# Patient Record
Sex: Male | Born: 1955 | Race: Black or African American | Hispanic: No | Smoking: Former smoker
Health system: Southern US, Community
[De-identification: ages and names within clinical notes are randomized; demographics above are authoritative.]

## PROBLEM LIST (undated history)

## (undated) DIAGNOSIS — M545 Low back pain: Secondary | ICD-10-CM

## (undated) DIAGNOSIS — D126 Benign neoplasm of colon, unspecified: Secondary | ICD-10-CM

## (undated) DIAGNOSIS — E785 Hyperlipidemia, unspecified: Secondary | ICD-10-CM

## (undated) DIAGNOSIS — E119 Type 2 diabetes mellitus without complications: Secondary | ICD-10-CM

## (undated) DIAGNOSIS — I10 Essential (primary) hypertension: Secondary | ICD-10-CM

## (undated) DIAGNOSIS — H269 Unspecified cataract: Secondary | ICD-10-CM

## (undated) DIAGNOSIS — Z5189 Encounter for other specified aftercare: Secondary | ICD-10-CM

## (undated) DIAGNOSIS — M1990 Unspecified osteoarthritis, unspecified site: Secondary | ICD-10-CM

## (undated) DIAGNOSIS — E039 Hypothyroidism, unspecified: Secondary | ICD-10-CM

## (undated) DIAGNOSIS — D649 Anemia, unspecified: Secondary | ICD-10-CM

## (undated) DIAGNOSIS — K219 Gastro-esophageal reflux disease without esophagitis: Secondary | ICD-10-CM

## (undated) DIAGNOSIS — M109 Gout, unspecified: Secondary | ICD-10-CM

## (undated) HISTORY — DX: Hypothyroidism, unspecified: E03.9

## (undated) HISTORY — PX: ANKLE FRACTURE SURGERY: SHX122

## (undated) HISTORY — DX: Gastro-esophageal reflux disease without esophagitis: K21.9

## (undated) HISTORY — PX: COLONOSCOPY: SHX174

## (undated) HISTORY — PX: CYST EXCISION: SHX5701

## (undated) HISTORY — DX: Type 2 diabetes mellitus without complications: E11.9

## (undated) HISTORY — PX: ANKLE SURGERY: SHX546

## (undated) HISTORY — PX: FRACTURE SURGERY: SHX138

## (undated) HISTORY — DX: Unspecified cataract: H26.9

## (undated) HISTORY — DX: Encounter for other specified aftercare: Z51.89

## (undated) HISTORY — DX: Anemia, unspecified: D64.9

## (undated) HISTORY — DX: Benign neoplasm of colon, unspecified: D12.6

## (undated) HISTORY — DX: Low back pain: M54.5

## (undated) HISTORY — DX: Essential (primary) hypertension: I10

## (undated) HISTORY — DX: Hyperlipidemia, unspecified: E78.5

---

## 2010-12-02 ENCOUNTER — Inpatient Hospital Stay (INDEPENDENT_AMBULATORY_CARE_PROVIDER_SITE_OTHER)
Admission: RE | Admit: 2010-12-02 | Discharge: 2010-12-02 | Disposition: A | Discharge status: Home / Self Care | Payer: BC Managed Care – PPO | Source: Ambulatory Visit | Attending: Emergency Medicine | Admitting: Emergency Medicine

## 2010-12-02 DIAGNOSIS — R209 Unspecified disturbances of skin sensation: Secondary | ICD-10-CM

## 2010-12-02 DIAGNOSIS — I10 Essential (primary) hypertension: Secondary | ICD-10-CM

## 2010-12-06 ENCOUNTER — Emergency Department (HOSPITAL_COMMUNITY): Payer: BC Managed Care – PPO

## 2010-12-06 ENCOUNTER — Emergency Department (HOSPITAL_COMMUNITY)
Admission: EM | Admit: 2010-12-06 | Discharge: 2010-12-06 | Disposition: A | Discharge status: Home / Self Care | Payer: BC Managed Care – PPO | Attending: Emergency Medicine | Admitting: Emergency Medicine

## 2010-12-06 DIAGNOSIS — K921 Melena: Secondary | ICD-10-CM | POA: Insufficient documentation

## 2010-12-06 DIAGNOSIS — M545 Low back pain, unspecified: Secondary | ICD-10-CM | POA: Insufficient documentation

## 2010-12-06 DIAGNOSIS — Z8639 Personal history of other endocrine, nutritional and metabolic disease: Secondary | ICD-10-CM | POA: Insufficient documentation

## 2010-12-06 DIAGNOSIS — I10 Essential (primary) hypertension: Secondary | ICD-10-CM | POA: Insufficient documentation

## 2010-12-06 DIAGNOSIS — R5381 Other malaise: Secondary | ICD-10-CM | POA: Insufficient documentation

## 2010-12-06 DIAGNOSIS — K5900 Constipation, unspecified: Secondary | ICD-10-CM | POA: Insufficient documentation

## 2010-12-06 DIAGNOSIS — E785 Hyperlipidemia, unspecified: Secondary | ICD-10-CM | POA: Insufficient documentation

## 2010-12-06 DIAGNOSIS — Z79899 Other long term (current) drug therapy: Secondary | ICD-10-CM | POA: Insufficient documentation

## 2010-12-06 DIAGNOSIS — Z862 Personal history of diseases of the blood and blood-forming organs and certain disorders involving the immune mechanism: Secondary | ICD-10-CM | POA: Insufficient documentation

## 2010-12-06 DIAGNOSIS — K648 Other hemorrhoids: Secondary | ICD-10-CM | POA: Insufficient documentation

## 2010-12-06 DIAGNOSIS — R51 Headache: Secondary | ICD-10-CM | POA: Insufficient documentation

## 2010-12-06 DIAGNOSIS — R209 Unspecified disturbances of skin sensation: Secondary | ICD-10-CM | POA: Insufficient documentation

## 2010-12-06 LAB — DIFFERENTIAL
Eosinophils Absolute: 0.4 10*3/uL (ref 0.0–0.7)
Eosinophils Relative: 3 % (ref 0–5)
Lymphs Abs: 3.9 10*3/uL (ref 0.7–4.0)
Monocytes Relative: 4 % (ref 3–12)

## 2010-12-06 LAB — CBC
MCH: 29.1 pg (ref 26.0–34.0)
MCV: 85.8 fL (ref 78.0–100.0)
Platelets: 265 10*3/uL (ref 150–400)
RDW: 15 % (ref 11.5–15.5)

## 2010-12-06 LAB — BASIC METABOLIC PANEL
Calcium: 9.5 mg/dL (ref 8.4–10.5)
Creatinine, Ser: 1.15 mg/dL (ref 0.50–1.35)
GFR calc Af Amer: 82 mL/min — ABNORMAL LOW (ref >90)

## 2010-12-06 MED ORDER — GADOBENATE DIMEGLUMINE 529 MG/ML IV SOLN
20.0000 mL | Freq: Once | INTRAVENOUS | Status: AC
  Administered 2010-12-06: 20 mL via INTRAVENOUS

## 2011-09-07 ENCOUNTER — Other Ambulatory Visit: Payer: Self-pay | Admitting: Physician Assistant

## 2011-09-07 ENCOUNTER — Ambulatory Visit
Admission: RE | Admit: 2011-09-07 | Discharge: 2011-09-07 | Disposition: A | Discharge status: Home / Self Care | Payer: BC Managed Care – PPO | Source: Ambulatory Visit | Attending: Physician Assistant | Admitting: Physician Assistant

## 2011-09-07 DIAGNOSIS — R52 Pain, unspecified: Secondary | ICD-10-CM

## 2013-05-13 ENCOUNTER — Encounter (HOSPITAL_COMMUNITY): Payer: Self-pay | Admitting: Emergency Medicine

## 2013-05-13 ENCOUNTER — Emergency Department (HOSPITAL_COMMUNITY): Payer: BC Managed Care – PPO

## 2013-05-13 ENCOUNTER — Emergency Department (HOSPITAL_COMMUNITY)
Admission: EM | Admit: 2013-05-13 | Discharge: 2013-05-13 | Disposition: A | Discharge status: Home / Self Care | Payer: Self-pay | Attending: Emergency Medicine | Admitting: Emergency Medicine

## 2013-05-13 DIAGNOSIS — I10 Essential (primary) hypertension: Secondary | ICD-10-CM | POA: Insufficient documentation

## 2013-05-13 DIAGNOSIS — N508 Other specified disorders of male genital organs: Secondary | ICD-10-CM | POA: Insufficient documentation

## 2013-05-13 DIAGNOSIS — N503 Cyst of epididymis: Secondary | ICD-10-CM

## 2013-05-13 DIAGNOSIS — Z87891 Personal history of nicotine dependence: Secondary | ICD-10-CM | POA: Insufficient documentation

## 2013-05-13 DIAGNOSIS — G8929 Other chronic pain: Secondary | ICD-10-CM | POA: Insufficient documentation

## 2013-05-13 DIAGNOSIS — M549 Dorsalgia, unspecified: Secondary | ICD-10-CM | POA: Insufficient documentation

## 2013-05-13 DIAGNOSIS — M109 Gout, unspecified: Secondary | ICD-10-CM | POA: Insufficient documentation

## 2013-05-13 DIAGNOSIS — Z79899 Other long term (current) drug therapy: Secondary | ICD-10-CM | POA: Insufficient documentation

## 2013-05-13 HISTORY — DX: Essential (primary) hypertension: I10

## 2013-05-13 HISTORY — DX: Unspecified osteoarthritis, unspecified site: M19.90

## 2013-05-13 HISTORY — DX: Gout, unspecified: M10.9

## 2013-05-13 LAB — URINE MICROSCOPIC-ADD ON

## 2013-05-13 LAB — URINALYSIS, ROUTINE W REFLEX MICROSCOPIC
BILIRUBIN URINE: NEGATIVE
Glucose, UA: NEGATIVE mg/dL
HGB URINE DIPSTICK: NEGATIVE
Ketones, ur: NEGATIVE mg/dL
Nitrite: NEGATIVE
PH: 5.5 (ref 5.0–8.0)
Protein, ur: NEGATIVE mg/dL
SPECIFIC GRAVITY, URINE: 1.027 (ref 1.005–1.030)
UROBILINOGEN UA: 1 mg/dL (ref 0.0–1.0)

## 2013-05-13 MED ORDER — HYDROCODONE-ACETAMINOPHEN 5-325 MG PO TABS
1.0000 | ORAL_TABLET | Freq: Once | ORAL | Status: AC
  Administered 2013-05-13: 1 via ORAL
  Filled 2013-05-13: qty 1

## 2013-05-13 MED ORDER — HYDROCODONE-ACETAMINOPHEN 5-325 MG PO TABS: 1.0000 | ORAL_TABLET | Freq: Four times a day (QID) | ORAL | Status: DC | PRN

## 2013-05-13 NOTE — ED Provider Notes (Signed)
CSN: [id number]     Arrival date & time 05/13/13  1022 History   First MD Initiated Contact with Patient 05/13/13 1316     Chief Complaint  Patient presents with  . Groin Swelling  . Back Pain     (Consider location/radiation/quality/duration/timing/severity/associated sxs/prior Treatment) Patient is a 58 y.o. male presenting with back pain.  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   He has also had 3 months of moderate aching pain to L testicle, tender to palpation and feels swollen. He had a 'cyst' removed from R testicle in the past. Denies trauma, dysuria, hematuria, penile discharge or sores.   Past Medical History  Diagnosis Date  . Hypertension   . Gout   . Arthritis    Past Surgical History  Procedure Laterality Date  . Cyst excision    . Fracture surgery     No family history on file. History  Substance Use Topics  . Smoking status: Former Smoker    Quit date: 05/14/1983  . Smokeless tobacco: Not on file  . Alcohol Use: Yes    Review of Systems  Musculoskeletal: Positive for back pain.   All other systems reviewed and are negative except as noted in HPI.     Allergies  Review of patient's allergies indicates no known allergies.  Home Medications   Current Outpatient Rx  Name  Route  Sig  Dispense  Refill  . allopurinol (ZYLOPRIM) 300 MG tablet   Oral   Take 300 mg by mouth daily.         Marland Kitchen amLODipine (NORVASC) 5 MG tablet   Oral   Take 5 mg by mouth daily.         . Aspirin-Salicylamide-Caffeine (BC HEADACHE POWDER PO)   Oral   Take 1-2 packets by mouth 3 (three) times daily as needed (for pain).         Marland Kitchen levothyroxine (SYNTHROID, LEVOTHROID) 150 MCG tablet   Oral   Take 150 mcg by mouth daily before breakfast.         .  lisinopril (PRINIVIL,ZESTRIL) 20 MG tablet   Oral   Take 20 mg by mouth daily.         . meloxicam (MOBIC) 15 MG tablet   Oral   Take 15 mg by mouth daily.         . metFORMIN (GLUCOPHAGE) 500 MG tablet   Oral   Take 500 mg by mouth 2 (two) times daily with a meal.         . vitamin B-12 (CYANOCOBALAMIN) 1000 MCG tablet   Oral   Take 1,000 mcg by mouth daily.          BP 157/102  Pulse 75  Temp(Src) 98.2 F (36.8 C) (Oral)  Resp 20  SpO2 98% Physical Exam  Nursing note and vitals reviewed. Constitutional: He is oriented to person, place, and time. He appears well-developed and well-nourished.  HENT:  Head: Normocephalic and atraumatic.  Eyes: EOM are normal. Pupils are equal, round, and reactive to light.  Neck: Normal range of motion. Neck supple.  Cardiovascular: Normal rate, normal heart sounds and intact distal pulses.   Pulmonary/Chest: Effort normal and breath sounds normal.  Abdominal: Bowel sounds are normal. He exhibits no distension. There is no tenderness.  Genitourinary:  No lymphadenopathy, no penile discharge or sores, he has mild swelling and tenderness just superior to L testicle, ?varicocele vs epididymitis.   Musculoskeletal: Normal range of motion. He exhibits no edema and no tenderness.  Neurological: He is alert and oriented to person, place, and time. He has normal strength. No cranial nerve deficit or sensory deficit.  Skin: Skin is warm and dry. No rash noted.  Psychiatric: He has a normal mood and affect.    ED Course  Procedures (including critical care time) Labs Review Labs Reviewed  URINALYSIS, ROUTINE W REFLEX MICROSCOPIC - Abnormal; Notable for the following:    Leukocytes, UA TRACE (*)    All other components within normal limits  URINE MICROSCOPIC-ADD ON   Imaging Review US Scrotum  05/13/2013   CLINICAL DATA:  testicular pain, L>R  EXAM: SCROTAL ULTRASOUND  DOPPLER ULTRASOUND OF THE TESTICLES  TECHNIQUE: Complete ultrasound  examination of the testicles, epididymis, and other scrotal structures was performed. Color and spectral Doppler ultrasound were also utilized to evaluate blood flow to the testicles.  COMPARISON:  None.  FINDINGS: Right testicle  Measurements: 4.9 x 2 x 3.6 cm. No mass or microlithiasis visualized.  Left testicle  Measurements: 4.3 x 1.9 x 3.3 cm. No mass or microlithiasis visualized.  Right epididymis:  Normal in size and appearance.  Left epididymis: A 0.5 x 0.3 x 0.5 cm small cystic focus is identified within the left epididymis. Internal echoes with a layering appearance are appreciated, there is no appreciable vascularity associated with this finding. Differential considerations are is small complex cyst versus a small spermatocele. The left epididymis is otherwise unremarkable.  Hydrocele: Bilateral hydroceles are identified small to moderate on the right, small on the left.  Varicocele:  A varicocele is elicited on the right.  Pulsed Doppler interrogation of both testes demonstrates low resistance arterial and venous waveforms bilaterally.  IMPRESSION: 1. Small cystic appearing focus within the left epididymis differential considerations are a small partially complex cyst versus a small spermatocele. 2. Bilateral hydroceles right greater than left 3. Varicocele appreciated on the right. 4. No further sonographic abnormalities.   Electronically Signed   By: Margaree Mackintosh M.D.   On: 05/13/2013 14:58   [place] Art/ven Flow Abd Pelv Doppler  05/13/2013   CLINICAL DATA:  testicular pain, L>R  EXAM: SCROTAL ULTRASOUND  DOPPLER ULTRASOUND OF THE TESTICLES  TECHNIQUE: Complete ultrasound examination of the testicles, epididymis, and other scrotal structures was performed. Color and spectral Doppler ultrasound were also utilized to evaluate blood flow to the testicles.  COMPARISON:  None.  FINDINGS: Right testicle  Measurements: 4.9 x 2 x 3.6 cm. No mass or microlithiasis visualized.  Left testicle  Measurements: 4.3  x 1.9 x 3.3 cm. No mass or microlithiasis visualized.  Right epididymis:  Normal in size and appearance.  Left epididymis: A 0.5 x 0.3 x 0.5 cm small cystic focus is identified within the left epididymis. Internal echoes with a layering appearance are appreciated, there is no appreciable vascularity associated with this finding. Differential considerations are is small complex cyst versus a small spermatocele. The left epididymis is otherwise unremarkable.  Hydrocele: Bilateral hydroceles are identified small to moderate on the right, small on the left.  Varicocele:  A varicocele is elicited on the right.  Pulsed Doppler interrogation of both testes demonstrates low resistance arterial and venous waveforms bilaterally.  IMPRESSION: 1. Small cystic appearing focus within the left epididymis differential considerations are a small partially complex cyst versus a  small spermatocele. 2. Bilateral hydroceles right greater than left 3. Varicocele appreciated on the right. 4. No further sonographic abnormalities.   Electronically Signed   By: Margaree Mackintosh M.D.   On: 05/13/2013 14:58     EKG Interpretation None      MDM   Final diagnoses:  Chronic back pain  Epididymal cyst   Labs and imaging reviewed with patient. Advised Urology followup for cyst and varicocele. PCP followup for back pain.   Ruhani Umland B. Karle Starch, MD 05/13/13 [phone]

## 2013-05-13 NOTE — Discharge Instructions (Signed)
Back Pain, Adult Low back pain is very common. About 1 in 5 people have back pain.The cause of low back pain is rarely dangerous. The pain often gets better over time.About half of people with a sudden onset of back pain feel better in just 2 weeks. About 8 in 10 people feel better by 6 weeks.  CAUSES Some common causes of back pain include:  Strain of the muscles or ligaments supporting the spine.  Wear and tear (degeneration) of the spinal discs.  Arthritis.  Direct injury to the back. DIAGNOSIS Most of the time, the direct cause of low back pain is not known.However, back pain can be treated effectively even when the exact cause of the pain is unknown.Answering your caregiver's questions about your overall health and symptoms is one of the most accurate ways to make sure the cause of your pain is not dangerous. If your caregiver needs more information, he or she may order lab work or imaging tests (X-rays or MRIs).However, even if imaging tests show changes in your back, this usually does not require surgery. HOME CARE INSTRUCTIONS For many people, back pain returns.Since low back pain is rarely dangerous, it is often a condition that people can learn to [facility] their own.   Remain active. It is stressful on the back to sit or stand in one place. Do not sit, drive, or stand in one place for more than 30 minutes at a time. Take short walks on level surfaces as soon as pain allows.Try to increase the length of time you walk each day.  Do not stay in bed.Resting more than 1 or 2 days can delay your recovery.  Do not avoid exercise or work.Your body is made to move.It is not dangerous to be active, even though your back may hurt.Your back will likely heal faster if you return to being active before your pain is gone.  Pay attention to your body when you bend and lift. Many people have less discomfortwhen lifting if they bend their knees, keep the load close to their bodies,and  avoid twisting. Often, the most comfortable positions are those that put less stress on your recovering back.  Find a comfortable position to sleep. Use a firm mattress and lie on your side with your knees slightly bent. If you lie on your back, put a pillow under your knees.  Only take over-the-counter or prescription medicines as directed by your caregiver. Over-the-counter medicines to reduce pain and inflammation are often the most helpful.Your caregiver may prescribe muscle relaxant drugs.These medicines help dull your pain so you can more quickly return to your normal activities and healthy exercise.  Put ice on the injured area.  Put ice in a plastic bag.  Place a towel between your skin and the bag.  Leave the ice on for 15-20 minutes, 03-04 times a day for the first 2 to 3 days. After that, ice and heat may be alternated to reduce pain and spasms.  Ask your caregiver about trying back exercises and gentle massage. This may be of some benefit.  Avoid feeling anxious or stressed.Stress increases muscle tension and can worsen back pain.It is important to recognize when you are anxious or stressed and learn ways to manage it.Exercise is a great option. SEEK MEDICAL CARE IF:  You have pain that is not relieved with rest or medicine.  You have pain that does not improve in 1 week.  You have new symptoms.  You are generally not feeling well. SEEK  IMMEDIATE MEDICAL CARE IF:   You have pain that radiates from your back into your legs.  You develop new bowel or bladder control problems.  You have unusual weakness or numbness in your arms or legs.  You develop nausea or vomiting.  You develop abdominal pain.  You feel faint. Document Released: 02/12/2005 Document Revised: 08/14/2011 Document Reviewed: 07/03/2010 [facility] Patient Information 2014 [place], [place].  Scrotal Masses Scrotal swelling is common in men of all ages. Common types of testicular masses include:    Hydrocele. The most common benign testicular mass in an adult. Hydroceles are generally soft and painless collections of fluid in the scrotal sac. These can rapidly change size as the fluid enters or leaves. Hydroceles can be associated with an underlying cancer of the testicle.  Spermatoceles. Generally soft and painless cyst-like masses in the scrotum that contain fluid, usually above the testicle. They can rapidly change size as the fluid enters or leaves. They are more prominent while standing or exercising. Sometimes, spermatoceles may cause a sensation of heaviness or a dull ache.  Orchitis. Inflammation of the testicle. It is painful and may be associated with a fever or symptoms of a urinary tract infection, including frequent and painful urination. It is common in males who have the mumps.  Varicocele. An enlargement of the veins that drain the testicles. Varicoceles usually occur on the left side of the scrotum. This condition can increase the risk of infertility. Varicocele is sometimes more prominent while standing or exercising. Sometimes, varicoceles may cause a sensation of heaviness or a dull ache.  Inguinal hernia. A bulge caused by a portion of intestine protruding into the scrotum through a weak area in the abdominal muscles. Hernias may or may not be painful. They are soft and usually enlarge with coughing or straining.  Torsion of the testis. This can cause a testicular mass that develops quickly and is associated with tenderness or fever, or both. It is caused by a twisting of the testicle within the sac. It also reduces the blood supply and can destroy the testis if not treated quickly with surgery.  Epididymitis. Inflammation of the epididymis (a structure attached above and behind the testicle), usually caused by a urinary tract infection or a sexually transmitted infection. This generally shows up as testicular discomfort and swelling and may include pain during urination.  It is frequently associated with a testicle infection.  Testicular appendages. Remnants of tissue on the testis present since birth. A testicular appendage can twist on its blood supply and cause pain. In most cases, this is seen as a blue dot on the scrotum.  Hematocele. A collection of blood between the layers of the sac inside the scrotum. It usually is caused by trauma to the scrotum.  Sebaceous cysts. These can be a swelling in the skin of the scrotum and are usually painless.  Cancer (carcinoma) of the skin of the scrotum. It can cause scrotal swelling, but this is rare. Document Released: 08/19/2002 Document Revised: 10/15/2012 Document Reviewed: 08/04/2012 [facility] Patient Information 2014 [place].

## 2013-05-13 NOTE — ED Notes (Addendum)
Pt states he's had lower back pain and testicle swelling "for a while".  Pt doesn't give a time frame, but states it's been going on a while.  Pt sees a PCP for these same symptoms.  Pt given Rx for Meloxicam, but no relief of back pain.  Pt states PCP knows about his testicle swelling, but has not addressed it.  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

## 2013-05-13 NOTE — ED Notes (Signed)
Pt states he is allergic to a pain pill, but does not remember what it is.

## 2013-05-24 ENCOUNTER — Emergency Department (HOSPITAL_COMMUNITY)
Admission: EM | Admit: 2013-05-24 | Discharge: 2013-05-24 | Disposition: A | Discharge status: Home / Self Care | Payer: No Typology Code available for payment source | Source: Home / Self Care | Attending: Family Medicine | Admitting: Family Medicine

## 2013-05-24 ENCOUNTER — Emergency Department (INDEPENDENT_AMBULATORY_CARE_PROVIDER_SITE_OTHER): Payer: No Typology Code available for payment source

## 2013-05-24 ENCOUNTER — Other Ambulatory Visit (HOSPITAL_COMMUNITY)
Admission: RE | Admit: 2013-05-24 | Discharge: 2013-05-24 | Disposition: A | Discharge status: Home / Self Care | Payer: No Typology Code available for payment source | Source: Ambulatory Visit | Attending: Family Medicine | Admitting: Family Medicine

## 2013-05-24 DIAGNOSIS — M79606 Pain in leg, unspecified: Secondary | ICD-10-CM

## 2013-05-24 DIAGNOSIS — Z113 Encounter for screening for infections with a predominantly sexual mode of transmission: Secondary | ICD-10-CM | POA: Insufficient documentation

## 2013-05-24 DIAGNOSIS — N509 Disorder of male genital organs, unspecified: Secondary | ICD-10-CM

## 2013-05-24 DIAGNOSIS — M79609 Pain in unspecified limb: Secondary | ICD-10-CM

## 2013-05-24 DIAGNOSIS — M549 Dorsalgia, unspecified: Secondary | ICD-10-CM

## 2013-05-24 DIAGNOSIS — N50819 Testicular pain, unspecified: Secondary | ICD-10-CM

## 2013-05-24 MED ORDER — CEFTRIAXONE SODIUM 250 MG IJ SOLR
250.0000 mg | Freq: Once | INTRAMUSCULAR | Status: AC
  Administered 2013-05-24: 250 mg via INTRAMUSCULAR

## 2013-05-24 MED ORDER — LIDOCAINE HCL (PF) 1 % IJ SOLN
INTRAMUSCULAR | Status: AC
  Filled 2013-05-24: qty 5

## 2013-05-24 MED ORDER — AZITHROMYCIN 250 MG PO TABS
ORAL_TABLET | ORAL | Status: AC
  Filled 2013-05-24: qty 4

## 2013-05-24 MED ORDER — AZITHROMYCIN 250 MG PO TABS
1000.0000 mg | ORAL_TABLET | Freq: Once | ORAL | Status: AC
  Administered 2013-05-24: 1000 mg via ORAL

## 2013-05-24 MED ORDER — CEFTRIAXONE SODIUM 250 MG IJ SOLR
INTRAMUSCULAR | Status: AC
  Filled 2013-05-24: qty 250

## 2013-05-24 MED ORDER — HYDROCODONE-ACETAMINOPHEN 5-325 MG PO TABS: 1.0000 | ORAL_TABLET | Freq: Four times a day (QID) | ORAL | Status: DC | PRN

## 2013-05-24 NOTE — ED Provider Notes (Signed)
Ray Garner is a 58 y.o. male who presents to Urgent Care today for back pain, leg pain, and scrotum pain. Patient has multiple issues. 1) back pain ongoing for months. Pain is present in the low back radiating to the Botox bilaterally. No weakness or numbness. He has tried multiple over-the-counter medications which have not helped. He has an appointment with a new provider care Dr. in May.  No fevers or chills nausea vomiting or diarrhea. No significant trouble walking or bowel bladder dysfunction. 2) testicle pain: Patient is left-sided testicle pain. This is been ongoing for several weeks. He was seen in the emergency room and had an ultrasound which showed a cystic structure. He continues to have severe discomfort. He denies any penile discharge. He was asked to followup with a urologist however he does not have health insurance and has been unable to do so. 3) leg pain: Patient has bilateral calf pain that is worse with exertion and better with rest. He can walk approximately 100 feet before he has to rest for a few minutes. He denies any significant rest pain.   Past Medical History  Diagnosis Date  . Hypertension   . Gout   . Arthritis    History  Substance Use Topics  . Smoking status: Former Smoker    Quit date: 05/14/1983  . Smokeless tobacco: Not on file  . Alcohol Use: Yes   ROS as above Medications: No current facility-administered medications for this encounter.   Current Outpatient Prescriptions  Medication Sig Dispense Refill  . allopurinol (ZYLOPRIM) 300 MG tablet Take 300 mg by mouth daily.      Marland Kitchen amLODipine (NORVASC) 5 MG tablet Take 5 mg by mouth daily.      . Aspirin-Salicylamide-Caffeine (BC HEADACHE POWDER PO) Take 1-2 packets by mouth 3 (three) times daily as needed (for pain).      Marland Kitchen HYDROcodone-acetaminophen (NORCO/VICODIN) 5-325 MG per tablet Take 1-2 tablets by mouth every 6 (six) hours as needed for moderate pain.  15 tablet  0  . levothyroxine (SYNTHROID,  LEVOTHROID) 150 MCG tablet Take 150 mcg by mouth daily before breakfast.      . lisinopril (PRINIVIL,ZESTRIL) 20 MG tablet Take 20 mg by mouth daily.      . meloxicam (MOBIC) 15 MG tablet Take 15 mg by mouth daily.      . metFORMIN (GLUCOPHAGE) 500 MG tablet Take 500 mg by mouth 2 (two) times daily with a meal.      . vitamin B-12 (CYANOCOBALAMIN) 1000 MCG tablet Take 1,000 mcg by mouth daily.        Exam:  BP 146/98  Pulse 73  Temp(Src) 98.3 F (36.8 C) (Oral)  Resp 16  SpO2 100% Gen: Well NAD HEENT: EOMI,  MMM Lungs: Normal work of breathing. CTABL Heart: RRR no MRG Abd: NABS, Soft. NT, ND Exts: Brisk capillary refill, warm and well perfused.  Back: Nontender to spinal midline. Normal back range of motion. Negative straight leg raise test Corky Sox test and Fadir test bilaterally. Lower extremity strength is intact throughout. Capillary refill pulses and reflexes are normal. Normal gait Posterior tibialis and dorsal pedis pulses are intact bilaterally. Genitals: Testicles descended bilaterally. Left testicle is diffusely very mildly tender with no masses palpated. No nodules noted. No penile discharge.  No results found for this or any previous visit (from the past 24 hour(s)). Dg Lumbar Spine Complete  05/24/2013   CLINICAL DATA:  Low back pain.  EXAM: LUMBAR SPINE - COMPLETE 4+ VIEW  COMPARISON:  09/07/2011  FINDINGS: There is an old compression fracture involving the superior endplate of Z61. Alignment of the lumbar spine is normal. There is mild disc space narrowing at L5-S1. No evidence for a pars defect. Oval-shaped density along the right side of the L3 vertebral body on the AP view is probably within bowel.  IMPRESSION: No acute bone abnormality.  Old T12 compression fracture.  Mild disc space narrowing at L5-S1.   Electronically Signed   By: Markus Daft M.D.   On: 05/24/2013 14:38    Assessment and Plan: 58 y.o. male with  1) back pain: Likely DDD related. Patient may have a  radicular component as well. He's had steroid treatments in the past which helped. Ultimately he'll need to lose weight and work on back exercises. Followup with primary care provider. Short prescription of Norco. Patient was informed that we'll not continue provide pain medications at this location.  2) leg pain: The patient's history is concerning for claudication. He does have good pulses bilaterally. I placed an order for a lower extremity vascular ultrasound. 3) testicle pain: Ultrasound showed a cyst recently. Urine cytology for gonorrhea Chlamydia Trichomonas is pending.  Empiric Treatment with ceftriaxone and azithromycin.  Discussed warning signs or symptoms. Please see discharge instructions. Patient expresses understanding.    Gregor Hams, MD 05/24/13 [phone]

## 2013-05-24 NOTE — Discharge Instructions (Signed)
Thank you for coming in today. You should be called tomorrow about scheduling an ultrasound for her lower legs. If you do not hear from them please call hospital [phone]) and ask to speak to vascular ultrasound scheduling.  Please call or see Ms Cheryle Horsfall for assistance with your bill.  You may qualify for reduced or free services.  Her phone number is [phone]. Her email is [email or web address]  We can try to get you referred to [facility].  Come back or go to the emergency room if you notice new weakness new numbness problems walking or bowel or bladder problems.   Intermittent Claudication Blockage of leg arteries results from poor circulation of blood in the leg arteries. This produces an aching, tired, and sometimes burning pain in the legs that is brought on by exercise and made better by rest. Claudication refers to the limping that happens from leg cramps. It is also referred to as Vaso-occlusive disease of the legs, arterial insufficiency of the legs, recurrent leg pain, recurrent leg cramping and calf pain with exercise.  CAUSES  This condition is due to narrowing or blockage of the arteries (muscular vessels which carry blood away from the heart and around the body). Blockage of arteries can occur anywhere in the body. If they occur in the heart, a person may experience angina (chest pain) or even a heart attack. If they occur in the neck or the brain, a person may have a stroke. Intermittent claudication is when the blockage occurs in the legs, most commonly in the calf or the foot.  Atherosclerosis, or blockage of arteries, can occur for many reasons. Some of these are smoking, diabetes, and high cholesterol. SYMPTOMS  Intermittent claudication may occur in both legs, and it often continues to get worse over time. However, some people complain only of weakness in the legs when walking, or a feeling of "tiredness" in the buttocks. Impotence (not able  to have an erection) is an occasional complaint in men. Pain while resting is uncommon.  WHAT TO EXPECT AT [facility] PROVIDER'S OFFICE: Your medical history will be asked for and a physical examination will be performed. Medical history questions documenting claudication in detail may include:   Time pattern  Do you have leg cramps at night (nocturnal cramps)?  How often does leg pain with cramping occur?  Is it getting worse?  What is the quality of the pain?  Is the pain sharp?  Is there an aching pain with the cramps?  Aggravating factors  Is it worse after you exercise?  Is it worse after you are standing for a while?  Do you smoke? How much?  Do you drink alcohol? How much?  Are you diabetic? How well is your blood sugar controlled?  Other  What other symptoms are also present?  Has there been impotence (men)?  Is there pain in the back?  Is there a darkening of the skin of the legs, feet or toes?  Is there weakness or paralysis of the legs? The physical examination may include evaluation of the femoral pulse (in the groin) and the other areas where the pulse can be felt in the legs. DIAGNOSIS  Diagnostic tests that may be performed include:  Blood pressure measured in arms and legs for comparison.  Doppler ultrasonography on the legs and the heart.  Duplex Doppler/ultrasound exam of extremity to visualize arterial blood flow.  ECG- to evaluate the activity of your heart.  Aortography- to visualize blockages in  your arteries. TREATMENT Surgical treatment may be suggested if claudication interferes with the patient's activities or work, and if the diseased arteries do not seem to be improving after treatment. Be aware that this condition can worsen over time and you should carefully monitor your condition. HOME CARE INSTRUCTIONS  Talk to your caregiver about the cause of your leg cramping and about what to do at home to relieve it.  A healthy  diet is important to lessen the likeliness of atherosclerosis.  A program of daily walking for short periods, and stopping for pain or cramping, may help improve function.  It is important to stop smoking.  Avoid putting hot or cold items on legs.  Avoid tight shoes. SEEK MEDICAL CARE IF: There are many other causes of leg pain such as arthritis or low blood potassium. However, some causes of leg pain may be life threatening such as a blood clot in the legs. Seek medical attention if you have:  Leg pain that does not go away.  Legs that may be red, hot or swollen.  Ulcers or sores appear on your ankle or foot.  Any chest pain or shortness of breath accompanying leg pain.  Diabetes.  You are pregnant. SEEK IMMEDIATE MEDICAL CARE IF:   Your leg pain becomes severe or will not go away.  Your foot turns blue or a dark color.  Your leg becomes red, hot or swollen or you develop a fever over 102F.  Any chest pain or shortness of breath accompanying leg pain. MAKE SURE YOU:   Understand these instructions.  Will watch your condition.  Will get help right away if you are not doing well or get worse. Document Released: 12/16/2003 Document Revised: 05/07/2011 Document Reviewed: 10/03/2007 [facility] Patient Information 2014 [place].

## 2013-05-24 NOTE — ED Notes (Signed)
Patient here for back pain and hip pain Has a cyst on his scrotum was seen recently in the ED for it States that gave him pain medications but stopped taking it because  It make him break out in hives

## 2013-05-25 LAB — URINE CYTOLOGY ANCILLARY ONLY
CHLAMYDIA, DNA PROBE: NEGATIVE
Neisseria Gonorrhea: NEGATIVE
Trichomonas: POSITIVE — AB

## 2013-05-26 ENCOUNTER — Telehealth (HOSPITAL_COMMUNITY): Payer: Self-pay | Admitting: *Deleted

## 2013-05-27 MED ORDER — METRONIDAZOLE 500 MG PO TABS: 500.0000 mg | ORAL_TABLET | Freq: Two times a day (BID) | ORAL | Status: DC

## 2013-05-27 NOTE — Telephone Encounter (Signed)
Message copied by Gregor Hams on Wed May 27, 2013  8:15 AM ------      Message from: Roselyn Meier      Created: Mon May 25, 2013 10:42 PM      Regarding: labs       Trich pos. Rest of labs neg.  Needs treatment.      Roselyn Meier      05/25/2013       ------

## 2013-05-27 NOTE — Telephone Encounter (Signed)
Message copied by Gregor Hams on Wed May 27, 2013  8:16 AM ------      Message from: Roselyn Meier      Created: Mon May 25, 2013 10:42 PM      Regarding: labs       Trich pos. Rest of labs neg.  Needs treatment.      Roselyn Meier      05/25/2013       ------

## 2013-05-27 NOTE — ED Notes (Signed)
Pt. Called back.  Pt. verified x 2 and given results.  Pt. Told he needs Flagyl for Trich and where to pick up his Rx.  Pt. instructed to no alcohol while taking this medication.  Pt. instructed  to notify his partner to be treated with Flagyl, no sex until he have finished his medication and his partner has been treated and to practice safe sex. Pt. told that he can get HIV testing at the [facility] clinic, by appointment. Pt.'s fiance called back and said he could not explain what he had.  She put him on the phone and he gave me permission to tell her.  I told her that he had Trich.  She said she know what that is and did not want any more information. Roselyn Meier 05/27/2013

## 2013-05-27 NOTE — ED Notes (Signed)
PT test pos for trich.  Flagyl Called in. RN to contact the pt.   Gregor Hams, MD 05/27/13 [phone]

## 2013-06-24 ENCOUNTER — Telehealth: Payer: Self-pay | Admitting: Internal Medicine

## 2013-06-24 NOTE — Telephone Encounter (Signed)
Pt called in today requesting an earlier appointment due to his feet being swollen and changing colors; Pt stated that he is diabeticand pt was transferred to nurse and was advised to come in for a visit however pt stated that they had to go to work; pt was then put in for a scheduled appointment 4/30 @ 2:00

## 2013-06-24 NOTE — Telephone Encounter (Signed)
Patient's wife called stating that husband has an appointment for [id number] to establish care. Patient's wife states her husband needs an earlier appointment because he is having swelling in his feet and his skin is turning reddish/black. Informed the patient's wife he needs to come in today to see provider. Patient's wife states husband needs to go to work at 4 PM and will not be able to come in today. Informed patient's wife if he gets worse at work then he will need to go to the ER. Scheduled patient an appointment for [id number] at 2:00 PM. Patient's wife verbalizes understanding. Alverda Skeans, RN

## 2013-06-25 ENCOUNTER — Ambulatory Visit: Payer: No Typology Code available for payment source | Admitting: Family Medicine

## 2013-06-25 ENCOUNTER — Ambulatory Visit: Payer: No Typology Code available for payment source | Admitting: Internal Medicine

## 2013-06-30 ENCOUNTER — Ambulatory Visit: Payer: No Typology Code available for payment source | Attending: Internal Medicine | Admitting: Internal Medicine

## 2013-06-30 ENCOUNTER — Encounter: Payer: Self-pay | Admitting: Internal Medicine

## 2013-06-30 VITALS — BP 151/92 | HR 85 | Temp 99.0°F | Resp 16 | Ht 72.0 in | Wt 256.0 lb

## 2013-06-30 DIAGNOSIS — M545 Low back pain, unspecified: Secondary | ICD-10-CM

## 2013-06-30 DIAGNOSIS — E039 Hypothyroidism, unspecified: Secondary | ICD-10-CM | POA: Insufficient documentation

## 2013-06-30 DIAGNOSIS — M109 Gout, unspecified: Secondary | ICD-10-CM

## 2013-06-30 DIAGNOSIS — N442 Benign cyst of testis: Secondary | ICD-10-CM

## 2013-06-30 DIAGNOSIS — E119 Type 2 diabetes mellitus without complications: Secondary | ICD-10-CM

## 2013-06-30 DIAGNOSIS — I10 Essential (primary) hypertension: Secondary | ICD-10-CM

## 2013-06-30 HISTORY — DX: Hypothyroidism, unspecified: E03.9

## 2013-06-30 HISTORY — DX: Type 2 diabetes mellitus without complications: E11.9

## 2013-06-30 HISTORY — DX: Essential (primary) hypertension: I10

## 2013-06-30 HISTORY — DX: Low back pain, unspecified: M54.50

## 2013-06-30 LAB — LIPID PANEL
CHOL/HDL RATIO: 4.1 ratio
Cholesterol: 146 mg/dL (ref 0–200)
HDL: 36 mg/dL — AB (ref >39)
LDL CALC: 65 mg/dL (ref 0–99)
TRIGLYCERIDES: 225 mg/dL — AB (ref <150)
VLDL: 45 mg/dL — ABNORMAL HIGH (ref 0–40)

## 2013-06-30 LAB — CBC
HCT: 40.6 % (ref 39.0–52.0)
Hemoglobin: 13.8 g/dL (ref 13.0–17.0)
MCH: 29.4 pg (ref 26.0–34.0)
MCHC: 34 g/dL (ref 30.0–36.0)
MCV: 86.4 fL (ref 78.0–100.0)
Platelets: 254 10*3/uL (ref 150–400)
RBC: 4.7 MIL/uL (ref 4.22–5.81)
RDW: 15 % (ref 11.5–15.5)
WBC: 7.2 10*3/uL (ref 4.0–10.5)

## 2013-06-30 LAB — COMPLETE METABOLIC PANEL WITH GFR
ALK PHOS: 66 U/L (ref 39–117)
ALT: 20 U/L (ref 0–53)
AST: 25 U/L (ref 0–37)
Albumin: 4.3 g/dL (ref 3.5–5.2)
BILIRUBIN TOTAL: 0.5 mg/dL (ref 0.2–1.2)
BUN: 10 mg/dL (ref 6–23)
CO2: 26 meq/L (ref 19–32)
CREATININE: 0.95 mg/dL (ref 0.50–1.35)
Calcium: 9.5 mg/dL (ref 8.4–10.5)
Chloride: 105 mEq/L (ref 96–112)
GFR, EST NON AFRICAN AMERICAN: 89 mL/min
GLUCOSE: 134 mg/dL — AB (ref 70–99)
Potassium: 4.1 mEq/L (ref 3.5–5.3)
Sodium: 141 mEq/L (ref 135–145)
Total Protein: 7.3 g/dL (ref 6.0–8.3)

## 2013-06-30 LAB — GLUCOSE, POCT (MANUAL RESULT ENTRY): POC GLUCOSE: 126 mg/dL — AB (ref 70–99)

## 2013-06-30 LAB — TSH: TSH: 2.635 u[IU]/mL (ref 0.350–4.500)

## 2013-06-30 LAB — POCT GLYCOSYLATED HEMOGLOBIN (HGB A1C): Hemoglobin A1C: 6.3

## 2013-06-30 MED ORDER — FAMOTIDINE 20 MG PO TABS: 20.0000 mg | ORAL_TABLET | Freq: Two times a day (BID) | ORAL | Status: DC

## 2013-06-30 MED ORDER — NAPROXEN 500 MG PO TABS: 500.0000 mg | ORAL_TABLET | Freq: Every day | ORAL | Status: DC

## 2013-06-30 MED ORDER — AMLODIPINE BESYLATE 5 MG PO TABS: 5.0000 mg | ORAL_TABLET | Freq: Every day | ORAL | Status: DC

## 2013-06-30 MED ORDER — ALLOPURINOL 300 MG PO TABS: 300.0000 mg | ORAL_TABLET | Freq: Every day | ORAL | Status: DC

## 2013-06-30 MED ORDER — OMEPRAZOLE 40 MG PO CPDR: 40.0000 mg | DELAYED_RELEASE_CAPSULE | Freq: Every day | ORAL | Status: DC

## 2013-06-30 MED ORDER — DICLOFENAC SODIUM 1 % TD GEL: 4.0000 g | Freq: Four times a day (QID) | TRANSDERMAL | Status: DC

## 2013-06-30 MED ORDER — LISINOPRIL 20 MG PO TABS: 20.0000 mg | ORAL_TABLET | Freq: Every day | ORAL | Status: DC

## 2013-06-30 MED ORDER — METFORMIN HCL 500 MG PO TABS: 500.0000 mg | ORAL_TABLET | Freq: Two times a day (BID) | ORAL | Status: DC

## 2013-06-30 NOTE — Progress Notes (Unsigned)
Patient ID: Ray Garner, male   DOB: 10-01-1955, 58 y.o.   MRN: [id number]   HPI: Ray Garner is a 58 y.o. male presenting on 06/30/2013 with PMH as below who is presenting to [place] because he feels that his PCP was not addressing his back pain. He has had it for few years. He has had xrays done and was told his discs were crushed. His pain is in his lower lumbosacral area (he is pointing to it) and states his hips hurt. He was taking Meloxicam and it was not controlling his pain. He started a new job which involves walking and has been taking BC powders and Ibuprofen to "make it trough the night".     Past Medical History  Diagnosis Date  . Hypertension   . Gout   . Arthritis   . Back pain, lumbosacral 06/30/2013  . DM type 2 (diabetes mellitus, type 2) 06/30/2013  . HTN (hypertension) 06/30/2013  . Hypothyroid 06/30/2013    Past Surgical History  Procedure Laterality Date  . Cyst excision    . Fracture surgery      Current Outpatient Prescriptions  Medication Sig Dispense Refill  . allopurinol (ZYLOPRIM) 300 MG tablet Take 300 mg by mouth daily.      Marland Kitchen amLODipine (NORVASC) 5 MG tablet Take 5 mg by mouth daily.      Marland Kitchen levothyroxine (SYNTHROID, LEVOTHROID) 150 MCG tablet Take 150 mcg by mouth daily before breakfast.      . lisinopril (PRINIVIL,ZESTRIL) 20 MG tablet Take 20 mg by mouth daily.      . meloxicam (MOBIC) 15 MG tablet Take 15 mg by mouth daily.      . metFORMIN (GLUCOPHAGE) 500 MG tablet Take 500 mg by mouth 2 (two) times daily with a meal.      . Aspirin-Salicylamide-Caffeine (BC HEADACHE POWDER PO) Take 1-2 packets by mouth 3 (three) times daily as needed (for pain).      Marland Kitchen diclofenac sodium (VOLTAREN) 1 % GEL Apply 4 g topically 4 (four) times daily.  100 g  3  . famotidine (PEPCID) 20 MG tablet Take 1 tablet (20 mg total) by mouth 2 (two) times daily.  60 tablet  3  . HYDROcodone-acetaminophen (NORCO/VICODIN) 5-325 MG per tablet Take 1-2 tablets by mouth every 6 (six) hours as  needed for moderate pain.  15 tablet  0  . metroNIDAZOLE (FLAGYL) 500 MG tablet Take 1 tablet (500 mg total) by mouth 2 (two) times daily.  14 tablet  0  . naproxen (NAPROSYN) 500 MG tablet Take 1 tablet (500 mg total) by mouth daily.  30 tablet  0  . omeprazole (PRILOSEC) 40 MG capsule Take 1 capsule (40 mg total) by mouth daily.  30 capsule  3  . vitamin B-12 (CYANOCOBALAMIN) 1000 MCG tablet Take 1,000 mcg by mouth daily.       No current facility-administered medications for this visit.    Allergies  Allergen Reactions  . Vicodin [Hydrocodone-Acetaminophen]     History reviewed. No pertinent family history.  History   Social History  . Marital Status: Divorced    Spouse Name: N/A    Number of Children: N/A  . Years of Education: N/A   Occupational History  . Not on file.   Social History Main Topics  . Smoking status: Former Smoker    Quit date: 05/14/1983  . Smokeless tobacco: Not on file  . Alcohol Use: Yes  . Drug Use: No  . Sexual Activity: Not  on file   Other Topics Concern  . Not on file   Social History Narrative  . No narrative on file    Review of Systems  Review of Systems  Constitutional: Negative for fever, chills, diaphoresis, activity change, appetite change and fatigue.  HENT: Negative for ear pain, nosebleeds, congestion, facial swelling, rhinorrhea, neck pain, neck stiffness and ear discharge.  Eyes: Negative for pain, discharge, redness, itching and visual disturbance.  Respiratory: Negative for cough, choking, chest tightness, shortness of breath, wheezing and stridor.  Cardiovascular: Negative for chest pain, palpitations and leg swelling.  Gastrointestinal: Negative for abdominal distention, vomiting, diarrhea or consitpation Genitourinary: Negative for dysuria, urgency, frequency, hematuria, flank pain, decreased urine volume, difficulty urinating and dyspareunia.  Musculoskeletal: Negative for back pain, joint swelling, arthralgias or gait  problem.  Neurological: Negative for dizziness, tremors, seizures, syncope, facial asymmetry, speech difficulty, weakness, light-headedness, numbness and headaches.  Hematological: Negative for adenopathy. Does not bruise/bleed easily.  Psychiatric/Behavioral: Negative for hallucinations, behavioral problems, confusion, dysphoric mood   Objective:  BP 151/92  Pulse 85  Temp(Src) 99 F (37.2 C) (Oral)  Resp 16  Ht 6' (1.829 m)  Wt 256 lb (116.121 kg)  BMI 34.71 kg/m2  SpO2 98% Filed Weights   06/30/13 1145  Weight: 256 lb (116.121 kg)     Physical Exam  Constitutional: Appears well-developed and well-nourished. No distress. HENT: Normocephalic. External right and left ear normal. Oropharynx is clear and moist.  Eyes: Conjunctivae and EOM are normal. PERRLA, no scleral icterus.  Neck: Normal ROM. Neck supple. No JVD. No tracheal deviation. No thyromegaly.  CVS: RRR, S1/S2 +, no murmurs, no gallops, no carotid bruit.  Pulmonary: Effort and breath sounds normal, no stridor, rhonchi, wheezes, rales.  Abdominal: Soft. BS +,  no distension, tenderness, rebound or guarding.  Musculoskeletal: Normal range of motion. No edema and no tenderness.  Neuro: Alert. Normal reflexes, muscle tone coordination. No cranial nerve deficit. Skin: Skin is warm and dry. No rash noted. Not diaphoretic. No erythema. No pallor.  Psychiatric: Normal mood and affect. Behavior, judgment, thought content normal.   Lab Results  Component Value Date   WBC 10.8* 12/06/2010   HGB 15.2 12/06/2010   HCT 44.8 12/06/2010   MCV 85.8 12/06/2010   PLT 265 12/06/2010   Lab Results  Component Value Date   CREATININE 1.15 12/06/2010   BUN 11 12/06/2010   NA 139 12/06/2010   K 4.0 12/06/2010   CL 102 12/06/2010   CO2 26 12/06/2010    Lab Results  Component Value Date   HGBA1C 6.3 06/30/2013   Lipid Panel  No results found for this basename: chol,  trig,  hdl,  cholhdl,  vldl,  ldlcalc        Patient  Active Problem List   Diagnosis Date Noted  . DM type 2 (diabetes mellitus, type 2) 06/30/2013  . HTN (hypertension) 06/30/2013  . Gout 06/30/2013  . Back pain, lumbosacral 06/30/2013  . Testicular cyst s/p surgery 06/30/2013  . Hypothyroid 06/30/2013     Preventative Medicine:  There are no preventive care reminders to display for this patient.  There are no preventive care reminders to display for this patient.  Colonoscopy : ordered   LAB WORK: ordered Metabolic panel: CBC:  Vitamin D : Lipid Panel: TSH: PSA:    Assessment and plan: DM type 2  - cont current medications  HTN (hypertension)  - cont meds  Gout  - cont allopurinol  Back pain, lumbosacral  -  add Naprosyn to Meloxicam for the arthritis component (hips and lumbar spine) - Add Voltaren gel to lumbar muscular component - obtain MRI for c/o pain radiating down back of b/l legs - ortho referral  Hypothyroid - cont home meds  Return in about 1 month (around 07/31/2013).   The patient was given clear instructions to go to ER or return to medical center if symptoms don't improve, worsen or new problems develop. The patient verbalized understanding. The patient was told to call to get lab results if they haven't heard anything in the next week.     Debbe Odea, MD

## 2013-06-30 NOTE — Progress Notes (Unsigned)
Pt comes in to establish care for multiple complaints C/o right great toe burning sensation with discoloration at nail bed for months C/o left scrotal cyst  Blurry vision noted. Last year eye exam

## 2013-07-01 ENCOUNTER — Telehealth: Payer: Self-pay | Admitting: *Deleted

## 2013-07-01 DIAGNOSIS — E559 Vitamin D deficiency, unspecified: Secondary | ICD-10-CM

## 2013-07-01 LAB — VITAMIN D 25 HYDROXY (VIT D DEFICIENCY, FRACTURES): VIT D 25 HYDROXY: 16 ng/mL — AB (ref 30–89)

## 2013-07-01 MED ORDER — VITAMIN D (ERGOCALCIFEROL) 1.25 MG (50000 UNIT) PO CAPS: 50000.0000 [IU] | ORAL_CAPSULE | ORAL | Status: DC

## 2013-07-01 NOTE — Telephone Encounter (Signed)
Spoke to pt son and pt will return my phone call.

## 2013-07-01 NOTE — Telephone Encounter (Signed)
Message copied by Joan Mayans on Wed Jul 01, 2013 11:51 AM ------      Message from: Debbe Odea      Created: Wed Jul 01, 2013  7:30 AM       Please let patient know his cholesterol is elevated and he needs to start taking a low cholesterol diet. Please have him see our nutritionist for education.      His Vit D is also low. Please start Vit D 50,000 U weeks - 4 tabs, 6 refills. Thank you. ------

## 2013-07-01 NOTE — Telephone Encounter (Signed)
Pt is aware of his lab results. 

## 2013-07-01 NOTE — Telephone Encounter (Deleted)
Message copied by Joan Mayans on Wed Jul 01, 2013 11:54 AM ------      Message from: Debbe Odea      Created: Wed Jul 01, 2013  7:30 AM       Please let patient know his cholesterol is elevated and he needs to start taking a low cholesterol diet. Please have him see our nutritionist for education.      His Vit D is also low. Please start Vit D 50,000 U weeks - 4 tabs, 6 refills. Thank you. ------

## 2013-07-09 ENCOUNTER — Ambulatory Visit: Payer: BC Managed Care – PPO | Admitting: Internal Medicine

## 2013-07-16 ENCOUNTER — Ambulatory Visit (HOSPITAL_COMMUNITY): Admission: RE | Admit: 2013-07-16 | Payer: No Typology Code available for payment source | Source: Ambulatory Visit

## 2013-07-31 ENCOUNTER — Encounter: Payer: Self-pay | Admitting: Internal Medicine

## 2013-07-31 ENCOUNTER — Ambulatory Visit: Payer: No Typology Code available for payment source | Attending: Internal Medicine | Admitting: Internal Medicine

## 2013-07-31 VITALS — BP 161/97 | HR 69 | Temp 98.1°F | Resp 16 | Ht 72.0 in | Wt 249.0 lb

## 2013-07-31 DIAGNOSIS — E039 Hypothyroidism, unspecified: Secondary | ICD-10-CM | POA: Insufficient documentation

## 2013-07-31 DIAGNOSIS — Z87891 Personal history of nicotine dependence: Secondary | ICD-10-CM | POA: Insufficient documentation

## 2013-07-31 DIAGNOSIS — Z791 Long term (current) use of non-steroidal anti-inflammatories (NSAID): Secondary | ICD-10-CM | POA: Insufficient documentation

## 2013-07-31 DIAGNOSIS — E119 Type 2 diabetes mellitus without complications: Secondary | ICD-10-CM

## 2013-07-31 DIAGNOSIS — M109 Gout, unspecified: Secondary | ICD-10-CM

## 2013-07-31 DIAGNOSIS — I10 Essential (primary) hypertension: Secondary | ICD-10-CM

## 2013-07-31 DIAGNOSIS — M25559 Pain in unspecified hip: Secondary | ICD-10-CM

## 2013-07-31 DIAGNOSIS — Z79899 Other long term (current) drug therapy: Secondary | ICD-10-CM | POA: Insufficient documentation

## 2013-07-31 LAB — POCT GLYCOSYLATED HEMOGLOBIN (HGB A1C): Hemoglobin A1C: 6.2

## 2013-07-31 LAB — GLUCOSE, POCT (MANUAL RESULT ENTRY): POC Glucose: 108 mg/dL — AB (ref 70–99)

## 2013-07-31 MED ORDER — METFORMIN HCL 500 MG PO TABS: 500.0000 mg | ORAL_TABLET | Freq: Two times a day (BID) | ORAL | Status: DC

## 2013-07-31 MED ORDER — CAPSAICIN 0.025 % EX CREA: TOPICAL_CREAM | Freq: Two times a day (BID) | CUTANEOUS | Status: DC

## 2013-07-31 MED ORDER — LISINOPRIL 20 MG PO TABS: 20.0000 mg | ORAL_TABLET | Freq: Every day | ORAL | Status: DC

## 2013-07-31 MED ORDER — ALLOPURINOL 300 MG PO TABS: 300.0000 mg | ORAL_TABLET | Freq: Every day | ORAL | Status: DC

## 2013-07-31 MED ORDER — TRAMADOL HCL 50 MG PO TABS: 50.0000 mg | ORAL_TABLET | Freq: Every evening | ORAL | Status: DC | PRN

## 2013-07-31 MED ORDER — AMLODIPINE BESYLATE 10 MG PO TABS: 10.0000 mg | ORAL_TABLET | Freq: Every day | ORAL | Status: DC

## 2013-07-31 NOTE — Patient Instructions (Signed)
DASH Diet  The DASH diet stands for "Dietary Approaches to Stop Hypertension." It is a healthy eating plan that has been shown to reduce high blood pressure (hypertension) in as little as 14 days, while also possibly providing other significant health benefits. These other health benefits include reducing the risk of breast cancer after menopause and reducing the risk of type 2 diabetes, heart disease, colon cancer, and stroke. Health benefits also include weight loss and slowing kidney failure in patients with chronic kidney disease.   DIET GUIDELINES  · Limit salt (sodium). Your diet should contain less than 1500 mg of sodium daily.  · Limit refined or processed carbohydrates. Your diet should include mostly whole grains. Desserts and added sugars should be used sparingly.  · Include small amounts of heart-healthy fats. These types of fats include nuts, oils, and tub margarine. Limit saturated and trans fats. These fats have been shown to be harmful in the body.  CHOOSING FOODS   The following food groups are based on a 2000 calorie diet. See your Registered Dietitian for individual calorie needs.  Grains and Grain Products (6 to 8 servings daily)  · Eat More Often: Whole-wheat bread, brown rice, whole-grain or wheat pasta, quinoa, popcorn without added fat or salt (air popped).  · Eat Less Often: White bread, white pasta, white rice, cornbread.  Vegetables (4 to 5 servings daily)  · Eat More Often: Fresh, frozen, and canned vegetables. Vegetables may be raw, steamed, roasted, or grilled with a minimal amount of fat.  · Eat Less Often/Avoid: Creamed or fried vegetables. Vegetables in a cheese sauce.  Fruit (4 to 5 servings daily)  · Eat More Often: All fresh, canned (in natural juice), or frozen fruits. Dried fruits without added sugar. One hundred percent fruit juice (½ cup [237 mL] daily).  · Eat Less Often: Dried fruits with added sugar. Canned fruit in light or heavy syrup.  Lean Meats, Fish, and Poultry (2  servings or less daily. One serving is 3 to 4 oz [85-114 g]).  · Eat More Often: Ninety percent or leaner ground beef, tenderloin, sirloin. Round cuts of beef, chicken breast, turkey breast. All fish. Grill, bake, or broil your meat. Nothing should be fried.  · Eat Less Often/Avoid: Fatty cuts of meat, turkey, or chicken leg, thigh, or wing. Fried cuts of meat or fish.  Dairy (2 to 3 servings)  · Eat More Often: Low-fat or fat-free milk, low-fat plain or light yogurt, reduced-fat or part-skim cheese.  · Eat Less Often/Avoid: Milk (whole, 2%). Whole milk yogurt. Full-fat cheeses.  Nuts, Seeds, and Legumes (4 to 5 servings per week)  · Eat More Often: All without added salt.  · Eat Less Often/Avoid: Salted nuts and seeds, canned beans with added salt.  Fats and Sweets (limited)  · Eat More Often: Vegetable oils, tub margarines without trans fats, sugar-free gelatin. Mayonnaise and salad dressings.  · Eat Less Often/Avoid: Coconut oils, palm oils, butter, stick margarine, cream, half and half, cookies, candy, pie.  FOR MORE INFORMATION  The Dash Diet Eating Plan: www.dashdiet.org  Document Released: 02/01/2011 Document Revised: 05/07/2011 Document Reviewed: 02/01/2011  [employer]® Patient Information ©2014 [employer].

## 2013-07-31 NOTE — Progress Notes (Signed)
Patient here to follow up on HTN, DM, and pain in hip, lower back, and testicle from a cyst.

## 2013-07-31 NOTE — Progress Notes (Signed)
Patient ID: Ray Garner, male   DOB: 1955/12/22, 58 y.o.   MRN: [id number]  CC: f/u  HPI:  Patient presents today for a follow up of DM.  Patient reports that he has lost 27 pounds in 6 months. He states that he exercises daily and walks all night.  He states that he eats only one big meal per day with multiple small meals.  Does not check BS or BP at home.  Patient reports medication compliance. Patient has history of arthritis with pain in lower back and bilateral hips  Allergies  Allergen Reactions  . Vicodin [Hydrocodone-Acetaminophen]    Past Medical History  Diagnosis Date  . Hypertension   . Gout   . Arthritis   . Back pain, lumbosacral 06/30/2013  . DM type 2 (diabetes mellitus, type 2) 06/30/2013  . HTN (hypertension) 06/30/2013  . Hypothyroid 06/30/2013   Current Outpatient Prescriptions on File Prior to Visit  Medication Sig Dispense Refill  . allopurinol (ZYLOPRIM) 300 MG tablet Take 1 tablet (300 mg total) by mouth daily.  30 tablet  0  . amLODipine (NORVASC) 5 MG tablet Take 1 tablet (5 mg total) by mouth daily.  30 tablet  0  . Aspirin-Salicylamide-Caffeine (BC HEADACHE POWDER PO) Take 1-2 packets by mouth 3 (three) times daily as needed (for pain).      . famotidine (PEPCID) 20 MG tablet Take 1 tablet (20 mg total) by mouth 2 (two) times daily.  60 tablet  3  . levothyroxine (SYNTHROID, LEVOTHROID) 150 MCG tablet Take 150 mcg by mouth daily before breakfast.      . lisinopril (PRINIVIL,ZESTRIL) 20 MG tablet Take 1 tablet (20 mg total) by mouth daily.  30 tablet  0  . meloxicam (MOBIC) 15 MG tablet Take 15 mg by mouth daily.      . metFORMIN (GLUCOPHAGE) 500 MG tablet Take 1 tablet (500 mg total) by mouth 2 (two) times daily with a meal.  60 tablet  0  . naproxen (NAPROSYN) 500 MG tablet Take 1 tablet (500 mg total) by mouth daily.  30 tablet  0  . omeprazole (PRILOSEC) 40 MG capsule Take 1 capsule (40 mg total) by mouth daily.  30 capsule  3  . vitamin B-12 (CYANOCOBALAMIN) 1000  MCG tablet Take 1,000 mcg by mouth daily.      . diclofenac sodium (VOLTAREN) 1 % GEL Apply 4 g topically 4 (four) times daily.  100 g  3  . HYDROcodone-acetaminophen (NORCO/VICODIN) 5-325 MG per tablet Take 1-2 tablets by mouth every 6 (six) hours as needed for moderate pain.  15 tablet  0  . metroNIDAZOLE (FLAGYL) 500 MG tablet Take 1 tablet (500 mg total) by mouth 2 (two) times daily.  14 tablet  0  . Vitamin D, Ergocalciferol, (DRISDOL) 50000 UNITS CAPS capsule Take 1 capsule (50,000 Units total) by mouth every 7 (seven) days.  4 capsule  6   No current facility-administered medications on file prior to visit.   No family history on file. History   Social History  . Marital Status: Divorced    Spouse Name: N/A    Number of Children: N/A  . Years of Education: N/A   Occupational History  . Not on file.   Social History Main Topics  . Smoking status: Former Smoker    Quit date: 05/14/1983  . Smokeless tobacco: Not on file  . Alcohol Use: Yes  . Drug Use: No  . Sexual Activity: Not on file  Other Topics Concern  . Not on file   Social History Narrative  . No narrative on file    Review of Systems: See HPI   Objective:   Filed Vitals:   07/31/13 1139  BP: 161/97  Pulse: 69  Temp: 98.1 F (36.7 C)  Resp: 16    Physical Exam  Constitutional: He is oriented to person, place, and time.  Neck: Normal range of motion.  Cardiovascular: Normal rate, regular rhythm, normal heart sounds and intact distal pulses.   Pulmonary/Chest: Effort normal and breath sounds normal.  Abdominal: Soft. Bowel sounds are normal. He exhibits distension. There is no tenderness.  Musculoskeletal: Normal range of motion.  Neurological: He is alert and oriented to person, place, and time. He has normal reflexes.  Skin: Skin is warm and dry.  Psychiatric: He has a normal mood and affect.     Lab Results  Component Value Date   WBC 7.2 06/30/2013   HGB 13.8 06/30/2013   HCT 40.6  06/30/2013   MCV 86.4 06/30/2013   PLT 254 06/30/2013   Lab Results  Component Value Date   CREATININE 0.95 06/30/2013   BUN 10 06/30/2013   NA 141 06/30/2013   K 4.1 06/30/2013   CL 105 06/30/2013   CO2 26 06/30/2013    Lab Results  Component Value Date   HGBA1C 6.2 07/31/2013   Lipid Panel     Component Value Date/Time   CHOL 146 06/30/2013 1202   TRIG 225* 06/30/2013 1202   HDL 36* 06/30/2013 1202   CHOLHDL 4.1 06/30/2013 1202   VLDL 45* 06/30/2013 1202   LDLCALC 65 06/30/2013 1202       Assessment and plan:   Ray Garner was seen today for follow-up.  Diagnoses and associated orders for this visit:  DM type 2 (diabetes mellitus, type 2) - Glucose (CBG) - POCT A1C - metFORMIN (GLUCOPHAGE) 500 MG tablet; Take 1 tablet (500 mg total) by mouth 2 (two) times daily with a meal.  HTN (hypertension) - lisinopril (PRINIVIL,ZESTRIL) 20 MG tablet; Take 1 tablet (20 mg total) by mouth daily. - amLODipine (NORVASC) 10 MG tablet; Take 1 tablet (10 mg total) by mouth daily.  Gout - allopurinol (ZYLOPRIM) 300 MG tablet; Take 1 tablet (300 mg total) by mouth daily.  Hip pain - capsaicin (ZOSTRIX) 0.025 % cream; Apply topically 2 (two) times daily. - traMADol (ULTRAM) 50 MG tablet; Take 1 tablet (50 mg total) by mouth at bedtime as needed.   Return in about 3 months (around 10/31/2013) for DM/HTN.    Ray Garner, [facility] [phone] 08/02/2013, 12:21 PM

## 2013-09-08 ENCOUNTER — Telehealth: Payer: Self-pay | Admitting: Internal Medicine

## 2013-09-08 ENCOUNTER — Telehealth: Payer: Self-pay | Admitting: Emergency Medicine

## 2013-09-08 NOTE — Telephone Encounter (Signed)
Pt called req med refill for traMADol (ULTRAM) 50 MG tablet. Pls give Pt. callback

## 2013-09-08 NOTE — Telephone Encounter (Signed)
Pt states he never called in for medication refill Tramadol

## 2013-09-10 ENCOUNTER — Telehealth: Payer: Self-pay | Admitting: Emergency Medicine

## 2013-09-10 NOTE — Telephone Encounter (Signed)
Pt would like to speak to clinician, still having severe pain in hip, leg, and back. Please f/u with pt.

## 2013-10-13 ENCOUNTER — Emergency Department (HOSPITAL_COMMUNITY)
Admission: EM | Admit: 2013-10-13 | Discharge: 2013-10-13 | Disposition: A | Discharge status: Home / Self Care | Payer: BC Managed Care – PPO | Attending: Emergency Medicine | Admitting: Emergency Medicine

## 2013-10-13 ENCOUNTER — Encounter (HOSPITAL_COMMUNITY): Payer: Self-pay | Admitting: Emergency Medicine

## 2013-10-13 DIAGNOSIS — Y92410 Unspecified street and highway as the place of occurrence of the external cause: Secondary | ICD-10-CM | POA: Diagnosis not present

## 2013-10-13 DIAGNOSIS — Z791 Long term (current) use of non-steroidal anti-inflammatories (NSAID): Secondary | ICD-10-CM | POA: Diagnosis not present

## 2013-10-13 DIAGNOSIS — S99929A Unspecified injury of unspecified foot, initial encounter: Secondary | ICD-10-CM

## 2013-10-13 DIAGNOSIS — M545 Low back pain: Secondary | ICD-10-CM

## 2013-10-13 DIAGNOSIS — E119 Type 2 diabetes mellitus without complications: Secondary | ICD-10-CM | POA: Diagnosis not present

## 2013-10-13 DIAGNOSIS — S161XXA Strain of muscle, fascia and tendon at neck level, initial encounter: Secondary | ICD-10-CM

## 2013-10-13 DIAGNOSIS — M129 Arthropathy, unspecified: Secondary | ICD-10-CM | POA: Insufficient documentation

## 2013-10-13 DIAGNOSIS — S8990XA Unspecified injury of unspecified lower leg, initial encounter: Secondary | ICD-10-CM | POA: Diagnosis not present

## 2013-10-13 DIAGNOSIS — I10 Essential (primary) hypertension: Secondary | ICD-10-CM | POA: Diagnosis not present

## 2013-10-13 DIAGNOSIS — E039 Hypothyroidism, unspecified: Secondary | ICD-10-CM | POA: Insufficient documentation

## 2013-10-13 DIAGNOSIS — IMO0002 Reserved for concepts with insufficient information to code with codable children: Secondary | ICD-10-CM | POA: Diagnosis not present

## 2013-10-13 DIAGNOSIS — S199XXA Unspecified injury of neck, initial encounter: Secondary | ICD-10-CM

## 2013-10-13 DIAGNOSIS — S139XXA Sprain of joints and ligaments of unspecified parts of neck, initial encounter: Secondary | ICD-10-CM | POA: Diagnosis not present

## 2013-10-13 DIAGNOSIS — M79605 Pain in left leg: Secondary | ICD-10-CM

## 2013-10-13 DIAGNOSIS — M109 Gout, unspecified: Secondary | ICD-10-CM | POA: Diagnosis not present

## 2013-10-13 DIAGNOSIS — S99919A Unspecified injury of unspecified ankle, initial encounter: Secondary | ICD-10-CM

## 2013-10-13 DIAGNOSIS — Z760 Encounter for issue of repeat prescription: Secondary | ICD-10-CM | POA: Insufficient documentation

## 2013-10-13 DIAGNOSIS — S0993XA Unspecified injury of face, initial encounter: Secondary | ICD-10-CM | POA: Insufficient documentation

## 2013-10-13 DIAGNOSIS — Z79899 Other long term (current) drug therapy: Secondary | ICD-10-CM | POA: Diagnosis not present

## 2013-10-13 DIAGNOSIS — Y9389 Activity, other specified: Secondary | ICD-10-CM | POA: Insufficient documentation

## 2013-10-13 MED ORDER — CYCLOBENZAPRINE HCL 10 MG PO TABS
10.0000 mg | ORAL_TABLET | Freq: Every day | ORAL | Status: DC
Start: 2013-10-13 — End: 2014-05-19

## 2013-10-13 MED ORDER — OMEPRAZOLE 20 MG PO CPDR: 40.0000 mg | DELAYED_RELEASE_CAPSULE | Freq: Every day | ORAL | Status: DC

## 2013-10-13 MED ORDER — TRAMADOL HCL 50 MG PO TABS
50.0000 mg | ORAL_TABLET | Freq: Four times a day (QID) | ORAL | Status: DC | PRN
Start: 2013-10-13 — End: 2013-10-13

## 2013-10-13 MED ORDER — IBUPROFEN 800 MG PO TABS: 800.0000 mg | ORAL_TABLET | Freq: Three times a day (TID) | ORAL | Status: DC

## 2013-10-13 MED ORDER — LEVOTHYROXINE SODIUM 100 MCG PO TABS: 100.0000 ug | ORAL_TABLET | Freq: Every day | ORAL | Status: DC

## 2013-10-13 MED ORDER — TRAMADOL HCL 50 MG PO TABS: 50.0000 mg | ORAL_TABLET | Freq: Four times a day (QID) | ORAL | Status: DC | PRN

## 2013-10-13 MED ORDER — NAPROXEN 500 MG PO TABS
500.0000 mg | ORAL_TABLET | Freq: Two times a day (BID) | ORAL | Status: DC
Start: 2013-10-13 — End: 2013-12-29

## 2013-10-13 NOTE — Discharge Instructions (Signed)
Call for a follow up appointment with a Family or Primary Care Provider for a medication refill, and further evaluation of your pain as needed. Return if Symptoms worsen.   Take medication as prescribed.  Do not operate heavy machinery while taking Ultram/ tramadol.

## 2013-10-13 NOTE — Discharge Planning (Signed)
[place] to patient regarding his orange card. Patient state he recently obtained insurance through his employer and is content with staying at the [facility] for primary care. Patient was informed his orange card would be cancelled due to having insurance. My contact information provided for any future questions or concerns. No other Community Liaison needs identified at this time.
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
## 2013-10-24 NOTE — ED Provider Notes (Signed)
Medical screening examination/treatment/procedure(s) were performed by non-physician practitioner and as supervising physician I was immediately available for consultation/collaboration.   EKG Interpretation None        Tanna Furry, MD 10/24/13 [phone]

## 2013-10-26 ENCOUNTER — Ambulatory Visit (HOSPITAL_COMMUNITY)
Admission: RE | Admit: 2013-10-26 | Discharge: 2013-10-26 | Disposition: A | Discharge status: Home / Self Care | Payer: No Typology Code available for payment source | Source: Ambulatory Visit | Attending: Chiropractic Medicine | Admitting: Chiropractic Medicine

## 2013-10-26 ENCOUNTER — Ambulatory Visit: Payer: No Typology Code available for payment source | Admitting: Internal Medicine

## 2013-10-26 ENCOUNTER — Other Ambulatory Visit (HOSPITAL_COMMUNITY): Payer: Self-pay | Admitting: Chiropractic Medicine

## 2013-10-26 DIAGNOSIS — M545 Low back pain, unspecified: Secondary | ICD-10-CM | POA: Insufficient documentation

## 2013-12-29 ENCOUNTER — Encounter: Payer: Self-pay | Admitting: Internal Medicine

## 2013-12-29 ENCOUNTER — Ambulatory Visit: Payer: BC Managed Care – PPO | Attending: Internal Medicine | Admitting: Internal Medicine

## 2013-12-29 VITALS — BP 150/93 | HR 91 | Temp 98.5°F | Resp 16 | Ht 72.0 in | Wt 242.0 lb

## 2013-12-29 DIAGNOSIS — I10 Essential (primary) hypertension: Secondary | ICD-10-CM | POA: Diagnosis not present

## 2013-12-29 DIAGNOSIS — E039 Hypothyroidism, unspecified: Secondary | ICD-10-CM | POA: Diagnosis not present

## 2013-12-29 DIAGNOSIS — Z2821 Immunization not carried out because of patient refusal: Secondary | ICD-10-CM

## 2013-12-29 DIAGNOSIS — E119 Type 2 diabetes mellitus without complications: Secondary | ICD-10-CM

## 2013-12-29 DIAGNOSIS — L729 Follicular cyst of the skin and subcutaneous tissue, unspecified: Secondary | ICD-10-CM | POA: Insufficient documentation

## 2013-12-29 DIAGNOSIS — Z202 Contact with and (suspected) exposure to infections with a predominantly sexual mode of transmission: Secondary | ICD-10-CM

## 2013-12-29 DIAGNOSIS — J029 Acute pharyngitis, unspecified: Secondary | ICD-10-CM

## 2013-12-29 DIAGNOSIS — Z87891 Personal history of nicotine dependence: Secondary | ICD-10-CM | POA: Diagnosis not present

## 2013-12-29 DIAGNOSIS — M545 Low back pain: Secondary | ICD-10-CM | POA: Diagnosis not present

## 2013-12-29 DIAGNOSIS — Z79899 Other long term (current) drug therapy: Secondary | ICD-10-CM | POA: Diagnosis not present

## 2013-12-29 DIAGNOSIS — M109 Gout, unspecified: Secondary | ICD-10-CM

## 2013-12-29 DIAGNOSIS — Z791 Long term (current) use of non-steroidal anti-inflammatories (NSAID): Secondary | ICD-10-CM | POA: Diagnosis not present

## 2013-12-29 LAB — GLUCOSE, POCT (MANUAL RESULT ENTRY): POC Glucose: 174 mg/dl — AB (ref 70–99)

## 2013-12-29 LAB — POCT URINALYSIS DIPSTICK
Bilirubin, UA: NEGATIVE
Blood, UA: NEGATIVE
Glucose, UA: NEGATIVE
Ketones, UA: NEGATIVE
Nitrite, UA: NEGATIVE
PROTEIN UA: NEGATIVE
SPEC GRAV UA: 1.02
Urobilinogen, UA: 0.2
pH, UA: 5.5

## 2013-12-29 LAB — POCT GLYCOSYLATED HEMOGLOBIN (HGB A1C): Hemoglobin A1C: 6.7

## 2013-12-29 MED ORDER — DOXYCYCLINE HYCLATE 100 MG PO TABS: 100.0000 mg | ORAL_TABLET | Freq: Two times a day (BID) | ORAL | Status: DC

## 2013-12-29 MED ORDER — METRONIDAZOLE 500 MG PO TABS: 2000.0000 mg | ORAL_TABLET | Freq: Once | ORAL | Status: DC

## 2013-12-29 MED ORDER — ALLOPURINOL 300 MG PO TABS: 300.0000 mg | ORAL_TABLET | Freq: Every day | ORAL | Status: DC

## 2013-12-29 MED ORDER — LISINOPRIL 20 MG PO TABS: 20.0000 mg | ORAL_TABLET | Freq: Every day | ORAL | Status: DC

## 2013-12-29 MED ORDER — METFORMIN HCL 500 MG PO TABS: 500.0000 mg | ORAL_TABLET | Freq: Two times a day (BID) | ORAL | Status: DC

## 2013-12-29 MED ORDER — AMLODIPINE BESYLATE 10 MG PO TABS: 10.0000 mg | ORAL_TABLET | Freq: Every day | ORAL | Status: DC

## 2013-12-29 NOTE — Progress Notes (Signed)
Pt is her following up on his HTN and diabetes. Pt states that he has a cyst under his scrotum. Pt is c/o of a sore throat. Pt suspects that he may have a STD b/c he has had some shooting pain in his penis and it was painful to touch.
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
## 2013-12-29 NOTE — Patient Instructions (Signed)

## 2013-12-30 LAB — RPR

## 2013-12-30 LAB — HIV ANTIBODY (ROUTINE TESTING W REFLEX): HIV 1&2 Ab, 4th Generation: NONREACTIVE

## 2013-12-30 LAB — MICROALBUMIN, URINE: Microalb, Ur: 1.9 mg/dL (ref <2.0)

## 2013-12-30 LAB — CULTURE, GROUP A STREP

## 2014-01-04 ENCOUNTER — Other Ambulatory Visit: Payer: Self-pay | Admitting: *Deleted

## 2014-01-04 ENCOUNTER — Telehealth: Payer: Self-pay | Admitting: Internal Medicine

## 2014-01-04 ENCOUNTER — Telehealth: Payer: Self-pay

## 2014-01-04 DIAGNOSIS — Z202 Contact with and (suspected) exposure to infections with a predominantly sexual mode of transmission: Secondary | ICD-10-CM

## 2014-01-04 MED ORDER — METRONIDAZOLE 500 MG PO TABS: 2000.0000 mg | ORAL_TABLET | Freq: Once | ORAL | Status: DC

## 2014-01-04 MED ORDER — DOXYCYCLINE HYCLATE 100 MG PO TABS: 100.0000 mg | ORAL_TABLET | Freq: Two times a day (BID) | ORAL | Status: DC

## 2014-01-04 NOTE — Telephone Encounter (Signed)
Patient called stating he lost his prescription Requesting refills on his medication- ABT Prescription sent to [facility] on file

## 2014-01-04 NOTE — Telephone Encounter (Signed)
Pt. Called stating that he lost his antibiotic medications, pt. States that he has not started taking them and would like another prescription. Please f/u with pt.

## 2014-01-08 ENCOUNTER — Other Ambulatory Visit: Payer: Self-pay | Admitting: *Deleted

## 2014-01-08 ENCOUNTER — Telehealth: Payer: Self-pay

## 2014-01-08 ENCOUNTER — Other Ambulatory Visit: Payer: Self-pay | Admitting: Internal Medicine

## 2014-01-08 DIAGNOSIS — Z113 Encounter for screening for infections with a predominantly sexual mode of transmission: Secondary | ICD-10-CM

## 2014-01-08 DIAGNOSIS — Z202 Contact with and (suspected) exposure to infections with a predominantly sexual mode of transmission: Secondary | ICD-10-CM

## 2014-01-08 MED ORDER — METRONIDAZOLE 500 MG PO TABS: 2000.0000 mg | ORAL_TABLET | Freq: Once | ORAL | Status: DC

## 2014-01-09 LAB — GC/CHLAMYDIA PROBE AMP
CT Probe RNA: NEGATIVE
GC PROBE AMP APTIMA: NEGATIVE

## 2014-01-11 ENCOUNTER — Telehealth: Payer: Self-pay | Admitting: Emergency Medicine

## 2014-01-11 NOTE — Telephone Encounter (Signed)
Attempted to reach pt with results and medication instructions. Family member states pt is on honeymoon and will not be back until next Monday. Left message for pt to call when message received.

## 2014-01-11 NOTE — Telephone Encounter (Signed)
-----   Message from Lance Bosch, NP sent at 01/11/2014  9:56 AM EST ----- Geronimo Boot and chlamydia negative, Trichomonas was not entered on his urine, but probably would have been inconclusive anyway.  Let him finish the flagyl for assurance, may stop Doxycycline. Find out if his throat is improving with the antibiotics, if not he was found to have strep on his throat culture and it may need to be treated with different antibiotic than the ones he were on for possible STD treatment. If it is not improving please send him azithromycin 500 mg BID for 5 days. If he feels better he will not need the additional antibiotic therapy.

## 2014-01-12 ENCOUNTER — Encounter: Payer: Self-pay | Admitting: *Deleted

## 2014-01-12 NOTE — Progress Notes (Signed)
Pt is aware of his lab results. Pt stated that he was feeling much better. I told him to call me back when he completed his antibiotics to see if we may need some more medications.

## 2014-01-19 ENCOUNTER — Encounter (HOSPITAL_COMMUNITY): Payer: Self-pay | Admitting: Emergency Medicine

## 2014-01-19 ENCOUNTER — Emergency Department (HOSPITAL_COMMUNITY): Payer: BC Managed Care – PPO

## 2014-01-19 ENCOUNTER — Emergency Department (HOSPITAL_COMMUNITY)
Admission: EM | Admit: 2014-01-19 | Discharge: 2014-01-20 | Disposition: A | Discharge status: Home / Self Care | Payer: BC Managed Care – PPO | Attending: Emergency Medicine | Admitting: Emergency Medicine

## 2014-01-19 DIAGNOSIS — N451 Epididymitis: Secondary | ICD-10-CM | POA: Diagnosis not present

## 2014-01-19 DIAGNOSIS — Z79899 Other long term (current) drug therapy: Secondary | ICD-10-CM | POA: Insufficient documentation

## 2014-01-19 DIAGNOSIS — R040 Epistaxis: Secondary | ICD-10-CM | POA: Diagnosis not present

## 2014-01-19 DIAGNOSIS — Z792 Long term (current) use of antibiotics: Secondary | ICD-10-CM | POA: Insufficient documentation

## 2014-01-19 DIAGNOSIS — N508 Other specified disorders of male genital organs: Secondary | ICD-10-CM | POA: Diagnosis present

## 2014-01-19 DIAGNOSIS — N5082 Scrotal pain: Secondary | ICD-10-CM

## 2014-01-19 DIAGNOSIS — E039 Hypothyroidism, unspecified: Secondary | ICD-10-CM | POA: Diagnosis not present

## 2014-01-19 DIAGNOSIS — Z87891 Personal history of nicotine dependence: Secondary | ICD-10-CM | POA: Insufficient documentation

## 2014-01-19 DIAGNOSIS — E119 Type 2 diabetes mellitus without complications: Secondary | ICD-10-CM | POA: Diagnosis not present

## 2014-01-19 DIAGNOSIS — I10 Essential (primary) hypertension: Secondary | ICD-10-CM | POA: Insufficient documentation

## 2014-01-19 DIAGNOSIS — M109 Gout, unspecified: Secondary | ICD-10-CM | POA: Diagnosis not present

## 2014-01-19 NOTE — ED Notes (Signed)
Pt. reports left testicular pain with swelling onset this evening , denies injury , no dysuria / no fever or chills, also reported epistaxis at right nare this afternoon - no bleeding at triage .

## 2014-01-19 NOTE — ED Provider Notes (Signed)
CSN: [id number]     Arrival date & time 01/19/14  2020 History   First MD Initiated Contact with Patient 01/19/14 2233     Chief Complaint  Patient presents with  . Testicle Pain  . Epistaxis     (Consider location/radiation/quality/duration/timing/severity/associated sxs/prior Treatment) HPI Patient is a 58 year old male who presents complaining of left testicle pain. He says that he has had several episodes of similar pain in the past, for which she was worked up here back in March. Findings included hydrocele, spermatocele, and varicocele. He says that for the past week he has been having a gradually increasing pain in his left testicle associated with some mild swelling at the Cipro superior pole of the testicle. He says the pain seems to radiate into his groin, and his left thigh. Denies any trauma to his testicles. Head and the recent trauma to his testicles. He currently says the pain is 5 out of 10, and has been worsened today by walking.   Past Medical History  Diagnosis Date  . Hypertension   . Gout   . Arthritis   . Back pain, lumbosacral 06/30/2013  . DM type 2 (diabetes mellitus, type 2) 06/30/2013  . HTN (hypertension) 06/30/2013  . Hypothyroid 06/30/2013   Past Surgical History  Procedure Laterality Date  . Cyst excision    . Fracture surgery     No family history on file. History  Substance Use Topics  . Smoking status: Former Smoker    Quit date: 05/14/1983  . Smokeless tobacco: Not on file  . Alcohol Use: Yes    Review of Systems  Genitourinary: Positive for scrotal swelling and testicular pain. Negative for dysuria and difficulty urinating.  All other systems reviewed and are negative.     Allergies  Vicodin  Home Medications   Prior to Admission medications   Medication Sig Start Date End Date Taking? Authorizing Provider  allopurinol (ZYLOPRIM) 300 MG tablet Take 1 tablet (300 mg total) by mouth daily. 12/29/13  Yes Lance Bosch, NP  amLODipine  (NORVASC) 10 MG tablet Take 1 tablet (10 mg total) by mouth daily. 12/29/13  Yes Lance Bosch, NP  doxycycline (VIBRA-TABS) 100 MG tablet Take 1 tablet (100 mg total) by mouth 2 (two) times daily. 01/04/14  Yes Lorayne Marek, MD  levothyroxine (SYNTHROID, LEVOTHROID) 150 MCG tablet Take 150 mcg by mouth daily before breakfast.   Yes Historical Provider, MD  lisinopril (PRINIVIL,ZESTRIL) 20 MG tablet Take 1 tablet (20 mg total) by mouth daily. 12/29/13  Yes Lance Bosch, NP  metFORMIN (GLUCOPHAGE) 500 MG tablet Take 1 tablet (500 mg total) by mouth 2 (two) times daily with a meal. 12/29/13  Yes Lance Bosch, NP  omeprazole (PRILOSEC) 20 MG capsule Take 2 capsules (40 mg total) by mouth daily. 10/13/13  Yes Harvie Heck, PA-C  vitamin B-12 (CYANOCOBALAMIN) 1000 MCG tablet Take 1,000 mcg by mouth daily.   Yes Historical Provider, MD  cyclobenzaprine (FLEXERIL) 10 MG tablet Take 1 tablet (10 mg total) by mouth at bedtime. Patient not taking: Reported on 01/19/2014 10/13/13   Harvie Heck, PA-C  levofloxacin (LEVAQUIN) 750 MG tablet Take 1 tablet (750 mg total) by mouth daily. 01/20/14   Leata Mouse, MD  metroNIDAZOLE (FLAGYL) 500 MG tablet Take 4 tablets (2,000 mg total) by mouth once. Patient not taking: Reported on 01/19/2014 01/08/14   Lance Bosch, NP  oxyCODONE-acetaminophen (PERCOCET/ROXICET) 5-325 MG per tablet Take 2 tablets by mouth every 4 (four) hours  as needed for moderate pain or severe pain. 01/20/14   Leata Mouse, MD  Vitamin D, Ergocalciferol, (DRISDOL) 50000 UNITS CAPS capsule Take 1 capsule (50,000 Units total) by mouth every 7 (seven) days. Patient not taking: Reported on 01/19/2014 07/01/13   Debbe Odea, MD   BP 131/89 mmHg  Pulse 70  Temp(Src) 97.3 F (36.3 C)  Resp 15  SpO2 96% Physical Exam  Constitutional: He is oriented to person, place, and time. He appears well-developed and well-nourished.  HENT:  Head: Normocephalic and atraumatic.  Eyes: Pupils are equal,  round, and reactive to light.  Neck: Normal range of motion. Neck supple.  Cardiovascular: Normal rate and regular rhythm.   No murmur heard. Pulmonary/Chest: Effort normal and breath sounds normal. No respiratory distress.  Abdominal: Soft. Bowel sounds are normal. He exhibits no distension. Hernia confirmed negative in the right inguinal area and confirmed negative in the left inguinal area.  Genitourinary: Penis normal. Right testis shows no mass. Cremasteric reflex is not absent on the right side. Left testis shows swelling (Superior to the left  superior pole of the testicle) and tenderness. Left testis shows no mass. Cremasteric reflex is not absent on the left side. Circumcised.  Lymphadenopathy:       Right: No inguinal adenopathy present.       Left: No inguinal adenopathy present.  Neurological: He is alert and oriented to person, place, and time.  Skin: Skin is warm and dry.  Nursing note and vitals reviewed.   ED Course  Procedures (including critical care time) Labs Review Labs Reviewed  URINALYSIS, ROUTINE W REFLEX MICROSCOPIC    Imaging Review No results found.   EKG Interpretation None      MDM   Final diagnoses:  Scrotum pain  Epididymitis   58 year old male presenting with left testicle pain, likely epididymitis. Pain had a gradual onset, was not sudden, and is similar to previous episodes which have been imaged with ultrasound. No clinical evidence of testicular torsion on exam, cremasteric reflex present bilaterally. UA negative for any urinary tract infection. No hernias found on exam.   Will treat empirically for epididymitis, and have the patient follow up with urology as an outpatient after the holidays.  No emergent medical condition found on history or physical exam, patient is stable for discharge at this point with any without any further imaging or workup.  Leata Mouse, MD 01/20/14 [place], MD 01/25/14 [id number]

## 2014-01-20 ENCOUNTER — Telehealth: Payer: Self-pay | Admitting: Surgery

## 2014-01-20 LAB — URINALYSIS, ROUTINE W REFLEX MICROSCOPIC
BILIRUBIN URINE: NEGATIVE
GLUCOSE, UA: NEGATIVE mg/dL
Hgb urine dipstick: NEGATIVE
KETONES UR: NEGATIVE mg/dL
Leukocytes, UA: NEGATIVE
Nitrite: NEGATIVE
PH: 6 (ref 5.0–8.0)
PROTEIN: NEGATIVE mg/dL
Specific Gravity, Urine: 1.023 (ref 1.005–1.030)
Urobilinogen, UA: 0.2 mg/dL (ref 0.0–1.0)

## 2014-01-20 MED ORDER — OXYCODONE-ACETAMINOPHEN 5-325 MG PO TABS: 2.0000 | ORAL_TABLET | ORAL | Status: DC | PRN

## 2014-01-20 MED ORDER — LEVOFLOXACIN 750 MG PO TABS: 750.0000 mg | ORAL_TABLET | Freq: Every day | ORAL | Status: DC

## 2014-01-20 NOTE — Telephone Encounter (Signed)
ED CM received call from [place] concerning prescription for Percocet patient receieved in the ED today, unable to identify [facility] number. Will send patient back to ED to replace prescription. New prescription written by Dr. Dayna Barker, Prescription ready for pick up.  Patient notified.

## 2014-01-20 NOTE — Discharge Instructions (Signed)
Epididymitis Epididymitis is a swelling (inflammation) of the epididymis. The epididymis is a cord-like structure along the back part of the testicle. Epididymitis is usually, but not always, caused by infection. This is usually a sudden problem beginning with chills, fever and pain behind the scrotum and in the testicle. There may be swelling and redness of the testicle. DIAGNOSIS  Physical examination will reveal a tender, swollen epididymis. Sometimes, cultures are obtained from the urine or from prostate secretions to help find out if there is an infection or if the cause is a different problem. Sometimes, blood work is performed to see if your white blood cell count is elevated and if a germ (bacterial) or viral infection is present. Using this knowledge, an appropriate medicine which kills germs (antibiotic) can be chosen by your caregiver. A viral infection causing epididymitis will most often go away (resolve) without treatment. HOME CARE INSTRUCTIONS   Hot sitz baths for 20 minutes, 4 times per day, may help relieve pain.  Only take over-the-counter or prescription medicines for pain, discomfort or fever as directed by your caregiver.  Take all medicines, including antibiotics, as directed. Take the antibiotics for the full prescribed length of time even if you are feeling better.  It is very important to keep all follow-up appointments. SEEK IMMEDIATE MEDICAL CARE IF:   You have a fever.  You have pain not relieved with medicines.  You have any worsening of your problems.  Your pain seems to come and go.  You develop pain, redness, and swelling in the scrotum and surrounding areas. MAKE SURE YOU:   Understand these instructions.  Will watch your condition.  Will get help right away if you are not doing well or get worse. Document Released: 02/10/2000 Document Revised: 05/07/2011 Document Reviewed: 12/30/2008 [facility] Patient Information 2015 [place], [place]. This information  is not intended to replace advice given to you by your health care provider. Make sure you discuss any questions you have with your health care provider.  Scrotal Swelling Scrotal swelling may occur on one or both sides of the scrotum. Pain may also occur with swelling. Possible causes of scrotal swelling include:   Injury.  Infection.  An ingrown hair or abrasion in the area.  Repeated rubbing from tight-fitting underwear.  Poor hygiene.  A weakened area in the muscles around the groin (hernia). A hernia can allow abdominal contents to push into the scrotum.  Fluid around the testicle (hydrocele).  Enlarged vein around the testicle (varicocele).  Certain medical treatments or existing conditions.  A recent genital surgery or procedure.  The spermatic cord becomes twisted in the scrotum, which cuts off blood supply (testicular torsion).  Testicular cancer. HOME CARE INSTRUCTIONS Once the cause of your scrotal swelling has been determined, you may be asked to monitor your scrotum for any changes. The following actions may help to alleviate any discomfort you are experiencing:  Rest and limit activity until the swelling goes away. Lying down is the preferred position.  Put ice on the scrotum:  Put ice in a plastic bag.  Place a towel between your skin and the bag.  Leave the ice on for 20 minutes, 2-3 times a day for 1-2 days.  Place a rolled towel under the testicles for support.  Wear loose-fitting clothing or an athletic support cup for comfort.  Take all medicines as directed by your health care provider.  Perform a monthly self-exam of the scrotum and penis. Feel for changes. Ask your health care provider  how to perform a monthly self-exam if you are unsure. SEEK MEDICAL CARE IF:  You have a sudden (acute) onset of pain that is persistent and not improving.  You notice a heavy feeling or fluid in the scrotum.  You have pain or burning while urinating.  You  have blood in the urine or semen.  You feel a lump around the testicle.  You notice that one testicle is larger than the other (slight variation is normal).  You have a persistent dull ache or pain in the groin or scrotum. SEEK IMMEDIATE MEDICAL CARE IF:  The pain does not go away or becomes severe.  You have a fever or shaking chills.  You have pain or vomiting that cannot be controlled.  You notice significant redness or swelling of one or both sides of the scrotum.  You experience redness spreading upward from your scrotum to your abdomen or downward from your scrotum to your thighs. MAKE SURE YOU:  Understand these instructions.  Will watch your condition.  Will get help right away if you are not doing well or get worse. Document Released: 03/17/2010 Document Revised: 10/15/2012 Document Reviewed: 07/17/2012 [facility] Patient Information 2015 [place], [place]. This information is not intended to replace advice given to you by your health care provider. Make sure you discuss any questions you have with your health care provider.

## 2014-01-20 NOTE — ED Notes (Signed)
Patient is alert and orientedx4.  Patient was explained discharge instructions and they understood them with no questions.  The patient's wife, Mrs. Goodlin is taking the patient home.

## 2014-05-19 ENCOUNTER — Encounter: Payer: Self-pay | Admitting: Internal Medicine

## 2014-05-19 ENCOUNTER — Ambulatory Visit: Payer: BLUE CROSS/BLUE SHIELD | Attending: Internal Medicine | Admitting: Internal Medicine

## 2014-05-19 VITALS — BP 132/86 | HR 89 | Temp 98.2°F | Resp 16 | Ht 72.0 in | Wt 247.0 lb

## 2014-05-19 DIAGNOSIS — I10 Essential (primary) hypertension: Secondary | ICD-10-CM | POA: Diagnosis not present

## 2014-05-19 DIAGNOSIS — M109 Gout, unspecified: Secondary | ICD-10-CM | POA: Diagnosis not present

## 2014-05-19 DIAGNOSIS — E039 Hypothyroidism, unspecified: Secondary | ICD-10-CM | POA: Diagnosis not present

## 2014-05-19 DIAGNOSIS — E1142 Type 2 diabetes mellitus with diabetic polyneuropathy: Secondary | ICD-10-CM

## 2014-05-19 DIAGNOSIS — E119 Type 2 diabetes mellitus without complications: Secondary | ICD-10-CM | POA: Diagnosis not present

## 2014-05-19 DIAGNOSIS — E1140 Type 2 diabetes mellitus with diabetic neuropathy, unspecified: Secondary | ICD-10-CM | POA: Diagnosis not present

## 2014-05-19 DIAGNOSIS — K429 Umbilical hernia without obstruction or gangrene: Secondary | ICD-10-CM | POA: Diagnosis not present

## 2014-05-19 DIAGNOSIS — R1012 Left upper quadrant pain: Secondary | ICD-10-CM | POA: Insufficient documentation

## 2014-05-19 LAB — POCT GLYCOSYLATED HEMOGLOBIN (HGB A1C): Hemoglobin A1C: 7.7

## 2014-05-19 LAB — GLUCOSE, POCT (MANUAL RESULT ENTRY): POC GLUCOSE: 150 mg/dL — AB (ref 70–99)

## 2014-05-19 MED ORDER — ALLOPURINOL 300 MG PO TABS: 300.0000 mg | ORAL_TABLET | Freq: Every day | ORAL | Status: DC

## 2014-05-19 MED ORDER — GABAPENTIN 300 MG PO CAPS: 300.0000 mg | ORAL_CAPSULE | Freq: Three times a day (TID) | ORAL | Status: DC

## 2014-05-19 MED ORDER — METFORMIN HCL 500 MG PO TABS: 500.0000 mg | ORAL_TABLET | Freq: Two times a day (BID) | ORAL | Status: DC

## 2014-05-19 MED ORDER — LISINOPRIL 20 MG PO TABS: 20.0000 mg | ORAL_TABLET | Freq: Every day | ORAL | Status: DC

## 2014-05-19 MED ORDER — AMLODIPINE BESYLATE 10 MG PO TABS: 10.0000 mg | ORAL_TABLET | Freq: Every day | ORAL | Status: DC

## 2014-05-19 NOTE — Progress Notes (Signed)
Patient ID: Ray Garner, male   DOB: July 28, 1955, 59 y.o.   MRN: [id number] SUBJECTIVE: 59 y.o. male for follow up of diabetes. Diabetic Review of Systems - medication compliance: compliant all of the time, diabetic diet compliance: compliant most of the time, home glucose monitoring: is performed sporadically, further diabetic ROS: no polyuria or polydipsia, no chest pain, dyspnea or TIA's, sone numbness, tingling or pain in extremities, last eye exam approximately 1 year ago.  Other symptoms and concerns: Increased tingling in his right index fingers, left hand, and BLE. He reports that his feet burn and tingling all day and night.  Current Outpatient Prescriptions  Medication Sig Dispense Refill  . allopurinol (ZYLOPRIM) 300 MG tablet Take 1 tablet (300 mg total) by mouth daily. 90 tablet 3  . amLODipine (NORVASC) 10 MG tablet Take 1 tablet (10 mg total) by mouth daily. 90 tablet 3  . cyclobenzaprine (FLEXERIL) 10 MG tablet Take 1 tablet (10 mg total) by mouth at bedtime. (Patient not taking: Reported on 01/19/2014) 4 tablet 0  . doxycycline (VIBRA-TABS) 100 MG tablet Take 1 tablet (100 mg total) by mouth 2 (two) times daily. 20 tablet 0  . levofloxacin (LEVAQUIN) 750 MG tablet Take 1 tablet (750 mg total) by mouth daily. 10 tablet 0  . levothyroxine (SYNTHROID, LEVOTHROID) 150 MCG tablet Take 150 mcg by mouth daily before breakfast.    . lisinopril (PRINIVIL,ZESTRIL) 20 MG tablet Take 1 tablet (20 mg total) by mouth daily. 90 tablet 3  . metFORMIN (GLUCOPHAGE) 500 MG tablet Take 1 tablet (500 mg total) by mouth 2 (two) times daily with a meal. 180 tablet 3  . metroNIDAZOLE (FLAGYL) 500 MG tablet Take 4 tablets (2,000 mg total) by mouth once. (Patient not taking: Reported on 01/19/2014) 4 tablet 0  . omeprazole (PRILOSEC) 20 MG capsule Take 2 capsules (40 mg total) by mouth daily. 30 capsule 0  . oxyCODONE-acetaminophen (PERCOCET/ROXICET) 5-325 MG per tablet Take 2 tablets by mouth every 4 (four)  hours as needed for moderate pain or severe pain. 6 tablet 0  . vitamin B-12 (CYANOCOBALAMIN) 1000 MCG tablet Take 1,000 mcg by mouth daily.    . Vitamin D, Ergocalciferol, (DRISDOL) 50000 UNITS CAPS capsule Take 1 capsule (50,000 Units total) by mouth every 7 (seven) days. (Patient not taking: Reported on 01/19/2014) 4 capsule 6   No current facility-administered medications for this visit.    OBJECTIVE: Appearance: alert, well appearing, and in no distress, oriented to person, place, and time and overweight. BP 149/92 mmHg  Pulse 89  Temp(Src) 98.2 F (36.8 C) (Oral)  Resp 16  Ht 6' (1.829 m)  Wt 247 lb (112.038 kg)  BMI 33.49 kg/m2  SpO2 94%  Exam: heart sounds normal rate, regular rhythm, normal S1, S2, no murmurs, rubs, clicks or gallops, no JVD, chest clear, no carotid bruits   Ray Garner was seen today for follow-up.  Diagnoses and all orders for this visit:  Type 2 diabetes mellitus without complication Orders: -     Glucose (CBG) -     HgB A1c -     Lipid panel; Future -     COMPLETE METABOLIC PANEL WITH GFR; Future -     metFORMIN (GLUCOPHAGE) 500 MG tablet; Take 1 tablet (500 mg total) by mouth 2 (two) times daily with a meal. Patients diabetes is well control as evidence by consistently low a1c.  Patient will continue with current therapy and continue to make necessary lifestyle changes.  Reviewed foot care, diet,  exercise, annual health maintenance with patient.   Essential hypertension Orders: -     amLODipine (NORVASC) 10 MG tablet; Take 1 tablet (10 mg total) by mouth daily. -     lisinopril (PRINIVIL,ZESTRIL) 20 MG tablet; Take 1 tablet (20 mg total) by mouth daily. Patient blood pressure is stable and may continue on current medication.  Education on diet, exercise, and modifiable risk factors discussed. Will obtain appropriate labs as needed. Will follow up in 3-6 months.   Hypothyroidism, unspecified hypothyroidism type Orders: -     TSH; Future -     T4,  Free; Future Will refill synthroid once levels result  Gout without tophus, unspecified cause, unspecified chronicity, unspecified site Orders: -     Refill allopurinol (ZYLOPRIM) 300 MG tablet; Take 1 tablet (300 mg total) by mouth daily.  Umbilical hernia without obstruction and without gangrene Explained that I may place surgical consult whenever he is ready for repair  LUQ pain Orders: -     US Abdomen Limited; Future  Diabetic Neuropathy -     gabapentin (NEURONTIN) 300 MG capsule; Take 1 capsule (300 mg total) by mouth 3 (three) times daily.  Return in about 1 day (around 05/20/2014) for Lab Visit and 3 mo PCP, DM/HTN.  Chari Manning, NP 06/13/2014 10:39 PM

## 2014-05-19 NOTE — Progress Notes (Signed)
Pt is here following up on his diabetes. Pt states that he is still having the numbness in his left arm hand and fingers. Pt said that now his right index finger is numb.

## 2014-05-19 NOTE — Patient Instructions (Signed)
Diabetes and Standards of Medical Care Diabetes is complicated. You may find that your diabetes team includes a dietitian, nurse, diabetes educator, eye doctor, and more. To help everyone know what is going on and to help you get the care you deserve, the following schedule of care was developed to help keep you on track. Below are the tests, exams, vaccines, medicines, education, and plans you will need. HbA1c test This test shows how well you have controlled your glucose over the past 2-3 months. It is used to see if your diabetes management plan needs to be adjusted.   It is performed at least 2 times a year if you are meeting treatment goals.  It is performed 4 times a year if therapy has changed or if you are not meeting treatment goals. Blood pressure test  This test is performed at every routine medical visit. The goal is less than 140/90 mm Hg for most people, but 130/80 mm Hg in some cases. Ask your health care provider about your goal. Dental exam  Follow up with the dentist regularly. Eye exam  If you are diagnosed with type 1 diabetes as a child, get an exam upon reaching the age of 37 years or older and have had diabetes for 3-5 years. Yearly eye exams are recommended after that initial eye exam.  If you are diagnosed with type 1 diabetes as an adult, get an exam within 5 years of diagnosis and then yearly.  If you are diagnosed with type 2 diabetes, get an exam as soon as possible after the diagnosis and then yearly. Foot care exam  Visual foot exams are performed at every routine medical visit. The exams check for cuts, injuries, or other problems with the feet.  A comprehensive foot exam should be done yearly. This includes visual inspection as well as assessing foot pulses and testing for loss of sensation.  Check your feet nightly for cuts, injuries, or other problems with your feet. Tell your health care provider if anything is not healing. Kidney function test (urine  microalbumin)  This test is performed once a year.  Type 1 diabetes: The first test is performed 5 years after diagnosis.  Type 2 diabetes: The first test is performed at the time of diagnosis.  A serum creatinine and estimated glomerular filtration rate (eGFR) test is done once a year to assess the level of chronic kidney disease (CKD), if present. Lipid profile (cholesterol, HDL, LDL, triglycerides)  Performed every 5 years for most people.  The goal for LDL is less than 100 mg/dL. If you are at high risk, the goal is less than 70 mg/dL.  The goal for HDL is 40 mg/dL-50 mg/dL for men and 50 mg/dL-60 mg/dL for women. An HDL cholesterol of 60 mg/dL or higher gives some protection against heart disease.  The goal for triglycerides is less than 150 mg/dL. Influenza vaccine, pneumococcal vaccine, and hepatitis B vaccine  The influenza vaccine is recommended yearly.  It is recommended that people with diabetes who are over 24 years old get the pneumonia vaccine. In some cases, two separate shots may be given. Ask your health care provider if your pneumonia vaccination is up to date.  The hepatitis B vaccine is also recommended for adults with diabetes. Diabetes self-management education  Education is recommended at diagnosis and ongoing as needed. Treatment plan  Your treatment plan is reviewed at every medical visit. Document Released: 12/10/2008 Document Revised: 06/29/2013 Document Reviewed: 07/15/2012 [facility] Patient Information 2015 [place],  [employer]. This information is not intended to replace advice given to you by your health care provider. Make sure you discuss any questions you have with your health care provider.  

## 2014-05-20 ENCOUNTER — Ambulatory Visit: Payer: Self-pay | Attending: Internal Medicine

## 2014-05-20 DIAGNOSIS — E039 Hypothyroidism, unspecified: Secondary | ICD-10-CM

## 2014-05-20 DIAGNOSIS — E119 Type 2 diabetes mellitus without complications: Secondary | ICD-10-CM

## 2014-05-20 LAB — COMPLETE METABOLIC PANEL WITH GFR
ALK PHOS: 63 U/L (ref 39–117)
ALT: 22 U/L (ref 0–53)
AST: 31 U/L (ref 0–37)
Albumin: 4.3 g/dL (ref 3.5–5.2)
BILIRUBIN TOTAL: 0.5 mg/dL (ref 0.2–1.2)
BUN: 15 mg/dL (ref 6–23)
CO2: 26 mEq/L (ref 19–32)
CREATININE: 1.01 mg/dL (ref 0.50–1.35)
Calcium: 9.3 mg/dL (ref 8.4–10.5)
Chloride: 101 mEq/L (ref 96–112)
GFR, Est African American: >89 mL/min
GFR, Est Non African American: 82 mL/min
Glucose, Bld: 157 mg/dL — ABNORMAL HIGH (ref 70–99)
Potassium: 4.2 mEq/L (ref 3.5–5.3)
Sodium: 137 mEq/L (ref 135–145)
TOTAL PROTEIN: 7.2 g/dL (ref 6.0–8.3)

## 2014-05-20 LAB — LIPID PANEL
Cholesterol: 162 mg/dL (ref 0–200)
HDL: 33 mg/dL — AB (ref >=40)
LDL CALC: 104 mg/dL — AB (ref 0–99)
TRIGLYCERIDES: 124 mg/dL (ref <150)
Total CHOL/HDL Ratio: 4.9 Ratio
VLDL: 25 mg/dL (ref 0–40)

## 2014-05-20 LAB — TSH: TSH: 16.235 u[IU]/mL — AB (ref 0.350–4.500)

## 2014-05-20 LAB — T4, FREE: Free T4: 0.6 ng/dL — ABNORMAL LOW (ref 0.80–1.80)

## 2014-05-28 ENCOUNTER — Telehealth: Payer: Self-pay | Admitting: Internal Medicine

## 2014-05-28 NOTE — Telephone Encounter (Signed)
Patient has an appointment on Monday for an ultrasound but he stated that he could not make it and would like the appointment rescheduled, please f/u with pt.

## 2014-05-31 ENCOUNTER — Ambulatory Visit (HOSPITAL_COMMUNITY): Admission: RE | Admit: 2014-05-31 | Payer: BLUE CROSS/BLUE SHIELD | Source: Ambulatory Visit

## 2014-09-15 ENCOUNTER — Ambulatory Visit: Payer: BLUE CROSS/BLUE SHIELD | Attending: Internal Medicine | Admitting: Internal Medicine

## 2014-09-15 ENCOUNTER — Encounter: Payer: Self-pay | Admitting: Internal Medicine

## 2014-09-15 VITALS — BP 131/85 | HR 77 | Temp 98.4°F | Resp 20 | Ht 72.0 in | Wt 267.0 lb

## 2014-09-15 DIAGNOSIS — I10 Essential (primary) hypertension: Secondary | ICD-10-CM | POA: Diagnosis not present

## 2014-09-15 DIAGNOSIS — E039 Hypothyroidism, unspecified: Secondary | ICD-10-CM

## 2014-09-15 DIAGNOSIS — M109 Gout, unspecified: Secondary | ICD-10-CM | POA: Diagnosis not present

## 2014-09-15 DIAGNOSIS — R1084 Generalized abdominal pain: Secondary | ICD-10-CM

## 2014-09-15 DIAGNOSIS — E1165 Type 2 diabetes mellitus with hyperglycemia: Secondary | ICD-10-CM

## 2014-09-15 DIAGNOSIS — K5900 Constipation, unspecified: Secondary | ICD-10-CM

## 2014-09-15 DIAGNOSIS — K219 Gastro-esophageal reflux disease without esophagitis: Secondary | ICD-10-CM

## 2014-09-15 DIAGNOSIS — K921 Melena: Secondary | ICD-10-CM

## 2014-09-15 DIAGNOSIS — Z87891 Personal history of nicotine dependence: Secondary | ICD-10-CM | POA: Insufficient documentation

## 2014-09-15 LAB — GLUCOSE, POCT (MANUAL RESULT ENTRY)
POC Glucose: 240 mg/dl — AB (ref 70–99)
POC Glucose: 265 mg/dl — AB (ref 70–99)

## 2014-09-15 LAB — POCT GLYCOSYLATED HEMOGLOBIN (HGB A1C): HEMOGLOBIN A1C: 9.6

## 2014-09-15 MED ORDER — INSULIN ASPART 100 UNIT/ML ~~LOC~~ SOLN
10.0000 [IU] | Freq: Once | SUBCUTANEOUS | Status: AC
  Administered 2014-09-15: 10 [IU] via SUBCUTANEOUS

## 2014-09-15 MED ORDER — LISINOPRIL 20 MG PO TABS: 20.0000 mg | ORAL_TABLET | Freq: Every day | ORAL | Status: DC

## 2014-09-15 MED ORDER — ALLOPURINOL 300 MG PO TABS: 300.0000 mg | ORAL_TABLET | Freq: Every day | ORAL | Status: DC

## 2014-09-15 MED ORDER — AMLODIPINE BESYLATE 10 MG PO TABS: 10.0000 mg | ORAL_TABLET | Freq: Every day | ORAL | Status: DC

## 2014-09-15 MED ORDER — METFORMIN HCL 500 MG PO TABS: 1000.0000 mg | ORAL_TABLET | Freq: Two times a day (BID) | ORAL | Status: DC

## 2014-09-15 MED ORDER — OMEPRAZOLE 20 MG PO CPDR: 20.0000 mg | DELAYED_RELEASE_CAPSULE | Freq: Every day | ORAL | Status: DC

## 2014-09-15 MED ORDER — POLYETHYLENE GLYCOL 3350 17 GM/SCOOP PO POWD: 17.0000 g | Freq: Every day | ORAL | Status: DC

## 2014-09-15 NOTE — Patient Instructions (Addendum)
Call me back once you find out if you are going to do the ultrasound and I will have a nurse to schedule for you. Thanks  I will refill Synthroid once I get blood work results   Diabetes Mellitus and Food It is important for you to manage your blood sugar (glucose) level. Your blood glucose level can be greatly affected by what you eat. Eating healthier foods in the appropriate amounts throughout the day at about the same time each day will help you control your blood glucose level. It can also help slow or prevent worsening of your diabetes mellitus. Healthy eating may even help you improve the level of your blood pressure and reach or maintain a healthy weight.  HOW CAN FOOD AFFECT ME? Carbohydrates Carbohydrates affect your blood glucose level more than any other type of food. Your dietitian will help you determine how many carbohydrates to eat at each meal and teach you how to count carbohydrates. Counting carbohydrates is important to keep your blood glucose at a healthy level, especially if you are using insulin or taking certain medicines for diabetes mellitus. Alcohol Alcohol can cause sudden decreases in blood glucose (hypoglycemia), especially if you use insulin or take certain medicines for diabetes mellitus. Hypoglycemia can be a life-threatening condition. Symptoms of hypoglycemia (sleepiness, dizziness, and disorientation) are similar to symptoms of having too much alcohol.  If your health care provider has given you approval to drink alcohol, do so in moderation and use the following guidelines:  Women should not have more than one drink per day, and men should not have more than two drinks per day. One drink is equal to:  12 oz of beer.  5 oz of wine.  1 oz of hard liquor.  Do not drink on an empty stomach.  Keep yourself hydrated. Have water, diet soda, or unsweetened iced tea.  Regular soda, juice, and other mixers might contain a lot of carbohydrates and should be  counted. WHAT FOODS ARE NOT RECOMMENDED? As you make food choices, it is important to remember that all foods are not the same. Some foods have fewer nutrients per serving than other foods, even though they might have the same number of calories or carbohydrates. It is difficult to get your body what it needs when you eat foods with fewer nutrients. Examples of foods that you should avoid that are high in calories and carbohydrates but low in nutrients include:  Trans fats (most processed foods list trans fats on the Nutrition Facts label).  Regular soda.  Juice.  Candy.  Sweets, such as cake, pie, doughnuts, and cookies.  Fried foods. WHAT FOODS CAN I EAT? Have nutrient-rich foods, which will nourish your body and keep you healthy. The food you should eat also will depend on several factors, including:  The calories you need.  The medicines you take.  Your weight.  Your blood glucose level.  Your blood pressure level.  Your cholesterol level. You also should eat a variety of foods, including:  Protein, such as meat, poultry, fish, tofu, nuts, and seeds (lean animal proteins are best).  Fruits.  Vegetables.  Dairy products, such as milk, cheese, and yogurt (low fat is best).  Breads, grains, pasta, cereal, rice, and beans.  Fats such as olive oil, trans fat-free margarine, canola oil, avocado, and olives. DOES EVERYONE WITH DIABETES MELLITUS HAVE THE SAME MEAL PLAN? Because every person with diabetes mellitus is different, there is not one meal plan that works for everyone. It  is very important that you meet with a dietitian who will help you create a meal plan that is just right for you. Document Released: 11/09/2004 Document Revised: 02/17/2013 Document Reviewed: 01/09/2013 [facility] Patient Information 2015 [place], [place]. This information is not intended to replace advice given to you by your health care provider. Make sure you discuss any questions you have with your  health care provider.

## 2014-09-15 NOTE — Progress Notes (Deleted)
   Subjective:    Patient ID: Ray Garner, male    DOB: 1955/09/12, 59 y.o.   MRN: [id number]  HPI    Review of Systems     Objective:   Physical Exam        Assessment & Plan:

## 2014-09-15 NOTE — Progress Notes (Signed)
Patient ID: Ray Garner, male   DOB: 1955/07/04, 59 y.o.   MRN: [id number]  CC: follow up  HPI: Ray Garner is a 59 y.o. male here today for a follow up visit.  Patient has past medical history of HTN, Gout, hypothyroidism, T2DM. Patient reports abdominal pain for several weeks. He states that after eating each meal he has slight regurgitation in his mouth. He notes epigastric pain. He states that he has continued to have LUQ pain from several months ago. He has not been eating because of the pain. Feels bloated often. He states that he was unable to go and have the abdominal ultrasound done after last office visit because he could not afford it.   Patient reports that he has been snacking more and drinking 2 large Gatorade bottles per day. He reports that he continues to take his Metformin twice daily without complications. He notes that he will have health coverage beginning August 1st.   Patient has No headache, No chest pain, No abdominal pain - No Nausea, No new weakness tingling or numbness, No Cough - SOB.  Allergies  Allergen Reactions  . Vicodin [Hydrocodone-Acetaminophen] Hives and Rash   Past Medical History  Diagnosis Date  . Hypertension   . Gout   . Arthritis   . Back pain, lumbosacral 06/30/2013  . DM type 2 (diabetes mellitus, type 2) 06/30/2013  . HTN (hypertension) 06/30/2013  . Hypothyroid 06/30/2013   Current Outpatient Prescriptions on File Prior to Visit  Medication Sig Dispense Refill  . allopurinol (ZYLOPRIM) 300 MG tablet Take 1 tablet (300 mg total) by mouth daily. 90 tablet 3  . amLODipine (NORVASC) 10 MG tablet Take 1 tablet (10 mg total) by mouth daily. 90 tablet 3  . gabapentin (NEURONTIN) 300 MG capsule Take 1 capsule (300 mg total) by mouth 3 (three) times daily. 90 capsule 3  . levothyroxine (SYNTHROID, LEVOTHROID) 150 MCG tablet Take 150 mcg by mouth daily before breakfast.    . lisinopril (PRINIVIL,ZESTRIL) 20 MG tablet Take 1 tablet (20 mg total) by mouth  daily. 90 tablet 3  . metFORMIN (GLUCOPHAGE) 500 MG tablet Take 1 tablet (500 mg total) by mouth 2 (two) times daily with a meal. 180 tablet 3  . omeprazole (PRILOSEC) 20 MG capsule Take 2 capsules (40 mg total) by mouth daily. 30 capsule 0  . vitamin B-12 (CYANOCOBALAMIN) 1000 MCG tablet Take 1,000 mcg by mouth daily.    . Vitamin D, Ergocalciferol, (DRISDOL) 50000 UNITS CAPS capsule Take 1 capsule (50,000 Units total) by mouth every 7 (seven) days. 4 capsule 6   No current facility-administered medications on file prior to visit.   History reviewed. No pertinent family history. History   Social History  . Marital Status: Divorced    Spouse Name: N/A  . Number of Children: N/A  . Years of Education: N/A   Occupational History  . Not on file.   Social History Main Topics  . Smoking status: Former Smoker    Quit date: 05/14/1983  . Smokeless tobacco: Not on file  . Alcohol Use: Yes  . Drug Use: No  . Sexual Activity: Not on file   Other Topics Concern  . Not on file   Social History Narrative    Review of Systems  Gastrointestinal: Positive for heartburn, nausea, vomiting, abdominal pain, constipation and blood in stool.  Genitourinary: Negative for frequency.  Neurological: Negative for tingling.  Endo/Heme/Allergies: Negative for polydipsia.  All other systems reviewed and are negative.  Objective:   Filed Vitals:   09/15/14 1233  BP: 131/85  Pulse: 77  Temp: 98.4 F (36.9 C)  Resp: 20    Physical Exam  Constitutional: He is oriented to person, place, and time.  Cardiovascular: Normal rate, regular rhythm and normal heart sounds.   Pulmonary/Chest: Effort normal and breath sounds normal.  Abdominal: Soft. Bowel sounds are normal. He exhibits no distension and no mass. There is tenderness (epigastric).  Musculoskeletal: Normal range of motion. He exhibits no edema.  Neurological: He is alert and oriented to person, place, and time.  Skin: Skin is warm and  dry.    Lab Results  Component Value Date   WBC 7.2 06/30/2013   HGB 13.8 06/30/2013   HCT 40.6 06/30/2013   MCV 86.4 06/30/2013   PLT 254 06/30/2013   Lab Results  Component Value Date   CREATININE 1.01 05/20/2014   BUN 15 05/20/2014   NA 137 05/20/2014   K 4.2 05/20/2014   CL 101 05/20/2014   CO2 26 05/20/2014    Lab Results  Component Value Date   HGBA1C 7.70 05/19/2014   Lipid Panel     Component Value Date/Time   CHOL 162 05/20/2014 0928   TRIG 124 05/20/2014 0928   HDL 33* 05/20/2014 0928   CHOLHDL 4.9 05/20/2014 0928   VLDL 25 05/20/2014 0928   LDLCALC 104* 05/20/2014 0928       Assessment and plan:   Diagnoses and all orders for this visit:  Type 2 diabetes mellitus with hyperglycemia Orders: -     POCT glycosylated hemoglobin (Hb A1C) -     POCT glucose (manual entry) -     Increased metFORMIN (GLUCOPHAGE) 500 MG tablet; Take 2 tablets (1,000 mg total) by mouth 2 (two) times daily with a meal. -     POCT glucose (manual entry) -     insulin aspart (novoLOG) injection 10 Units; Inject 0.1 mLs (10 Units total) into the skin once. Increased Metformin to 1,000 mg BID and stressed diet control. Patient will stop all gatorade drinks. I will reevaluate him in 3 months to see if he has had improvement   Essential hypertension Orders: -     lisinopril (PRINIVIL,ZESTRIL) 20 MG tablet; Take 1 tablet (20 mg total) by mouth daily. -     amLODipine (NORVASC) 10 MG tablet; Take 1 tablet (10 mg total) by mouth daily. Patient blood pressure is stable and may continue on current medication.  Education on diet, exercise, and modifiable risk factors discussed. Will obtain appropriate labs as needed. Will follow up in 3-6 months.   Hypothyroidism, unspecified hypothyroidism type Orders: -     TSH -     T4, Free Will recheck and send appropriate refill of synthroid  Generalized abdominal pain Orders: -     US Abdomen Complete; Future---LUQ and epigastric pain   Placed ultrasound order to see if he is having gallstones.   Blood in stool Orders: -     Ambulatory referral to Gastroenterology Needs Colonoscopy   Gastroesophageal reflux disease, esophagitis presence not specified Orders: -     Begin omeprazole (PRILOSEC) 20 MG capsule; Take 1 capsule (20 mg total) by mouth daily. Causing epigastric pain  Discussed diet and weight with patient relating to acid reflux.  Went over things that may exacerbate acid reflux such as tomatoes, spicy foods, coffee, carbonated beverages, chocolates, etc.  Advised patient to avoid laying down at least two hours after meals and sleep with HOB  elevated.   Constipation, unspecified constipation type Orders: -     polyethylene glycol powder (GLYCOLAX/MIRALAX) powder; Take 17 g by mouth daily. Explained that the daily recommended amount of fiber is 25 g, went over high fiber foods, encourage increased water intake, miralax use, and increased physical activity.  Patient given constipation handout.   Gout without tophus, unspecified cause, unspecified chronicity, unspecified site Orders: -     Refill allopurinol (ZYLOPRIM) 300 MG tablet; Take 1 tablet (300 mg total) by mouth daily. Asked patient to cut out can sardines due to possibility of gout flare  Return in about 3 months (around 12/16/2014).       Lance Bosch, [facility] [phone] 09/15/2014, 12:47 PM

## 2014-09-15 NOTE — Progress Notes (Signed)
Patient in for right upper abd pain and constipation.  Saw blood after straining for a BM.  Also having problem with regurgitating food after most meals.  Only has BM's once or twice a week.

## 2014-09-16 LAB — T4, FREE: Free T4: 0.92 ng/dL (ref 0.80–1.80)

## 2014-09-16 LAB — TSH: TSH: 4.839 u[IU]/mL — ABNORMAL HIGH (ref 0.350–4.500)

## 2014-09-17 ENCOUNTER — Telehealth: Payer: Self-pay

## 2014-09-17 DIAGNOSIS — E039 Hypothyroidism, unspecified: Secondary | ICD-10-CM

## 2014-09-17 NOTE — Telephone Encounter (Signed)
-----   Message from Lance Bosch, NP sent at 09/16/2014  9:22 AM EDT ----- Level has improved but still slightly elevated. Please send new Synthroid dose of 175 mcg to take daily before breakfast. We will check again in 8 weeks to see if he has had any improvement. Please have him to schedule lab visit and place future TSH order. Thanks

## 2014-09-17 NOTE — Telephone Encounter (Signed)
Nurse called patient, reached voicemail. Left message for patient to call Chrysten Woulfe with [facility], at [phone].  

## 2014-10-01 NOTE — Telephone Encounter (Signed)
Patient returning nurse call to receive his lab results. Transferred call to nurse. No answer. Please assist.

## 2014-10-04 MED ORDER — LEVOTHYROXINE SODIUM 175 MCG PO TABS: 175.0000 ug | ORAL_TABLET | Freq: Every day | ORAL | Status: DC

## 2014-10-04 NOTE — Telephone Encounter (Signed)
-----   Message from Lance Bosch, NP sent at 09/16/2014  9:22 AM EDT ----- Level has improved but still slightly elevated. Please send new Synthroid dose of 175 mcg to take daily before breakfast. We will check again in 8 weeks to see if he has had any improvement. Please have him to schedule lab visit and place future TSH order. Thanks

## 2014-10-04 NOTE — Telephone Encounter (Signed)
Nurse called patient, patient verified date of birth. Patient aware of improved but still slightly elevated level.  Patient agrees to pick up Synthroid 19mcg to take daily before breakfast from [facility]. Patient agrees to have labs redrawn in 8 weeks and will call back for lab appointment.  Patient voices understanding and has no further questions at this time. Prescription sent to pharmacy and future TSH order placed.

## 2014-12-21 ENCOUNTER — Other Ambulatory Visit: Payer: Self-pay | Admitting: Internal Medicine

## 2015-01-19 ENCOUNTER — Ambulatory Visit: Payer: 59 | Attending: Internal Medicine | Admitting: Pharmacist

## 2015-01-19 ENCOUNTER — Telehealth: Payer: Self-pay

## 2015-01-19 ENCOUNTER — Other Ambulatory Visit: Payer: Self-pay | Admitting: Internal Medicine

## 2015-01-19 ENCOUNTER — Encounter: Payer: Self-pay | Admitting: Pharmacist

## 2015-01-19 VITALS — BP 154/93 | HR 87 | Wt 270.4 lb

## 2015-01-19 DIAGNOSIS — E039 Hypothyroidism, unspecified: Secondary | ICD-10-CM | POA: Insufficient documentation

## 2015-01-19 DIAGNOSIS — E1165 Type 2 diabetes mellitus with hyperglycemia: Secondary | ICD-10-CM

## 2015-01-19 LAB — BASIC METABOLIC PANEL
BUN: 11 mg/dL (ref 7–25)
CALCIUM: 9 mg/dL (ref 8.6–10.3)
CO2: 23 mmol/L (ref 20–31)
Chloride: 103 mmol/L (ref 98–110)
Creat: 0.78 mg/dL (ref 0.70–1.33)
GLUCOSE: 182 mg/dL — AB (ref 65–99)
POTASSIUM: 4.4 mmol/L (ref 3.5–5.3)
SODIUM: 137 mmol/L (ref 135–146)

## 2015-01-19 LAB — POCT GLYCOSYLATED HEMOGLOBIN (HGB A1C): HEMOGLOBIN A1C: 9.4

## 2015-01-19 LAB — TSH: TSH: 1.216 u[IU]/mL (ref 0.350–4.500)

## 2015-01-19 MED ORDER — CANAGLIFLOZIN 100 MG PO TABS: 100.0000 mg | ORAL_TABLET | Freq: Every day | ORAL | Status: DC

## 2015-01-19 MED ORDER — GLIPIZIDE ER 5 MG PO TB24: 5.0000 mg | ORAL_TABLET | Freq: Every day | ORAL | Status: DC

## 2015-01-19 NOTE — Progress Notes (Signed)
S:    Patient arrives in good spirits.  Presents for diabetes follow up.   Patient reports adherence with medications. Current diabetes medications include metformin 1000 mg BID.   Patient denies hypoglycemic events.  Patient reported dietary habits: doesn't follow any particular diet and acknowledges that he has been eating unhealthy foods due to the holidays.   Patient reported exercise habits: none   Patient reports nocturia.  Patient denies neuropathy. Patient denies visual changes. Patient reports self foot exams.    O:  Lab Results  Component Value Date   HGBA1C 9.40 01/19/2015    Home fasting CBG: 230s - 260s 2 hour post-prandial/random CBG: 190s - 310s  A/P: Diabetes currently uncontrolled based on A1c of 9.4 and home blood glucose readings.   Patient denies hypoglycemic events and is able to verbalize appropriate hypoglycemia management plan.  Patient reports adherence with medication. Control is suboptimal due to dietary indiscretion and sedentary lifestyle.  Discussed case with Chari Manning - initiate Invokana 100 mg daily. Counseled on use of medication, mechanism of action, and possible adverse effects. Invokana will also hopefully help lower his blood pressure some. Will obtain BMET and A1c today. Continue metformin 1000 mg BID. Patient counseled on goal blood glucose for fasting and post-prandial. Patient aware he is eating poorly due to holidays and is going to try to eat healthier.   Next A1C anticipated February 2017.    Written patient instructions provided.  Total time in face to face counseling 20 minutes.   Follow up in with Chari Manning, NP, in 2 weeks.

## 2015-01-19 NOTE — Patient Instructions (Signed)
Thanks for coming to see me today!  Get your lab work done on the way out.  Start Invokana one tablet daily. Come back to see Chari Manning in 2 weeks

## 2015-01-19 NOTE — Telephone Encounter (Signed)
Returned patient phone call  Patient stated he was prescribed invokana but even with the insurance The cost was over $400 dollars and patient can not afford this Is there something else we can prescribe in place of this

## 2015-01-24 ENCOUNTER — Telehealth: Payer: Self-pay

## 2015-01-24 NOTE — Telephone Encounter (Signed)
-----   Message from Lance Bosch, NP sent at 01/19/2015  5:53 PM EST ----- Thyroid is normal. May stay on same dose

## 2015-01-24 NOTE — Telephone Encounter (Signed)
Spoke with patient and he is aware of his thyroid results

## 2015-02-02 ENCOUNTER — Telehealth: Payer: Self-pay | Admitting: *Deleted

## 2015-02-02 NOTE — Telephone Encounter (Signed)
Patient verified DOB Patient was informed of his BMP being normal. Patient advised to continue controlling his blood sugar. Medical Assistant reiterated the importance of diet monitoring and implementing exercise at 30 minute intervals 3 times a week as tolerated. Patient was transferred to the front desk to schedule FU appointment with Chari Manning per visit with Erline Levine on 01/19/15. Patient had no further questions and expressed his understanding.

## 2015-02-02 NOTE — Telephone Encounter (Signed)
-----   Message from Tresa Garter, MD sent at 01/28/2015  5:57 PM EST ----- Basic metabolic panel is normal. Continue blood sugar control. Emphasize diet and exercise

## 2015-02-11 ENCOUNTER — Encounter: Payer: Self-pay | Admitting: Internal Medicine

## 2015-02-11 ENCOUNTER — Ambulatory Visit: Payer: 59 | Attending: Internal Medicine | Admitting: Internal Medicine

## 2015-02-11 VITALS — BP 136/78 | HR 77 | Temp 98.2°F | Resp 18 | Ht 72.0 in | Wt 273.0 lb

## 2015-02-11 DIAGNOSIS — M1000 Idiopathic gout, unspecified site: Secondary | ICD-10-CM | POA: Diagnosis not present

## 2015-02-11 DIAGNOSIS — Z1211 Encounter for screening for malignant neoplasm of colon: Secondary | ICD-10-CM | POA: Diagnosis not present

## 2015-02-11 DIAGNOSIS — E039 Hypothyroidism, unspecified: Secondary | ICD-10-CM | POA: Diagnosis not present

## 2015-02-11 DIAGNOSIS — E1165 Type 2 diabetes mellitus with hyperglycemia: Secondary | ICD-10-CM | POA: Diagnosis present

## 2015-02-11 DIAGNOSIS — Z87891 Personal history of nicotine dependence: Secondary | ICD-10-CM | POA: Insufficient documentation

## 2015-02-11 DIAGNOSIS — Z7984 Long term (current) use of oral hypoglycemic drugs: Secondary | ICD-10-CM | POA: Diagnosis not present

## 2015-02-11 DIAGNOSIS — I10 Essential (primary) hypertension: Secondary | ICD-10-CM | POA: Diagnosis not present

## 2015-02-11 DIAGNOSIS — Z79899 Other long term (current) drug therapy: Secondary | ICD-10-CM | POA: Insufficient documentation

## 2015-02-11 DIAGNOSIS — M109 Gout, unspecified: Secondary | ICD-10-CM | POA: Insufficient documentation

## 2015-02-11 LAB — GLUCOSE, POCT (MANUAL RESULT ENTRY): POC GLUCOSE: 114 mg/dL — AB (ref 70–99)

## 2015-02-11 MED ORDER — METFORMIN HCL 500 MG PO TABS: 1000.0000 mg | ORAL_TABLET | Freq: Two times a day (BID) | ORAL | Status: DC

## 2015-02-11 MED ORDER — LISINOPRIL 20 MG PO TABS: 20.0000 mg | ORAL_TABLET | Freq: Every day | ORAL | Status: DC

## 2015-02-11 MED ORDER — GLIPIZIDE ER 5 MG PO TB24: 5.0000 mg | ORAL_TABLET | Freq: Every day | ORAL | Status: DC

## 2015-02-11 MED ORDER — AMLODIPINE BESYLATE 10 MG PO TABS: 10.0000 mg | ORAL_TABLET | Freq: Every day | ORAL | Status: DC

## 2015-02-11 NOTE — Progress Notes (Signed)
Patient ID: Ray Garner, male   DOB: 16-Nov-1955, 59 y.o.   MRN: [id number]  CC: DM f/u  HPI: Ray Garner is a 59 y.o. male here today for a follow up visit.  Patient has past medical history of hypertension, gout, arthritis, thyroid disease, and diabetes. Patient was seen 3 weeks ago and was placed on Invokana and was switched to glipizide 5 mg daily due to cost. He states that since he was placed on on Glipizide he has had some improvement in blood sugars. He reports fasting blood sugars have been 140-159. He has not checked any post-prandial sugars. Patient has never had a colonoscopy.  Allergies  Allergen Reactions  . Vicodin [Hydrocodone-Acetaminophen] Hives and Rash   Past Medical History  Diagnosis Date  . Hypertension   . Gout   . Arthritis   . Back pain, lumbosacral 06/30/2013  . DM type 2 (diabetes mellitus, type 2) ([place]) 06/30/2013  . HTN (hypertension) 06/30/2013  . Hypothyroid 06/30/2013   Current Outpatient Prescriptions on File Prior to Visit  Medication Sig Dispense Refill  . allopurinol (ZYLOPRIM) 300 MG tablet Take 1 tablet (300 mg total) by mouth daily. 90 tablet 3  . amLODipine (NORVASC) 10 MG tablet Take 1 tablet (10 mg total) by mouth daily. 90 tablet 3  . gabapentin (NEURONTIN) 300 MG capsule Take 1 capsule (300 mg total) by mouth 3 (three) times daily. 90 capsule 3  . glipiZIDE (GLIPIZIDE XL) 5 MG 24 hr tablet Take 1 tablet (5 mg total) by mouth daily with breakfast. 90 tablet 1  . levothyroxine (SYNTHROID, LEVOTHROID) 175 MCG tablet TAKE ONE TABLET BY MOUTH ONCE DAILY BEFORE  BREAKFAST 90 tablet 0  . lisinopril (PRINIVIL,ZESTRIL) 20 MG tablet Take 1 tablet (20 mg total) by mouth daily. 90 tablet 3  . metFORMIN (GLUCOPHAGE) 500 MG tablet Take 2 tablets (1,000 mg total) by mouth 2 (two) times daily with a meal. 180 tablet 3  . omeprazole (PRILOSEC) 20 MG capsule Take 1 capsule (20 mg total) by mouth daily. 30 capsule 5  . polyethylene glycol powder (GLYCOLAX/MIRALAX) powder  Take 17 g by mouth daily. 850 g 1  . vitamin B-12 (CYANOCOBALAMIN) 1000 MCG tablet Take 1,000 mcg by mouth daily.     No current facility-administered medications on file prior to visit.   History reviewed. No pertinent family history. Social History   Social History  . Marital Status: Divorced    Spouse Name: N/A  . Number of Children: N/A  . Years of Education: N/A   Occupational History  . Not on file.   Social History Main Topics  . Smoking status: Former Smoker    Quit date: 05/14/1983  . Smokeless tobacco: Not on file  . Alcohol Use: Yes  . Drug Use: No  . Sexual Activity: Not on file   Other Topics Concern  . Not on file   Social History Narrative    Review of Systems: Constitutional: Negative for fever, chills, diaphoresis, activity change, appetite change and fatigue. HENT: Negative for ear pain, nosebleeds, congestion, facial swelling, rhinorrhea, neck pain, neck stiffness and ear discharge.  Eyes: Negative for pain, discharge, redness, itching and visual disturbance. Respiratory: Negative for cough, choking, chest tightness, shortness of breath, wheezing and stridor.  Cardiovascular: Negative for chest pain, palpitations and leg swelling. Gastrointestinal: Negative for abdominal distention. Genitourinary: Negative for dysuria, urgency, frequency, hematuria, flank pain, decreased urine volume, difficulty urinating and dyspareunia.  Musculoskeletal: Negative for back pain, joint swelling, arthralgias and gait problem.  Neurological: Negative for dizziness, tremors, seizures, syncope, facial asymmetry, speech difficulty, weakness, light-headedness, numbness and headaches.  Hematological: Negative for adenopathy. Does not bruise/bleed easily. Psychiatric/Behavioral: Negative for hallucinations, behavioral problems, confusion, dysphoric mood, decreased concentration and agitation.    Objective:   Filed Vitals:   02/11/15 1406  BP: 136/78  Pulse: 77  Temp: 98.2  F (36.8 C)  Resp: 18    Physical Exam  Constitutional: He is oriented to person, place, and time.  Cardiovascular: Normal rate, regular rhythm and normal heart sounds.   Pulmonary/Chest: Effort normal and breath sounds normal.  Musculoskeletal: He exhibits no edema or tenderness.  Neurological: He is alert and oriented to person, place, and time.  Skin: Skin is warm and dry.  Psychiatric: He has a normal mood and affect.     Lab Results  Component Value Date   WBC 7.2 06/30/2013   HGB 13.8 06/30/2013   HCT 40.6 06/30/2013   MCV 86.4 06/30/2013   PLT 254 06/30/2013   Lab Results  Component Value Date   CREATININE 0.78 01/19/2015   BUN 11 01/19/2015   NA 137 01/19/2015   K 4.4 01/19/2015   CL 103 01/19/2015   CO2 23 01/19/2015    Lab Results  Component Value Date   HGBA1C 9.40 01/19/2015   Lipid Panel     Component Value Date/Time   CHOL 162 05/20/2014 0928   TRIG 124 05/20/2014 0928   HDL 33* 05/20/2014 0928   CHOLHDL 4.9 05/20/2014 0928   VLDL 25 05/20/2014 0928   LDLCALC 104* 05/20/2014 0928       Assessment and plan:   Ray Garner was seen today for diabetes.  Diagnoses and all orders for this visit:  Type 2 diabetes mellitus with hyperglycemia, without long-term current use of insulin (HCC) -     Glucose (CBG) -     metFORMIN (GLUCOPHAGE) 500 MG tablet; Take 2 tablets (1,000 mg total) by mouth 2 (two) times daily with a meal. -     glipiZIDE (GLIPIZIDE XL) 5 MG 24 hr tablet; Take 1 tablet (5 mg total) by mouth daily with breakfast. Diabetes is improving according to reported sugars. I will not make any changes in medications since patient did not bring a log book with him. I will have him return with log and changes will be made at that time.  Essential hypertension -     lisinopril (PRINIVIL,ZESTRIL) 20 MG tablet; Take 1 tablet (20 mg total) by mouth daily. -     amLODipine (NORVASC) 10 MG tablet; Take 1 tablet (10 mg total) by mouth daily. Patient  blood pressure is stable and may continue on current medication.  Education on diet, exercise, and modifiable risk factors discussed. Will obtain appropriate labs as needed. Will follow up in 3-6 months.   Colon cancer screening -     Ambulatory referral to Gastroenterology   Return in about 3 weeks (around 03/04/2015) for Stacy---log review and end of February with PCP.       Lance Bosch, [facility] [phone] 02/11/2015, 2:33 PM

## 2015-02-11 NOTE — Progress Notes (Signed)
Patient's here for f/up DM. Patient states he's feeling "ok".  Patient reports his reading for his diabetes at home is still slightly elevated.  Patient requesting medication refills.

## 2015-02-18 NOTE — Telephone Encounter (Signed)
error 

## 2015-02-20 IMAGING — US US ART/VEN ABD/PELV/SCROTUM DOPPLER LTD
1 series · 13 of 25 positions shown · non-contrast
Comparison: None.

CLINICAL DATA: testicular pain, L>R

EXAM:
SCROTAL ULTRASOUND
DOPPLER ULTRASOUND OF THE TESTICLES
TECHNIQUE: Complete ultrasound examination of the testicles, epididymis, and
other scrotal structures was performed. Color and spectral Doppler
ultrasound were also utilized to evaluate blood flow to the
testicles.

[Series 1: us art/ven abd/pelv/scrotum doppler ltd · 0.06mm/px · 13 of 51 slices shown]
[im 1/51]
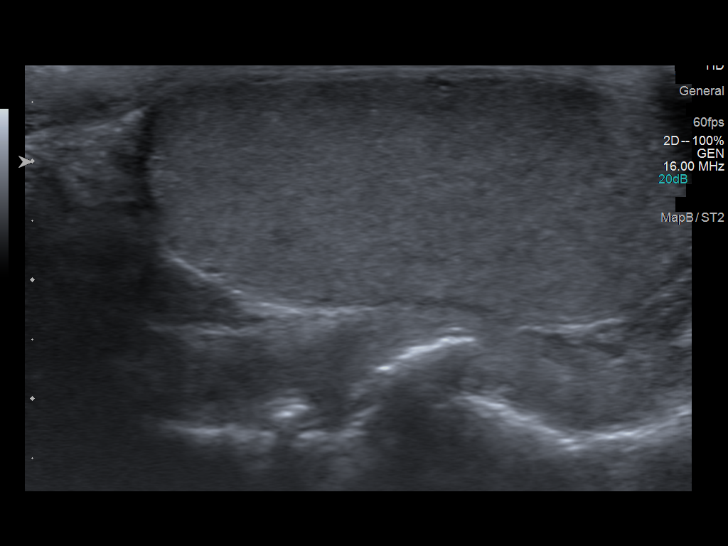
[im 5/51]
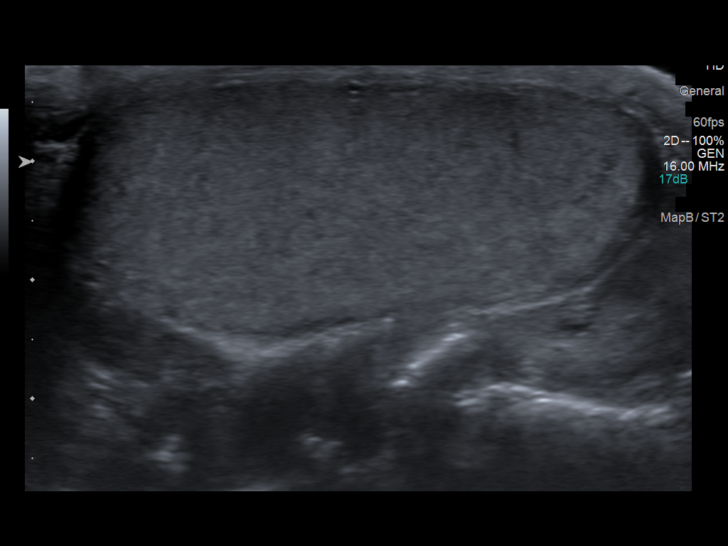
[im 9/51]
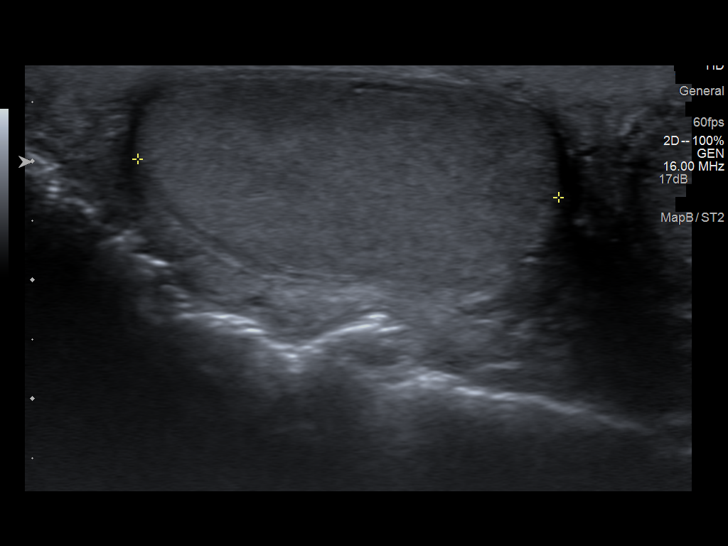
[im 13/51]
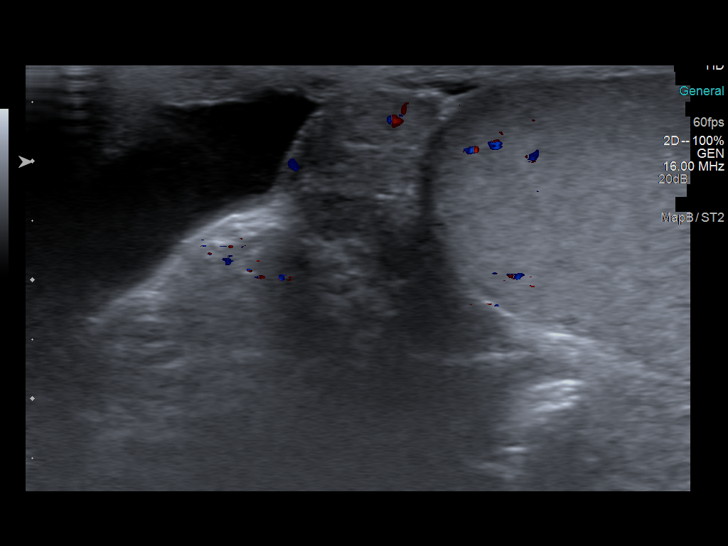
[im 17/51]
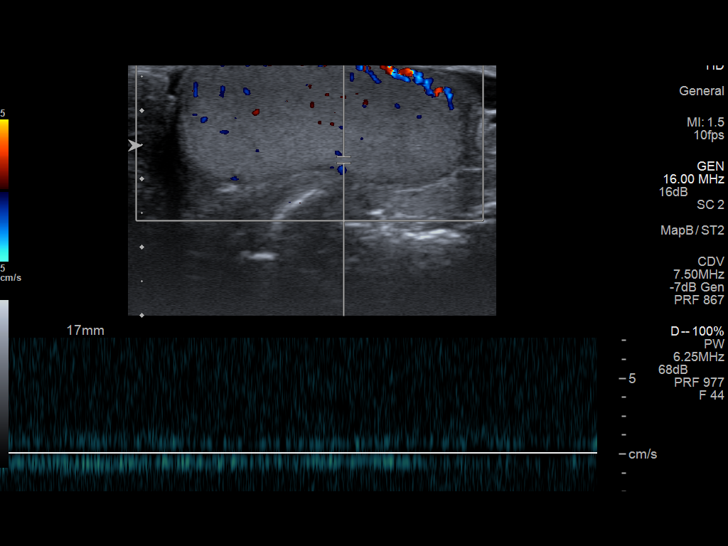
[im 21/51]
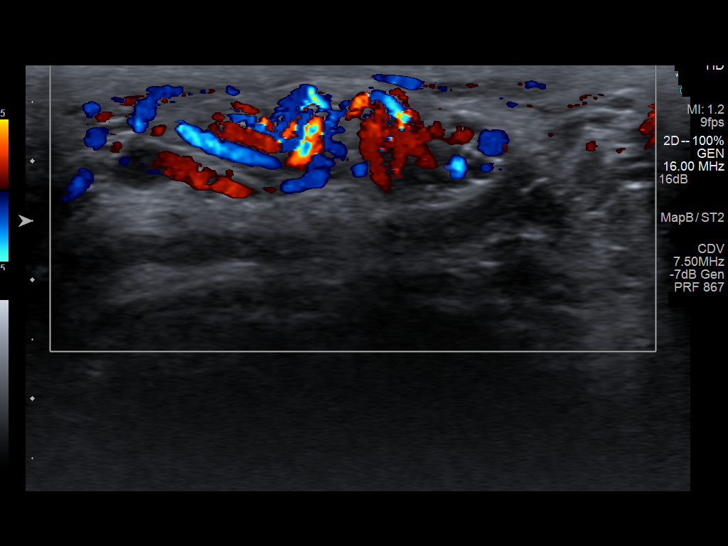
[im 26/51]
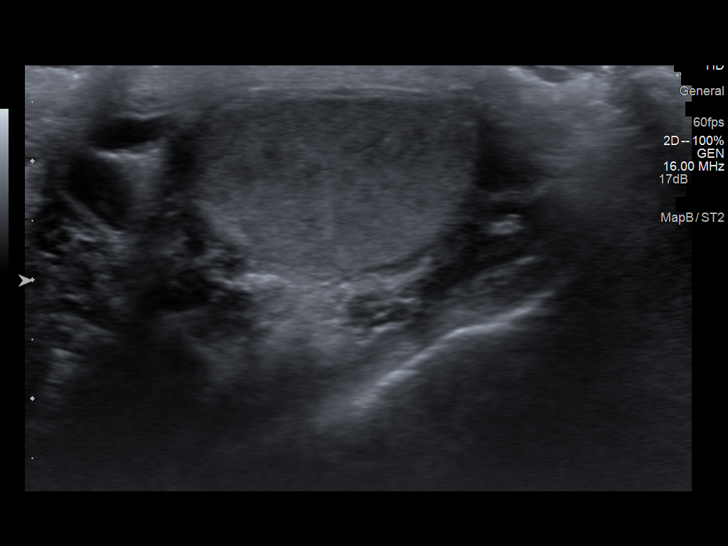
[im 30/51]
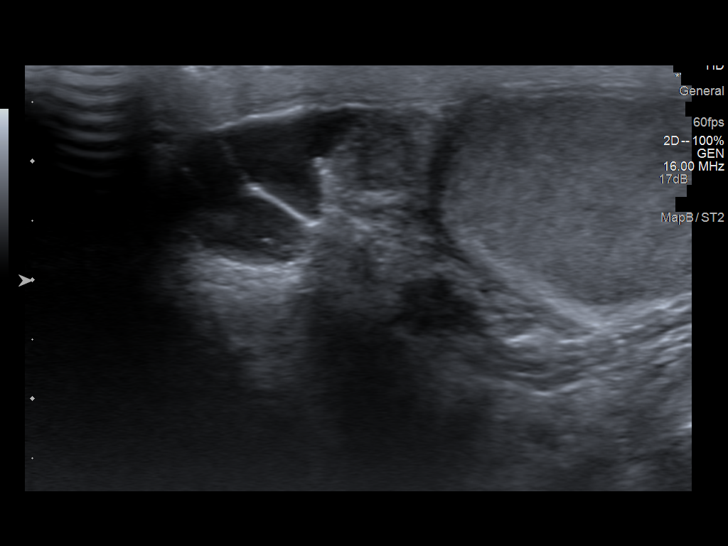
[im 34/51]
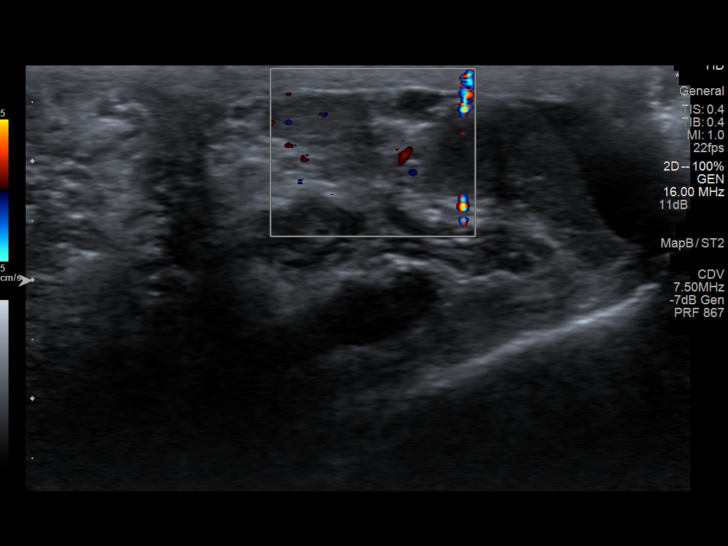
[im 38/51]
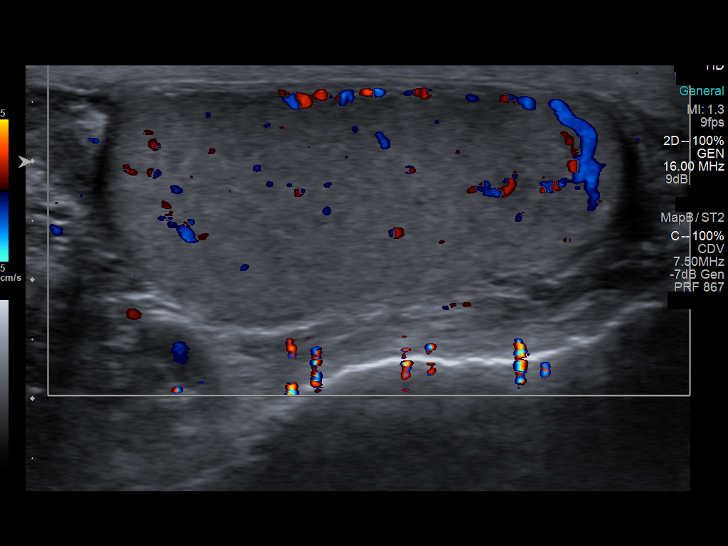
[im 42/51]
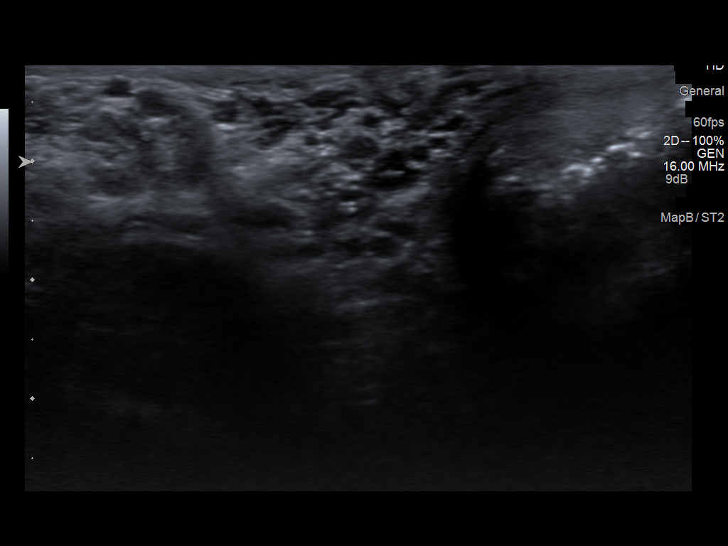
[im 46/51]
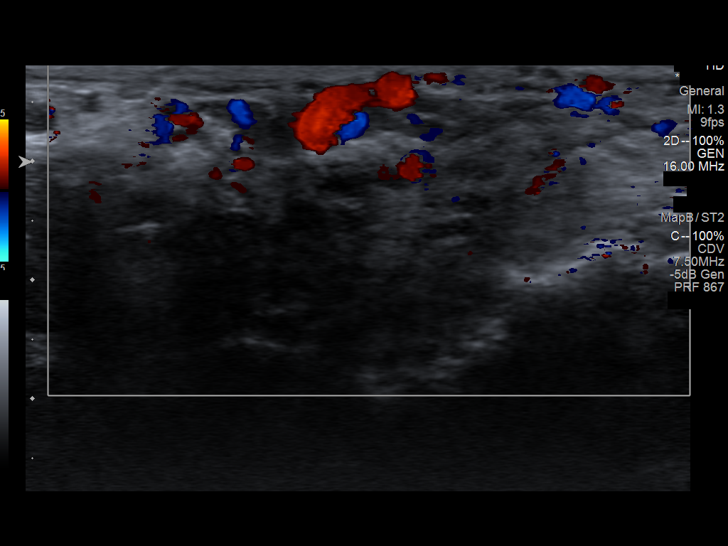
[im 51/51]
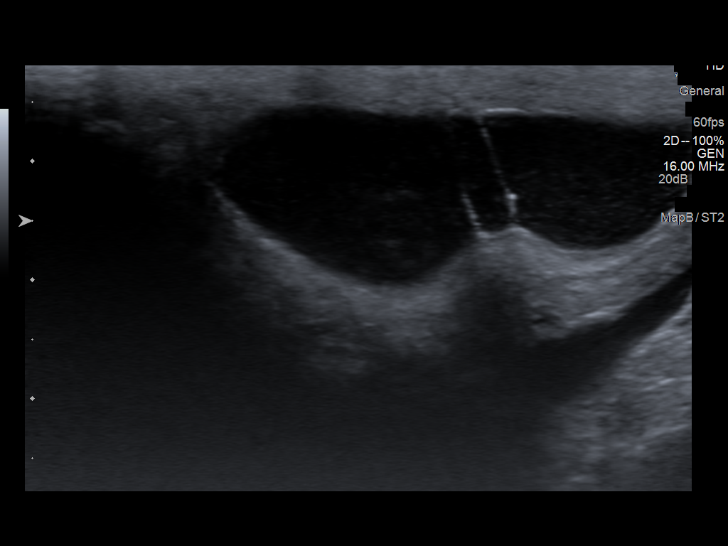

[13 of 25 positions shown; findings below may reference images not displayed]

FINDINGS: Right testicle

Measurements: 4.9 x 2 x 3.6 cm. No mass or microlithiasis
visualized.

Left testicle

Measurements: 4.3 x 1.9 x 3.3 cm. No mass or microlithiasis
visualized.

Right epididymis:  Normal in size and appearance.

Left epididymis: A 0.5 x 0.3 x 0.5 cm small cystic focus is
identified within the left epididymis. Internal echoes with a
layering appearance are appreciated, there is no appreciable
vascularity associated with this finding. Differential
considerations are is small complex cyst versus a small
spermatocele. The left epididymis is otherwise unremarkable.

Hydrocele: Bilateral hydroceles are identified small to moderate on
the right, small on the left.

Varicocele:  A varicocele is elicited on the right.

Pulsed Doppler interrogation of both testes demonstrates low
resistance arterial and venous waveforms bilaterally.
IMPRESSION: 1. Small cystic appearing focus within the left epididymis
differential considerations are a small partially complex cyst
versus a small spermatocele.
2. Bilateral hydroceles right greater than left
3. Varicocele appreciated on the right.
4. No further sonographic abnormalities.

## 2015-02-24 ENCOUNTER — Other Ambulatory Visit: Payer: Self-pay | Admitting: Internal Medicine

## 2015-03-03 IMAGING — CR DG LUMBAR SPINE COMPLETE 4+V
5 series · 5 of 5 positions shown · non-contrast
Comparison: 09/07/2011

CLINICAL DATA: Low back pain.

EXAM:
LUMBAR SPINE - COMPLETE 4+ VIEW

[view not recorded (1 of 5)]
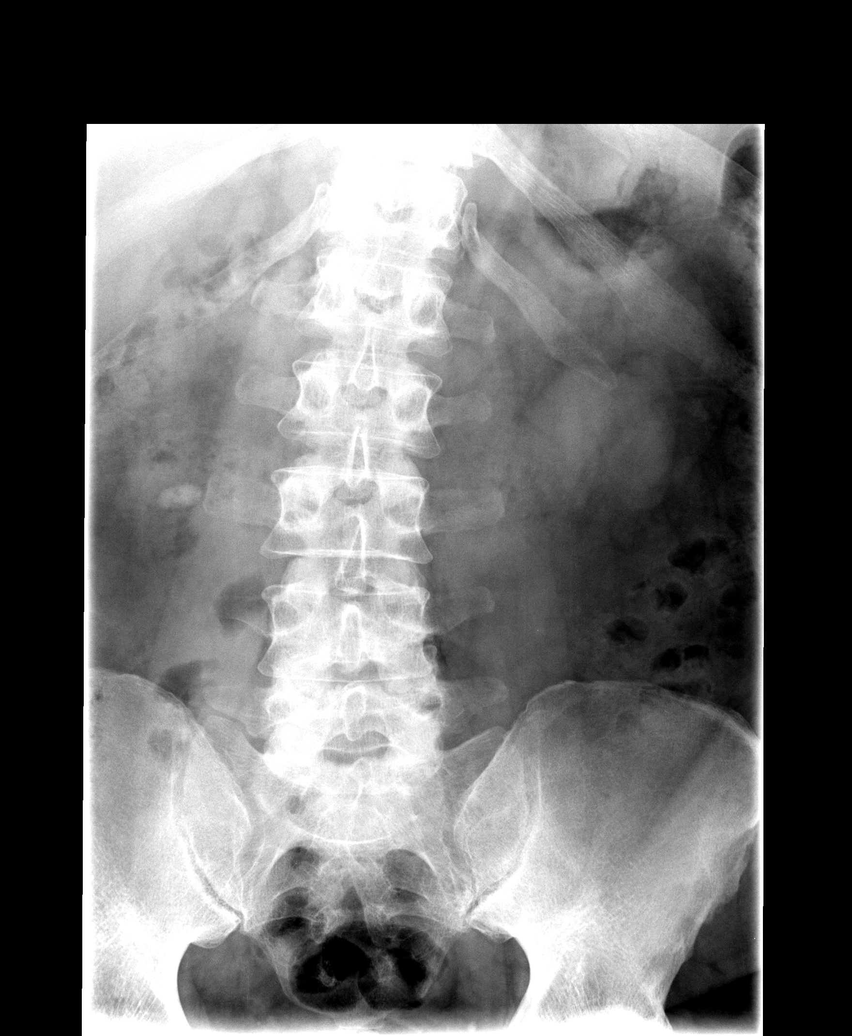

[view not recorded (2 of 5)]
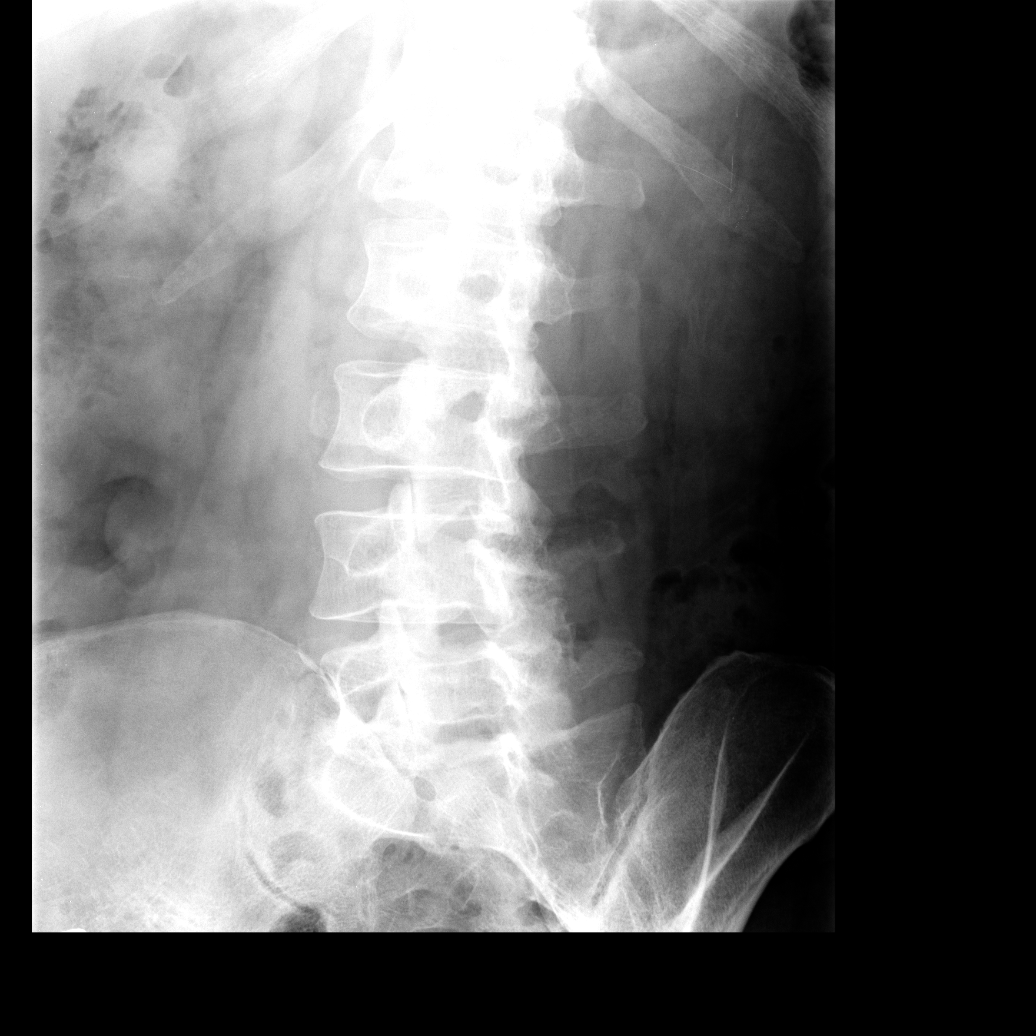

[view not recorded (3 of 5)]
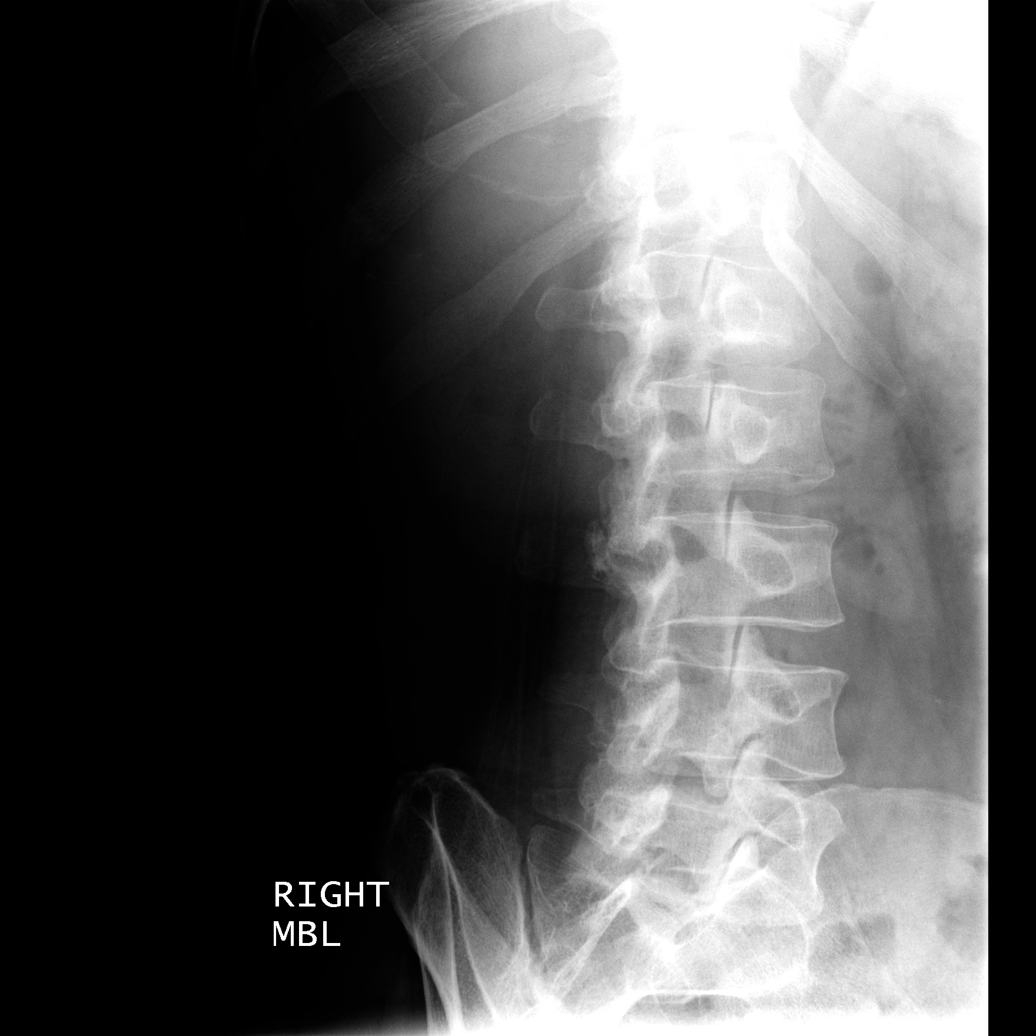

[view not recorded (4 of 5)]
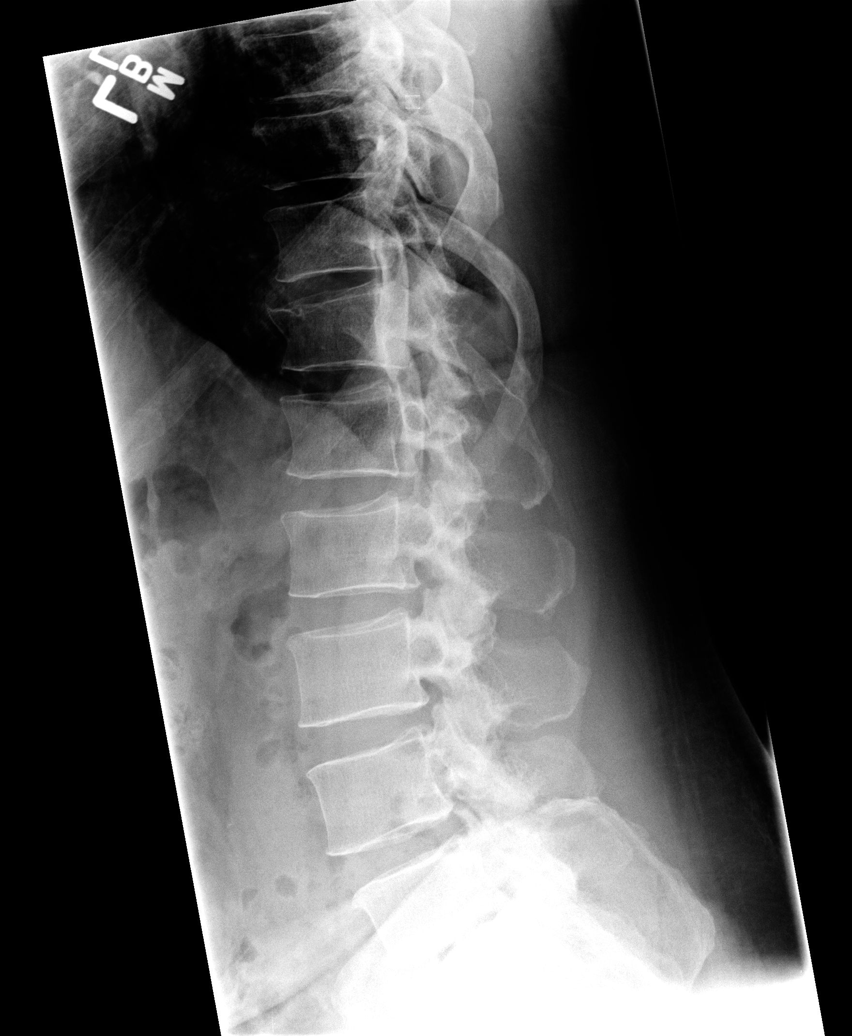

[view not recorded (5 of 5)]
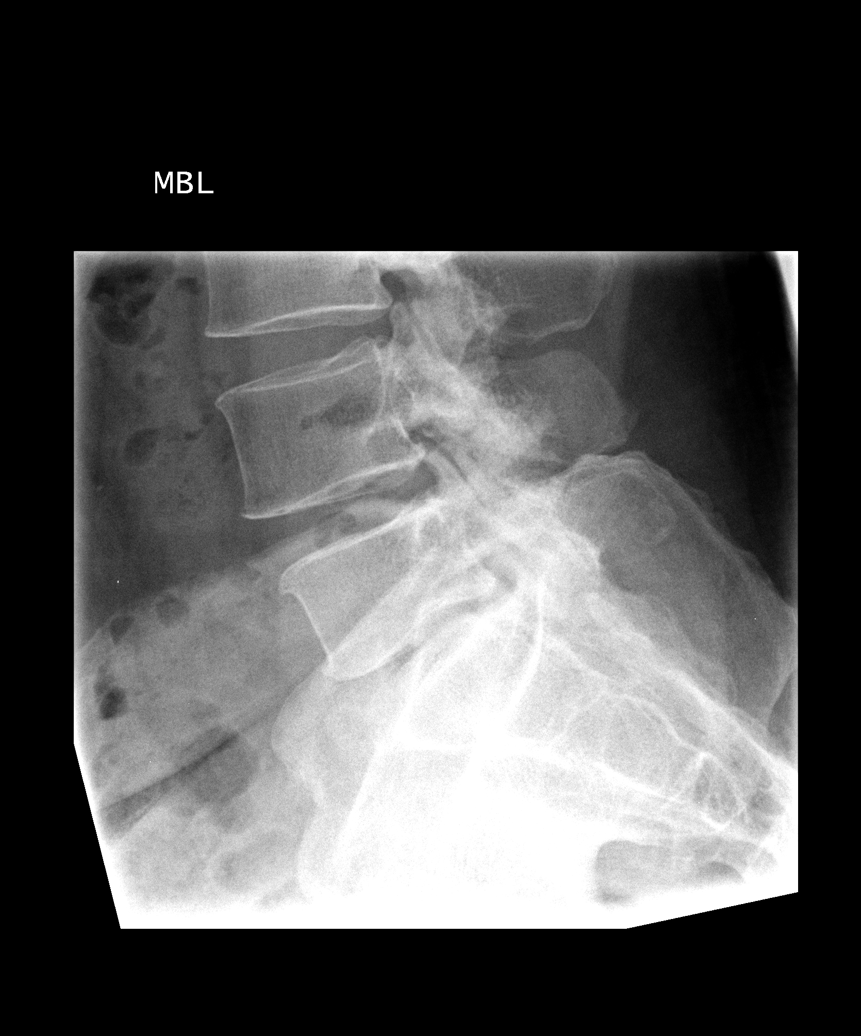

[5 of 5 positions shown; findings below may reference images not displayed]

FINDINGS: There is an old compression fracture involving the superior endplate
of T12. Alignment of the lumbar spine is normal. There is mild disc
space narrowing at L5-S1. No evidence for a pars defect. Oval-shaped
density along the right side of the L3 vertebral body on the AP view
is probably within bowel.
IMPRESSION: No acute bone abnormality.

Old T12 compression fracture.

Mild disc space narrowing at L5-S1.

## 2015-03-08 ENCOUNTER — Ambulatory Visit: Payer: BLUE CROSS/BLUE SHIELD | Attending: Internal Medicine | Admitting: Pharmacist

## 2015-03-08 DIAGNOSIS — E1165 Type 2 diabetes mellitus with hyperglycemia: Secondary | ICD-10-CM

## 2015-03-08 DIAGNOSIS — E119 Type 2 diabetes mellitus without complications: Secondary | ICD-10-CM | POA: Insufficient documentation

## 2015-03-08 DIAGNOSIS — Z79899 Other long term (current) drug therapy: Secondary | ICD-10-CM | POA: Diagnosis not present

## 2015-03-08 MED ORDER — GLUCOSE BLOOD VI STRP: ORAL_STRIP | Status: DC

## 2015-03-08 NOTE — Patient Instructions (Addendum)
Thanks for coming to see me!  Your blood sugar should be 80 - 120 when you wake up before you eat and <180 2 hours after you eat.   Call us if the strips are not covered and I will order you a new meter that will be covered by your insurance.   Come back and see me if you start seeing a lot of blood sugars in the 200s. Otherwise, follow up with Chari Manning as directed

## 2015-03-08 NOTE — Progress Notes (Signed)
S:    Patient arrives in good spirits.  Presents for diabetes follow up.  Patient reports adherence with medications. Current diabetes medications include metformin 1000 mg BID and glipizide 5 mg XL daily. Patient reports that his blood sugars have greatly improved since starting the glipizide.   Patient denies hypoglycemic events.  Patient reported dietary habits: does not follow a particular diet  Patient reported exercise habits: does not exercise   Patient denies nocturia.  Patient denies neuropathy. Patient denies visual changes. Patient reports self foot exams.    O:  Lab Results  Component Value Date   HGBA1C 9.40 01/19/2015   Patient brought log book and meter Home fasting CBG: 100s - 130s  2 hour post-prandial/random CBG: <180, with an occasional reading of 200  A/P: Diabetes currently uncontrolled based on A1c of 9.4 but under improved control based on home CBGs. Patient denies hypoglycemic events and is able to verbalize appropriate hypoglycemia management plan.  Patient reports adherence with medication. Control is suboptimal due to dietary indiscretion.  Continue metformin 1000 mg BID and glipizde XL 5 mg daily. Patient to continue monitoring blood glucose at home and will return to clinic if he sees numbers consistently in the 200s. Reviewed goal blood glucose levels, A1c, hypoglycemia, and hyperglycemia. Patient verbalized understanding.   Patient needs more strips for his [employer] Contour meter but I am not sure that this is covered under his insurance Nurse, mental health). Ordered strips and patient prefers to use this meter but patient will let us know if the strips are not covered and I will ordered a new meter and supplies that will be covered by insurance.   Next A1C anticipated February 2017.    Written patient instructions provided.  Total time in face to face counseling 20 minutes.  Follow up in Pharmacist Clinic Visit if home CBGs>200.

## 2015-03-11 ENCOUNTER — Telehealth: Payer: Self-pay | Admitting: Internal Medicine

## 2015-03-11 NOTE — Telephone Encounter (Signed)
Patient called requesting a medication refill for test strips. Please follow up.

## 2015-03-14 MED ORDER — GLUCOSE BLOOD VI STRP: ORAL_STRIP | Status: DC

## 2015-03-14 NOTE — Telephone Encounter (Signed)
Returned patient's phone call. He reported that [facility] told him that they did not receive a prescription for his test strips when I sent the electronic prescription 03/08/15. He was able to purchase a small quantity to hold him over. I will resend the prescription to [facility].

## 2015-03-21 ENCOUNTER — Telehealth: Payer: Self-pay | Admitting: Internal Medicine

## 2015-03-21 ENCOUNTER — Telehealth: Payer: Self-pay

## 2015-03-21 NOTE — Telephone Encounter (Signed)
Patient called complaining of tightness in his chest SOB and trouble breathing Instructed the patient to hang up and dial [phone] or if possible  Go straight to the ed for further evaluation

## 2015-03-21 NOTE — Telephone Encounter (Signed)
Patient called stating he was experiencing shortness of breath and chest pains. Patient was transferred to the nurse Please follow up with patient.

## 2015-08-01 ENCOUNTER — Encounter: Payer: Self-pay | Admitting: Family Medicine

## 2015-08-01 ENCOUNTER — Ambulatory Visit: Payer: BLUE CROSS/BLUE SHIELD | Attending: Family Medicine | Admitting: Family Medicine

## 2015-08-01 VITALS — BP 150/90 | HR 98 | Temp 98.4°F | Ht 72.0 in | Wt 275.8 lb

## 2015-08-01 DIAGNOSIS — G5600 Carpal tunnel syndrome, unspecified upper limb: Secondary | ICD-10-CM | POA: Insufficient documentation

## 2015-08-01 DIAGNOSIS — Z7984 Long term (current) use of oral hypoglycemic drugs: Secondary | ICD-10-CM | POA: Insufficient documentation

## 2015-08-01 DIAGNOSIS — Z885 Allergy status to narcotic agent status: Secondary | ICD-10-CM | POA: Insufficient documentation

## 2015-08-01 DIAGNOSIS — E1165 Type 2 diabetes mellitus with hyperglycemia: Secondary | ICD-10-CM | POA: Diagnosis not present

## 2015-08-01 DIAGNOSIS — M109 Gout, unspecified: Secondary | ICD-10-CM

## 2015-08-01 DIAGNOSIS — G5601 Carpal tunnel syndrome, right upper limb: Secondary | ICD-10-CM

## 2015-08-01 DIAGNOSIS — I10 Essential (primary) hypertension: Secondary | ICD-10-CM | POA: Diagnosis not present

## 2015-08-01 DIAGNOSIS — Z79899 Other long term (current) drug therapy: Secondary | ICD-10-CM | POA: Insufficient documentation

## 2015-08-01 DIAGNOSIS — R21 Rash and other nonspecific skin eruption: Secondary | ICD-10-CM

## 2015-08-01 DIAGNOSIS — M1990 Unspecified osteoarthritis, unspecified site: Secondary | ICD-10-CM | POA: Insufficient documentation

## 2015-08-01 DIAGNOSIS — E039 Hypothyroidism, unspecified: Secondary | ICD-10-CM

## 2015-08-01 LAB — POCT GLYCOSYLATED HEMOGLOBIN (HGB A1C): Hemoglobin A1C: 7.7

## 2015-08-01 LAB — GLUCOSE, POCT (MANUAL RESULT ENTRY): POC Glucose: 111 mg/dl — AB (ref 70–99)

## 2015-08-01 MED ORDER — GLIPIZIDE ER 10 MG PO TB24: 10.0000 mg | ORAL_TABLET | Freq: Every day | ORAL | Status: DC

## 2015-08-01 MED ORDER — NAPROXEN 500 MG PO TABS
500.0000 mg | ORAL_TABLET | Freq: Two times a day (BID) | ORAL | Status: DC
Start: 2015-08-01 — End: 2016-04-11

## 2015-08-01 MED ORDER — LEVOTHYROXINE SODIUM 175 MCG PO TABS: ORAL_TABLET | ORAL | Status: DC

## 2015-08-01 NOTE — Progress Notes (Signed)
Subjective:  Patient ID: Ray Garner, male    DOB: May 09, 1955  Age: 60 y.o. MRN: [id number]  CC: [place]   HPI Ray Garner is a 60 year old male with history of type 2 diabetes mellitus (A1c 7.7), hypertension, hypothyroidism, Gout who presents to establish care with me as he was previously followed by the nurse practitioner who is no longer with the practice.  He has been compliant with his medications and has adhered to a diabetic diet and exercise. He denies any recent gout flares. Blood pressure is elevated and he endorses compliance with his blood pressure medications but has not adhered to a  low-sodium diet.  He complains of right hand Cramps and shooting pain from his hand up into his forearm and he is having to wear a wrist brace to relieve symptoms which have been on for the last month. He has also noticed locking up of the left middle finger and he has to pull it with his contralateral hand to straighten up his finger. Also complains of a rash on the right side of his chest wall which started out as a dark knot which he picked at thinking it was a tick bite: with resulting increase in size to become an ulcer which hurts when he lies on his right side but pain is absent when he lies on his left side. Rash was initially pruritic but now is only painful when he applies pressure to that side.    Past Medical History  Diagnosis Date  . Hypertension   . Gout   . Arthritis   . Back pain, lumbosacral 06/30/2013  . DM type 2 (diabetes mellitus, type 2) ([place]) 06/30/2013  . HTN (hypertension) 06/30/2013  . Hypothyroid 06/30/2013   Past Surgical History  Procedure Laterality Date  . Cyst excision    . Fracture surgery      Allergies  Allergen Reactions  . Vicodin [Hydrocodone-Acetaminophen] Hives and Rash    Outpatient Prescriptions Prior to Visit  Medication Sig Dispense Refill  . allopurinol (ZYLOPRIM) 300 MG tablet Take 1 tablet (300 mg total) by mouth daily. 90 tablet 3  .  amLODipine (NORVASC) 10 MG tablet Take 1 tablet (10 mg total) by mouth daily. 90 tablet 3  . glucose blood (BAYER CONTOUR TEST) test strip Use as instructed 100 each 12  . lisinopril (PRINIVIL,ZESTRIL) 20 MG tablet Take 1 tablet (20 mg total) by mouth daily. 90 tablet 3  . metFORMIN (GLUCOPHAGE) 500 MG tablet Take 2 tablets (1,000 mg total) by mouth 2 (two) times daily with a meal. 180 tablet 3  . omeprazole (PRILOSEC) 20 MG capsule Take 1 capsule (20 mg total) by mouth daily. 30 capsule 5  . polyethylene glycol powder (GLYCOLAX/MIRALAX) powder Take 17 g by mouth daily. 850 g 1  . vitamin B-12 (CYANOCOBALAMIN) 1000 MCG tablet Take 1,000 mcg by mouth daily.    Marland Kitchen glipiZIDE (GLIPIZIDE XL) 5 MG 24 hr tablet Take 1 tablet (5 mg total) by mouth daily with breakfast. 90 tablet 1  . levothyroxine (SYNTHROID, LEVOTHROID) 175 MCG tablet TAKE ONE TABLET BY MOUTH BEFORE BREAKFAST 90 tablet 0   No facility-administered medications prior to visit.    ROS Review of Systems  Constitutional: Negative for activity change and appetite change.  HENT: Negative for sinus pressure and sore throat.   Eyes: Negative for visual disturbance.  Respiratory: Negative for cough, chest tightness and shortness of breath.   Cardiovascular: Negative for chest pain and leg swelling.  Gastrointestinal: Negative for  abdominal pain, diarrhea, constipation and abdominal distention.  Endocrine: Negative.   Genitourinary: Negative for dysuria.  Musculoskeletal: Negative for myalgias and joint swelling.  Skin: Negative for rash.  Allergic/Immunologic: Negative.   Neurological: Negative for weakness, light-headedness and numbness.  Psychiatric/Behavioral: Negative for suicidal ideas and dysphoric mood.    Objective:  BP 150/90 mmHg  Pulse 98  Temp(Src) 98.4 F (36.9 C) (Oral)  Ht 6' (1.829 m)  Wt 275 lb 12.8 oz (125.102 kg)  BMI 37.40 kg/m2  SpO2 94%  BP/Weight 08/01/2015 02/11/2015 99991111  Systolic BP Q000111Q [id number] [id number]    Diastolic BP 90 78 93  Wt. (Lbs) 275.8 273 270.4  BMI 37.4 37.02 36.66      Physical Exam  Constitutional: He is oriented to person, place, and time. He appears well-developed and well-nourished.  Cardiovascular: Normal rate, normal heart sounds and intact distal pulses.   No murmur heard. Pulmonary/Chest: Effort normal and breath sounds normal. He has no wheezes. He has no rales. He exhibits no tenderness.  Abdominal: Soft. Bowel sounds are normal. He exhibits no distension and no mass. There is no tenderness.  Musculoskeletal: He exhibits tenderness (positive Tinel sign in right wrist). He exhibits no edema.  Neurological: He is alert and oriented to person, place, and time.  Skin:  Erythematous superficial ulcer on right side of chest wall with some surrounding hyperpigmented spots. No tenderness to palpation of surrounding skin.     Lab Results  Component Value Date   HGBA1C 7.7 08/01/2015    CMP Latest Ref Rng 01/19/2015 05/20/2014 06/30/2013  Glucose 65 - 99 mg/dL 182(H) 157(H) 134(H)  BUN 7 - 25 mg/dL 11 15 10   Creatinine 0.70 - 1.33 mg/dL 0.78 1.01 0.95  Sodium 135 - 146 mmol/L 137 137 141  Potassium 3.5 - 5.3 mmol/L 4.4 4.2 4.1  Chloride 98 - 110 mmol/L 103 101 105  CO2 20 - 31 mmol/L 23 26 26   Calcium 8.6 - 10.3 mg/dL 9.0 9.3 9.5  Total Protein 6.0 - 8.3 g/dL - 7.2 7.3  Total Bilirubin 0.2 - 1.2 mg/dL - 0.5 0.5  Alkaline Phos 39 - 117 U/L - 63 66  AST 0 - 37 U/L - 31 25  ALT 0 - 53 U/L - 22 20      Assessment & Plan:   1. Type 2 diabetes mellitus with hyperglycemia, without long-term current use of insulin (HCC) A1c of 7.7 which has trended down from 9.4 seven months ago Increase glipizide to 10 mg Diabetic health Maintenance at next visit. - POCT A1C - Glucose (CBG) - Microalbumin / creatinine urine ratio; Future - glipiZIDE (GLUCOTROL XL) 10 MG 24 hr tablet; Take 1 tablet (10 mg total) by mouth daily with breakfast.  Dispense: 30 tablet; Refill: 3  2.  Gout without tophus, unspecified cause, unspecified chronicity, unspecified site No acute flare - Uric Acid; Future  3. Essential hypertension Uncontrolled Advised and low-sodium diet and lifestyle changes We'll reassess at next visit and make regimen changes this uncontrolled - Lipid panel; Future - COMPLETE METABOLIC PANEL WITH GFR; Future  4. Hypothyroidism, unspecified hypothyroidism type - TSH; Future - levothyroxine (SYNTHROID, LEVOTHROID) 175 MCG tablet; TAKE ONE TABLET BY MOUTH BEFORE BREAKFAST  Dispense: 90 tablet; Refill: 0  5. Carpal tunnel syndrome of right wrist Continue right wrist place line placed on naproxen  6. Rash and nonspecific skin eruption Unknown etiology Pain likely from applying pressure to the scab and he has been advised to lie on his contralateral  side until it heals We'll reassess at next office visit for improvement. Discussed return precautions.   Meds ordered this encounter  Medications  . glipiZIDE (GLUCOTROL XL) 10 MG 24 hr tablet    Sig: Take 1 tablet (10 mg total) by mouth daily with breakfast.    Dispense:  30 tablet    Refill:  3    Discontinue previous dose  . levothyroxine (SYNTHROID, LEVOTHROID) 175 MCG tablet    Sig: TAKE ONE TABLET BY MOUTH BEFORE BREAKFAST    Dispense:  90 tablet    Refill:  0    Follow-up: Return in about 3 weeks (around 08/22/2015) for Follow-up on rash.   Arnoldo Morale MD

## 2015-08-01 NOTE — Patient Instructions (Signed)
Diabetes Mellitus and Food It is important for you to manage your blood sugar (glucose) level. Your blood glucose level can be greatly affected by what you eat. Eating healthier foods in the appropriate amounts throughout the day at about the same time each day will help you control your blood glucose level. It can also help slow or prevent worsening of your diabetes mellitus. Healthy eating may even help you improve the level of your blood pressure and reach or maintain a healthy weight.  General recommendations for healthful eating and cooking habits include:  Eating meals and snacks regularly. Avoid going long periods of time without eating to lose weight.  Eating a diet that consists mainly of plant-based foods, such as fruits, vegetables, nuts, legumes, and whole grains.  Using low-heat cooking methods, such as baking, instead of high-heat cooking methods, such as deep frying. Work with your dietitian to make sure you understand how to use the Nutrition Facts information on food labels. HOW CAN FOOD AFFECT ME? Carbohydrates Carbohydrates affect your blood glucose level more than any other type of food. Your dietitian will help you determine how many carbohydrates to eat at each meal and teach you how to count carbohydrates. Counting carbohydrates is important to keep your blood glucose at a healthy level, especially if you are using insulin or taking certain medicines for diabetes mellitus. Alcohol Alcohol can cause sudden decreases in blood glucose (hypoglycemia), especially if you use insulin or take certain medicines for diabetes mellitus. Hypoglycemia can be a life-threatening condition. Symptoms of hypoglycemia (sleepiness, dizziness, and disorientation) are similar to symptoms of having too much alcohol.  If your health care provider has given you approval to drink alcohol, do so in moderation and use the following guidelines:  Women should not have more than one drink per day, and men  should not have more than two drinks per day. One drink is equal to:  12 oz of beer.  5 oz of wine.  1 oz of hard liquor.  Do not drink on an empty stomach.  Keep yourself hydrated. Have water, diet soda, or unsweetened iced tea.  Regular soda, juice, and other mixers might contain a lot of carbohydrates and should be counted. WHAT FOODS ARE NOT RECOMMENDED? As you make food choices, it is important to remember that all foods are not the same. Some foods have fewer nutrients per serving than other foods, even though they might have the same number of calories or carbohydrates. It is difficult to get your body what it needs when you eat foods with fewer nutrients. Examples of foods that you should avoid that are high in calories and carbohydrates but low in nutrients include:  Trans fats (most processed foods list trans fats on the Nutrition Facts label).  Regular soda.  Juice.  Candy.  Sweets, such as cake, pie, doughnuts, and cookies.  Fried foods. WHAT FOODS CAN I EAT? Eat nutrient-rich foods, which will nourish your body and keep you healthy. The food you should eat also will depend on several factors, including:  The calories you need.  The medicines you take.  Your weight.  Your blood glucose level.  Your blood pressure level.  Your cholesterol level. You should eat a variety of foods, including:  Protein.  Lean cuts of meat.  Proteins low in saturated fats, such as fish, egg whites, and beans. Avoid processed meats.  Fruits and vegetables.  Fruits and vegetables that may help control blood glucose levels, such as apples, mangoes, and   yams.  Dairy products.  Choose fat-free or low-fat dairy products, such as milk, yogurt, and cheese.  Grains, bread, pasta, and rice.  Choose whole grain products, such as multigrain bread, whole oats, and brown rice. These foods may help control blood pressure.  Fats.  Foods containing healthful fats, such as nuts,  avocado, olive oil, canola oil, and fish. DOES EVERYONE WITH DIABETES MELLITUS HAVE THE SAME MEAL PLAN? Because every person with diabetes mellitus is different, there is not one meal plan that works for everyone. It is very important that you meet with a dietitian who will help you create a meal plan that is just right for you.   This information is not intended to replace advice given to you by your health care provider. Make sure you discuss any questions you have with your health care provider.   Document Released: 11/09/2004 Document Revised: 03/05/2014 Document Reviewed: 01/09/2013 Elsevier Interactive Patient Education 2016 Elsevier Inc.  

## 2015-08-08 ENCOUNTER — Ambulatory Visit: Payer: Self-pay | Attending: Family Medicine

## 2015-08-08 DIAGNOSIS — I10 Essential (primary) hypertension: Secondary | ICD-10-CM

## 2015-08-08 DIAGNOSIS — E1165 Type 2 diabetes mellitus with hyperglycemia: Secondary | ICD-10-CM

## 2015-08-08 DIAGNOSIS — E039 Hypothyroidism, unspecified: Secondary | ICD-10-CM

## 2015-08-08 DIAGNOSIS — M109 Gout, unspecified: Secondary | ICD-10-CM

## 2015-08-08 LAB — TSH: TSH: 2.07 m[IU]/L (ref 0.40–4.50)

## 2015-08-09 ENCOUNTER — Telehealth: Payer: Self-pay

## 2015-08-09 LAB — COMPLETE METABOLIC PANEL WITH GFR
ALT: 19 U/L (ref 9–46)
AST: 18 U/L (ref 10–35)
Albumin: 4.1 g/dL (ref 3.6–5.1)
Alkaline Phosphatase: 80 U/L (ref 40–115)
BILIRUBIN TOTAL: 0.3 mg/dL (ref 0.2–1.2)
BUN: 11 mg/dL (ref 7–25)
CO2: 25 mmol/L (ref 20–31)
CREATININE: 0.83 mg/dL (ref 0.70–1.33)
Calcium: 9.1 mg/dL (ref 8.6–10.3)
Chloride: 108 mmol/L (ref 98–110)
GFR, Est Non African American: >89 mL/min (ref >=60)
GLUCOSE: 184 mg/dL — AB (ref 65–99)
Potassium: 4.1 mmol/L (ref 3.5–5.3)
SODIUM: 138 mmol/L (ref 135–146)
TOTAL PROTEIN: 7.2 g/dL (ref 6.1–8.1)

## 2015-08-09 LAB — LIPID PANEL
CHOLESTEROL: 134 mg/dL (ref 125–200)
HDL: 37 mg/dL — ABNORMAL LOW (ref >=40)
LDL Cholesterol: 63 mg/dL (ref <130)
Total CHOL/HDL Ratio: 3.6 Ratio (ref <=5.0)
Triglycerides: 168 mg/dL — ABNORMAL HIGH (ref <150)
VLDL: 34 mg/dL — ABNORMAL HIGH (ref <30)

## 2015-08-09 LAB — MICROALBUMIN / CREATININE URINE RATIO
CREATININE, URINE: 77 mg/dL (ref 20–370)
MICROALB UR: 2.2 mg/dL — AB
MICROALB/CREAT RATIO: 29 ug/mg{creat} (ref <30)

## 2015-08-09 LAB — URIC ACID: URIC ACID, SERUM: 4.7 mg/dL (ref 4.0–8.0)

## 2015-08-09 NOTE — Telephone Encounter (Signed)
Writer called patient per Dr. Jarold Song regarding his lab results which were all normal with exception of a slightly elevated triglyceride level.  Writer recommended fish oil.  Patient stated understanding and will start on fish oil.

## 2015-08-09 NOTE — Telephone Encounter (Signed)
-----   Message from Arnoldo Morale, MD sent at 08/09/2015  4:23 PM EDT ----- Labs are negative for gout, lipid panel is normal with the exception of mildly elevated triglycerides for which over-the-counter facial capsules will be helpful, thyroid is normal.

## 2015-08-24 ENCOUNTER — Ambulatory Visit: Payer: BLUE CROSS/BLUE SHIELD | Admitting: Family Medicine

## 2015-10-26 ENCOUNTER — Other Ambulatory Visit: Payer: Self-pay | Admitting: Internal Medicine

## 2015-10-26 DIAGNOSIS — M109 Gout, unspecified: Secondary | ICD-10-CM

## 2015-10-26 DIAGNOSIS — E1165 Type 2 diabetes mellitus with hyperglycemia: Secondary | ICD-10-CM

## 2015-12-23 ENCOUNTER — Telehealth: Payer: Self-pay | Admitting: Family Medicine

## 2015-12-23 DIAGNOSIS — I10 Essential (primary) hypertension: Secondary | ICD-10-CM

## 2015-12-23 DIAGNOSIS — E1165 Type 2 diabetes mellitus with hyperglycemia: Secondary | ICD-10-CM

## 2015-12-23 MED ORDER — GLIPIZIDE ER 10 MG PO TB24
10.0000 mg | ORAL_TABLET | Freq: Every day | ORAL | 0 refills | Status: DC
Start: 2015-12-23 — End: 2016-04-09

## 2015-12-23 MED ORDER — ALLOPURINOL 300 MG PO TABS: 300.0000 mg | ORAL_TABLET | Freq: Every day | ORAL | 0 refills | Status: DC

## 2015-12-23 MED ORDER — LEVOTHYROXINE SODIUM 175 MCG PO TABS: ORAL_TABLET | ORAL | 0 refills | Status: DC

## 2015-12-23 MED ORDER — AMLODIPINE BESYLATE 10 MG PO TABS: 10.0000 mg | ORAL_TABLET | Freq: Every day | ORAL | 0 refills | Status: DC

## 2015-12-23 MED ORDER — METFORMIN HCL 500 MG PO TABS: 1000.0000 mg | ORAL_TABLET | Freq: Two times a day (BID) | ORAL | 0 refills | Status: DC

## 2015-12-23 NOTE — Telephone Encounter (Signed)
Done. He will receive no more refills until he comes in for an office visit.

## 2015-12-23 NOTE — Telephone Encounter (Signed)
Will forward request to Dr. Jarold Song for approval since patient has not been seen since June.

## 2015-12-23 NOTE — Telephone Encounter (Signed)
Patient and is requesting a 90 day supply of medications, Amlodipine, glipizide, metformin, levothyroxine and allopurinol.  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

## 2016-04-09 ENCOUNTER — Other Ambulatory Visit: Payer: Self-pay | Admitting: Family Medicine

## 2016-04-09 ENCOUNTER — Telehealth: Payer: Self-pay | Admitting: Family Medicine

## 2016-04-09 ENCOUNTER — Other Ambulatory Visit: Payer: Self-pay | Admitting: Internal Medicine

## 2016-04-09 DIAGNOSIS — E1165 Type 2 diabetes mellitus with hyperglycemia: Secondary | ICD-10-CM

## 2016-04-09 DIAGNOSIS — I10 Essential (primary) hypertension: Secondary | ICD-10-CM

## 2016-04-09 MED ORDER — GLIPIZIDE ER 10 MG PO TB24: 10.0000 mg | ORAL_TABLET | Freq: Every day | ORAL | 0 refills | Status: DC

## 2016-04-09 MED ORDER — ALLOPURINOL 300 MG PO TABS: 300.0000 mg | ORAL_TABLET | Freq: Every day | ORAL | 0 refills | Status: DC

## 2016-04-09 NOTE — Telephone Encounter (Signed)
Requested medications refilled - patient must have office visit for further refills.

## 2016-04-09 NOTE — Telephone Encounter (Signed)
Pt. Is requesting refill on Allopyrinol and glipizide..... Please follow up

## 2016-04-11 ENCOUNTER — Ambulatory Visit: Payer: Self-pay | Attending: Family Medicine | Admitting: Family Medicine

## 2016-04-11 VITALS — BP 152/103 | HR 96 | Temp 97.9°F | Resp 20 | Wt 272.0 lb

## 2016-04-11 DIAGNOSIS — Z885 Allergy status to narcotic agent status: Secondary | ICD-10-CM | POA: Insufficient documentation

## 2016-04-11 DIAGNOSIS — N529 Male erectile dysfunction, unspecified: Secondary | ICD-10-CM | POA: Insufficient documentation

## 2016-04-11 DIAGNOSIS — Z1159 Encounter for screening for other viral diseases: Secondary | ICD-10-CM

## 2016-04-11 DIAGNOSIS — M1A9XX0 Chronic gout, unspecified, without tophus (tophi): Secondary | ICD-10-CM

## 2016-04-11 DIAGNOSIS — E039 Hypothyroidism, unspecified: Secondary | ICD-10-CM

## 2016-04-11 DIAGNOSIS — Z79899 Other long term (current) drug therapy: Secondary | ICD-10-CM | POA: Insufficient documentation

## 2016-04-11 DIAGNOSIS — Z7984 Long term (current) use of oral hypoglycemic drugs: Secondary | ICD-10-CM | POA: Insufficient documentation

## 2016-04-11 DIAGNOSIS — N528 Other male erectile dysfunction: Secondary | ICD-10-CM

## 2016-04-11 DIAGNOSIS — Z118 Encounter for screening for other infectious and parasitic diseases: Secondary | ICD-10-CM | POA: Insufficient documentation

## 2016-04-11 DIAGNOSIS — E1165 Type 2 diabetes mellitus with hyperglycemia: Secondary | ICD-10-CM

## 2016-04-11 DIAGNOSIS — Z23 Encounter for immunization: Secondary | ICD-10-CM

## 2016-04-11 DIAGNOSIS — Z9889 Other specified postprocedural states: Secondary | ICD-10-CM | POA: Insufficient documentation

## 2016-04-11 DIAGNOSIS — I10 Essential (primary) hypertension: Secondary | ICD-10-CM

## 2016-04-11 LAB — COMPLETE METABOLIC PANEL WITH GFR
ALBUMIN: 4.4 g/dL (ref 3.6–5.1)
ALK PHOS: 86 U/L (ref 40–115)
ALT: 25 U/L (ref 9–46)
AST: 20 U/L (ref 10–35)
BILIRUBIN TOTAL: 0.3 mg/dL (ref 0.2–1.2)
BUN: 10 mg/dL (ref 7–25)
CO2: 25 mmol/L (ref 20–31)
Calcium: 9.8 mg/dL (ref 8.6–10.3)
Chloride: 103 mmol/L (ref 98–110)
Creat: 0.89 mg/dL (ref 0.70–1.33)
GFR, Est Non African American: >89 mL/min (ref >=60)
Glucose, Bld: 115 mg/dL — ABNORMAL HIGH (ref 65–99)
POTASSIUM: 4.2 mmol/L (ref 3.5–5.3)
Sodium: 140 mmol/L (ref 135–146)
Total Protein: 7.4 g/dL (ref 6.1–8.1)

## 2016-04-11 LAB — GLUCOSE, POCT (MANUAL RESULT ENTRY): POC Glucose: 126 mg/dl — AB (ref 70–99)

## 2016-04-11 LAB — TSH: TSH: 1.34 mIU/L (ref 0.40–4.50)

## 2016-04-11 LAB — POCT GLYCOSYLATED HEMOGLOBIN (HGB A1C): Hemoglobin A1C: 7.8

## 2016-04-11 MED ORDER — GLIPIZIDE ER 10 MG PO TB24: 10.0000 mg | ORAL_TABLET | Freq: Every day | ORAL | 1 refills | Status: DC

## 2016-04-11 MED ORDER — METFORMIN HCL 500 MG PO TABS: ORAL_TABLET | ORAL | 1 refills | Status: DC

## 2016-04-11 MED ORDER — ALLOPURINOL 300 MG PO TABS: 300.0000 mg | ORAL_TABLET | Freq: Every day | ORAL | 1 refills | Status: DC

## 2016-04-11 MED ORDER — LISINOPRIL 20 MG PO TABS: 20.0000 mg | ORAL_TABLET | Freq: Every day | ORAL | 1 refills | Status: DC

## 2016-04-11 MED ORDER — LEVOTHYROXINE SODIUM 175 MCG PO TABS: ORAL_TABLET | ORAL | 1 refills | Status: DC

## 2016-04-11 MED ORDER — AMLODIPINE BESYLATE 10 MG PO TABS: 10.0000 mg | ORAL_TABLET | Freq: Every day | ORAL | 1 refills | Status: DC

## 2016-04-11 NOTE — Progress Notes (Signed)
Subjective:  Patient ID: Ray Garner, male    DOB: 1956-02-10  Age: 61 y.o. MRN: [id number]  CC: Follow-up on diabetes  HPI Ray Garner is a 61 year old male with history of type 2 diabetes mellitus (A1c 7.8), hypertension, hypothyroidism, Gout who presents for a follow-up visit.  He has been compliant with his medications and has adhered to a diabetic diet and exercise. He denies any recent gout flares. Blood pressure is elevated and he endorses compliance with his blood pressure medications but has not adhered to a  low-sodium diet. 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  He tolerates all his medications and denies any side effects.  He complains of erectile dysfunction and would like to have treatment for this.  Past Medical History:  Diagnosis Date  . Arthritis   . Back pain, lumbosacral 06/30/2013  . DM type 2 (diabetes mellitus, type 2) ([place]) 06/30/2013  . Gout   . HTN (hypertension) 06/30/2013  . Hypertension   . Hypothyroid 06/30/2013    Past Surgical History:  Procedure Laterality Date  . CYST EXCISION    . FRACTURE SURGERY      Allergies  Allergen Reactions  . Vicodin [Hydrocodone-Acetaminophen] Hives and Rash     Outpatient Medications Prior to Visit  Medication Sig Dispense Refill  . glucose blood (BAYER CONTOUR TEST) test strip Use as instructed 100 each 12  . polyethylene glycol powder (GLYCOLAX/MIRALAX) powder Take 17 g by mouth daily. 850 g 1  . vitamin B-12 (CYANOCOBALAMIN) 1000 MCG tablet Take 1,000 mcg by mouth daily.    Marland Kitchen allopurinol (ZYLOPRIM) 300 MG tablet Take 1 tablet (300 mg total) by mouth daily. 30 tablet 0  . amLODipine (NORVASC) 10 MG tablet TAKE ONE TABLET BY MOUTH ONCE DAILY 30 tablet 0  . glipiZIDE (GLUCOTROL XL) 10 MG 24 hr tablet Take 1 tablet (10 mg total) by mouth daily with breakfast. 30 tablet 0  . levothyroxine (SYNTHROID, LEVOTHROID) 175 MCG tablet TAKE ONE TABLET BY MOUTH BEFORE BREAKFAST 30 tablet 0  . lisinopril  (PRINIVIL,ZESTRIL) 20 MG tablet TAKE ONE TABLET BY MOUTH ONCE DAILY 30 tablet 0  . metFORMIN (GLUCOPHAGE) 500 MG tablet TAKE TWO TABLETS BY MOUTH TWICE DAILY WITH MEALS 120 tablet 0  . naproxen (NAPROSYN) 500 MG tablet Take 1 tablet (500 mg total) by mouth 2 (two) times daily with a meal. (Patient not taking: Reported on 04/11/2016) 60 tablet 1  . omeprazole (PRILOSEC) 20 MG capsule Take 1 capsule (20 mg total) by mouth daily. (Patient not taking: Reported on 04/11/2016) 30 capsule 5   No facility-administered medications prior to visit.     ROS Review of Systems  Constitutional: Negative for activity change and appetite change.  HENT: Negative for sinus pressure and sore throat.   Eyes: Negative for visual disturbance.  Respiratory: Negative for cough, chest tightness and shortness of breath.   Cardiovascular: Negative for chest pain and leg swelling.  Gastrointestinal: Negative for abdominal distention, abdominal pain, constipation and diarrhea.  Endocrine: Negative.   Genitourinary: Negative for dysuria.  Musculoskeletal: Negative for joint swelling and myalgias.  Skin: Negative for rash.  Allergic/Immunologic: Negative.   Neurological: Negative for weakness, light-headedness and numbness.  Psychiatric/Behavioral: Negative for dysphoric mood and suicidal ideas.    Objective:  BP (!) 152/103 (BP Location: Right Arm, Patient Position: Sitting, Cuff Size: Large)   Pulse 96   Temp 97.9 F (36.6 C) (Oral)   Resp 20   Wt  272 lb (123.4 kg)   SpO2 99%   BMI 36.89 kg/m   BP/Weight 04/11/2016 08/01/2015 0000000  Systolic BP 0000000 Q000111Q [id number]  Diastolic BP XX123456 90 78  Wt. (Lbs) 272 275.8 273  BMI 36.89 37.4 37.02     Physical Exam  Constitutional: He is oriented to person, place, and time. He appears well-developed and well-nourished.  Cardiovascular: Normal rate, normal heart sounds and intact distal pulses.   No murmur heard. Pulmonary/Chest: Effort normal and breath sounds normal.  He has no wheezes. He has no rales. He exhibits no tenderness.  Abdominal: Soft. Bowel sounds are normal. He exhibits no distension and no mass. There is no tenderness.  Musculoskeletal: Normal range of motion.  Neurological: He is alert and oriented to person, place, and time.  Skin: Skin is warm and dry.  Psychiatric: He has a normal mood and affect.     Lab Results  Component Value Date   HGBA1C 7.8 04/11/2016   Lipid Panel     Component Value Date/Time   CHOL 134 08/08/2015 0845   TRIG 168 (H) 08/08/2015 0845   HDL 37 (L) 08/08/2015 0845   CHOLHDL 3.6 08/08/2015 0845   VLDL 34 (H) 08/08/2015 0845   LDLCALC 63 08/08/2015 0845     Assessment & Plan:   1. Type 2 diabetes mellitus with hyperglycemia, without long-term current use of insulin (HCC) A1c of 7.8 which is above goal of less than 7 Increased dose of metformin from total maximum daily dose of 2000mg  to 2500mg  He will need diabetic shoes due to hammertoes which predispose him to a risk of diabetic foot ulcers however he has no medical coverage for this. - Glucose (CBG) - HgB A1c - glipiZIDE (GLUCOTROL XL) 10 MG 24 hr tablet; Take 1 tablet (10 mg total) by mouth daily with breakfast.  Dispense: 90 tablet; Refill: 1 - metFORMIN (GLUCOPHAGE) 500 MG tablet; Take 3 tabs (1500mg ) in the morning and 2 tabs (1000mg ) in the evening.  Dispense: 450 tablet; Refill: 1  2. Essential hypertension Above goal of less than 130/80 Lifestyle modifications have been encouraged including weight loss, exercise Regimen change if blood pressure is still elevated at his next visit - amLODipine (NORVASC) 10 MG tablet; Take 1 tablet (10 mg total) by mouth daily.  Dispense: 90 tablet; Refill: 1 - lisinopril (PRINIVIL,ZESTRIL) 20 MG tablet; Take 1 tablet (20 mg total) by mouth daily.  Dispense: 90 tablet; Refill: 1 - COMPLETE METABOLIC PANEL WITH GFR  3. Need for hepatitis C screening test - Hepatitis C antibody, reflex  4. Hypothyroidism,  unspecified type Stable - levothyroxine (SYNTHROID, LEVOTHROID) 175 MCG tablet; TAKE ONE TABLET BY MOUTH BEFORE BREAKFAST  Dispense: 90 tablet; Refill: 1 - TSH  5. Chronic gout without tophus, unspecified cause, unspecified site No acute flares - allopurinol (ZYLOPRIM) 300 MG tablet; Take 1 tablet (300 mg total) by mouth daily.  Dispense: 90 tablet; Refill: 1 - Uric Acid  6. Erectile dysfunction Manual prescription written for sildenafil 20 mg, 2-5 tabs PO for ED. At least 24 hours between doses.  Meds ordered this encounter  Medications  . allopurinol (ZYLOPRIM) 300 MG tablet    Sig: Take 1 tablet (300 mg total) by mouth daily.    Dispense:  90 tablet    Refill:  1  . glipiZIDE (GLUCOTROL XL) 10 MG 24 hr tablet    Sig: Take 1 tablet (10 mg total) by mouth daily with breakfast.    Dispense:  90 tablet  Refill:  1  . metFORMIN (GLUCOPHAGE) 500 MG tablet    Sig: Take 3 tabs (1500mg ) in the morning and 2 tabs (1000mg ) in the evening.    Dispense:  450 tablet    Refill:  1    Discontinue previous dose  . amLODipine (NORVASC) 10 MG tablet    Sig: Take 1 tablet (10 mg total) by mouth daily.    Dispense:  90 tablet    Refill:  1  . lisinopril (PRINIVIL,ZESTRIL) 20 MG tablet    Sig: Take 1 tablet (20 mg total) by mouth daily.    Dispense:  90 tablet    Refill:  1  . levothyroxine (SYNTHROID, LEVOTHROID) 175 MCG tablet    Sig: TAKE ONE TABLET BY MOUTH BEFORE BREAKFAST    Dispense:  90 tablet    Refill:  1    Follow-up: Return in about 3 months (around 07/09/2016) for For follow-up of chronic medical conditions.   Arnoldo Morale MD

## 2016-04-12 ENCOUNTER — Encounter: Payer: Self-pay | Admitting: Family Medicine

## 2016-04-12 LAB — HEPATITIS C ANTIBODY: HCV Ab: NEGATIVE

## 2016-04-12 LAB — URIC ACID: URIC ACID, SERUM: 5.9 mg/dL (ref 4.0–8.0)

## 2016-04-17 ENCOUNTER — Telehealth: Payer: Self-pay | Admitting: *Deleted

## 2016-04-17 NOTE — Telephone Encounter (Signed)
Patient verified DOB Patient is aware of labs being normal. Patient expressed his understanding and had no further questions at this time.

## 2016-04-17 NOTE — Telephone Encounter (Signed)
-----   Message from Arnoldo Morale, MD sent at 04/12/2016  1:07 PM EST ----- Please inform the patient that labs are normal. Thank you.

## 2016-11-01 ENCOUNTER — Encounter (HOSPITAL_COMMUNITY): Payer: Self-pay | Admitting: Emergency Medicine

## 2016-11-01 ENCOUNTER — Ambulatory Visit (HOSPITAL_COMMUNITY)
Admission: EM | Admit: 2016-11-01 | Discharge: 2016-11-01 | Disposition: A | Discharge status: Home / Self Care | Payer: Self-pay | Attending: Internal Medicine | Admitting: Internal Medicine

## 2016-11-01 DIAGNOSIS — J0100 Acute maxillary sinusitis, unspecified: Secondary | ICD-10-CM

## 2016-11-01 MED ORDER — BENZONATATE 100 MG PO CAPS: 100.0000 mg | ORAL_CAPSULE | Freq: Three times a day (TID) | ORAL | 0 refills | Status: DC

## 2016-11-01 MED ORDER — AMOXICILLIN-POT CLAVULANATE 875-125 MG PO TABS: 1.0000 | ORAL_TABLET | Freq: Two times a day (BID) | ORAL | 0 refills | Status: DC

## 2016-11-01 NOTE — Discharge Instructions (Signed)
You have acute sinusitis. I have prescribed Augmentin to treat your infection. Take 1 tablet twice a day for 7 days. You may take Tylenol every 4 hours for fever, pain, or headache, not to exceed 4,000 mg a day, or you may take Ibuprofen every 6-8 hours, not to exceed 1600 mg a day. For congestion, you may use Flonase nasal spray 2 sprays, each nostril once a day. I also recommend Mucinex, or Mucinex DM twice daily with a full glass of water.

## 2016-11-01 NOTE — ED Triage Notes (Signed)
Onset of symptoms 2 weeks ago.  Initially had a slight cough.  As of Friday started with cough and chest congestion.  Says he cannot seem to get phlegm out of chest

## 2016-11-01 NOTE — ED Provider Notes (Signed)
  [place]   [id number] 11/01/16 Arrival Time: 3382   SUBJECTIVE:  Ray Garner is a 61 y.o. male who presents to the urgent care with complaint of sinus pain, pressure, runny nose, congestion, and cough for 2 weeks. Also low-grade fevers for 1 week. No nausea, no vomiting, cough is dry, hacking, nonproductive. 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     Past Medical History:  Diagnosis Date  . Arthritis   . Back pain, lumbosacral 06/30/2013  . DM type 2 (diabetes mellitus, type 2) ([place]) 06/30/2013  . Gout   . HTN (hypertension) 06/30/2013  . Hypertension   . Hypothyroid 06/30/2013   No family history on file. Social History   Social History  . Marital status: Divorced    Spouse name: N/A  . Number of children: N/A  . Years of education: N/A   Occupational History  . Not on file.   Social History Main Topics  . Smoking status: Former Smoker    Quit date: 05/14/1983  . Smokeless tobacco: Former Systems developer  . Alcohol use Yes  . Drug use: No  . Sexual activity: Not on file   Other Topics Concern  . Not on file   Social History Narrative  . No narrative on file   No outpatient prescriptions have been marked as taking for the 11/01/16 encounter [facility] Encounter).   Allergies  Allergen Reactions  . Vicodin [Hydrocodone-Acetaminophen] Hives and Rash      ROS: As per HPI, remainder of ROS negative.   OBJECTIVE:   Vitals:   11/01/16 1108  BP: (!) 140/98  Pulse: 85  Resp: 18  Temp: 99.2 F (37.3 C)  TempSrc: Oral  SpO2: 96%     General appearance: alert; no distress Eyes: PERRL; EOMI; conjunctiva normal HENT: normocephalic; atraumatic; TMs normal, canal normal, external ears normal without trauma; nasal mucosa normal;There is maxillary sinus tenderness oral mucosa normal Neck: supple, no cervical lymphadenopathy Lungs: clear to auscultation bilaterally Heart: regular rate and rhythm Abdomen: soft, non-tender; bowel sounds normal; no  masses or organomegaly; no guarding or rebound tenderness Back: no CVA tenderness Extremities: no cyanosis or edema; symmetrical with no gross deformities Skin: warm and dry Neurologic: normal gait; grossly normal Psychological: alert and cooperative; normal mood and affect      Labs:  Labs Reviewed - No data to display  No results found.     ASSESSMENT & PLAN:  1. Acute non-recurrent maxillary sinusitis     Meds ordered this encounter  Medications  . amoxicillin-clavulanate (AUGMENTIN) 875-125 MG tablet    Sig: Take 1 tablet by mouth 2 (two) times daily.    Dispense:  14 tablet    Refill:  0    Order Specific Question:   Supervising Provider    Answer:   Sherlene Shams [[id number]]  . benzonatate (TESSALON) 100 MG capsule    Sig: Take 1 capsule (100 mg total) by mouth every 8 (eight) hours.    Dispense:  21 capsule    Refill:  0    Order Specific Question:   Supervising Provider    Answer:   Sherlene Shams [[id number]]    Reviewed expectations re: course of current medical issues. Questions answered. Outlined signs and symptoms indicating need for more acute intervention. Patient verbalized understanding. After Visit Summary given.    Procedures:        Barnet Glasgow, NP 11/01/16 1605

## 2016-12-04 ENCOUNTER — Other Ambulatory Visit: Payer: Self-pay | Admitting: Family Medicine

## 2016-12-04 DIAGNOSIS — I10 Essential (primary) hypertension: Secondary | ICD-10-CM

## 2016-12-04 DIAGNOSIS — E039 Hypothyroidism, unspecified: Secondary | ICD-10-CM

## 2016-12-05 NOTE — Telephone Encounter (Signed)
Will forward to PCP 

## 2016-12-05 NOTE — Telephone Encounter (Signed)
Pt called to request a refill for all his medication to please sent it to the [facility] [place] - [address] Please if is not auth please call him back

## 2016-12-14 ENCOUNTER — Other Ambulatory Visit: Payer: Self-pay | Admitting: Family Medicine

## 2016-12-14 DIAGNOSIS — E039 Hypothyroidism, unspecified: Secondary | ICD-10-CM

## 2016-12-14 DIAGNOSIS — I10 Essential (primary) hypertension: Secondary | ICD-10-CM

## 2016-12-14 DIAGNOSIS — M1A9XX0 Chronic gout, unspecified, without tophus (tophi): Secondary | ICD-10-CM

## 2016-12-14 DIAGNOSIS — E1165 Type 2 diabetes mellitus with hyperglycemia: Secondary | ICD-10-CM

## 2016-12-14 MED ORDER — ALLOPURINOL 300 MG PO TABS: 300.0000 mg | ORAL_TABLET | Freq: Every day | ORAL | 0 refills | Status: DC

## 2016-12-14 MED ORDER — METFORMIN HCL 500 MG PO TABS: ORAL_TABLET | ORAL | 0 refills | Status: DC

## 2016-12-14 MED ORDER — GLIPIZIDE ER 10 MG PO TB24: 10.0000 mg | ORAL_TABLET | Freq: Every day | ORAL | 0 refills | Status: DC

## 2016-12-14 MED ORDER — AMLODIPINE BESYLATE 10 MG PO TABS: 10.0000 mg | ORAL_TABLET | Freq: Every day | ORAL | 0 refills | Status: DC

## 2016-12-14 MED ORDER — LISINOPRIL 20 MG PO TABS: 20.0000 mg | ORAL_TABLET | Freq: Every day | ORAL | 0 refills | Status: DC

## 2016-12-14 MED ORDER — LEVOTHYROXINE SODIUM 175 MCG PO TABS: ORAL_TABLET | ORAL | 0 refills | Status: DC

## 2016-12-14 NOTE — Addendum Note (Signed)
Addended by: Rica Mast on: 12/14/2016 11:07 AM   Modules accepted: Orders

## 2016-12-14 NOTE — Telephone Encounter (Signed)
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

## 2016-12-14 NOTE — Telephone Encounter (Signed)
Patient has not been seen since Feb 2018 but has appt with Dr. Jarold Song 12/17/16. Will refill x 7 days to last until appt.

## 2016-12-17 ENCOUNTER — Encounter: Payer: Self-pay | Admitting: Family Medicine

## 2016-12-17 ENCOUNTER — Ambulatory Visit: Payer: Self-pay | Attending: Family Medicine | Admitting: Family Medicine

## 2016-12-17 VITALS — BP 144/95 | HR 97 | Temp 98.2°F | Ht 72.0 in | Wt 264.6 lb

## 2016-12-17 DIAGNOSIS — M1A9XX0 Chronic gout, unspecified, without tophus (tophi): Secondary | ICD-10-CM | POA: Insufficient documentation

## 2016-12-17 DIAGNOSIS — Z885 Allergy status to narcotic agent status: Secondary | ICD-10-CM | POA: Insufficient documentation

## 2016-12-17 DIAGNOSIS — E039 Hypothyroidism, unspecified: Secondary | ICD-10-CM | POA: Insufficient documentation

## 2016-12-17 DIAGNOSIS — Z794 Long term (current) use of insulin: Secondary | ICD-10-CM | POA: Insufficient documentation

## 2016-12-17 DIAGNOSIS — Z9889 Other specified postprocedural states: Secondary | ICD-10-CM | POA: Insufficient documentation

## 2016-12-17 DIAGNOSIS — Z79899 Other long term (current) drug therapy: Secondary | ICD-10-CM | POA: Insufficient documentation

## 2016-12-17 DIAGNOSIS — E1165 Type 2 diabetes mellitus with hyperglycemia: Secondary | ICD-10-CM | POA: Insufficient documentation

## 2016-12-17 DIAGNOSIS — I10 Essential (primary) hypertension: Secondary | ICD-10-CM | POA: Insufficient documentation

## 2016-12-17 LAB — POCT GLYCOSYLATED HEMOGLOBIN (HGB A1C): HEMOGLOBIN A1C: 12.2

## 2016-12-17 LAB — GLUCOSE, POCT (MANUAL RESULT ENTRY)
POC Glucose: 427 mg/dl — AB (ref 70–99)
POC Glucose: 346 mg/dl — AB (ref 70–99)

## 2016-12-17 MED ORDER — ALLOPURINOL 300 MG PO TABS: 300.0000 mg | ORAL_TABLET | Freq: Every day | ORAL | 1 refills | Status: DC

## 2016-12-17 MED ORDER — LISINOPRIL 20 MG PO TABS: 20.0000 mg | ORAL_TABLET | Freq: Every day | ORAL | 1 refills | Status: DC

## 2016-12-17 MED ORDER — GLIPIZIDE ER 10 MG PO TB24: 20.0000 mg | ORAL_TABLET | Freq: Every day | ORAL | 1 refills | Status: DC

## 2016-12-17 MED ORDER — INSULIN ASPART 100 UNIT/ML ~~LOC~~ SOLN
20.0000 [IU] | Freq: Once | SUBCUTANEOUS | Status: AC
  Administered 2016-12-17: 20 [IU] via SUBCUTANEOUS

## 2016-12-17 MED ORDER — AMLODIPINE BESYLATE 10 MG PO TABS: 10.0000 mg | ORAL_TABLET | Freq: Every day | ORAL | 1 refills | Status: DC

## 2016-12-17 MED ORDER — METFORMIN HCL 500 MG PO TABS: ORAL_TABLET | ORAL | 1 refills | Status: DC

## 2016-12-17 MED ORDER — LEVOTHYROXINE SODIUM 175 MCG PO TABS: ORAL_TABLET | ORAL | 1 refills | Status: DC

## 2016-12-17 NOTE — Patient Instructions (Signed)
Diabetes Mellitus and Food It is important for you to manage your blood sugar (glucose) level. Your blood glucose level can be greatly affected by what you eat. Eating healthier foods in the appropriate amounts throughout the day at about the same time each day will help you control your blood glucose level. It can also help slow or prevent worsening of your diabetes mellitus. Healthy eating may even help you improve the level of your blood pressure and reach or maintain a healthy weight. General recommendations for healthful eating and cooking habits include:  Eating meals and snacks regularly. Avoid going long periods of time without eating to lose weight.  Eating a diet that consists mainly of plant-based foods, such as fruits, vegetables, nuts, legumes, and whole grains.  Using low-heat cooking methods, such as baking, instead of high-heat cooking methods, such as deep frying.  Work with your dietitian to make sure you understand how to use the Nutrition Facts information on food labels. How can food affect me? Carbohydrates Carbohydrates affect your blood glucose level more than any other type of food. Your dietitian will help you determine how many carbohydrates to eat at each meal and teach you how to count carbohydrates. Counting carbohydrates is important to keep your blood glucose at a healthy level, especially if you are using insulin or taking certain medicines for diabetes mellitus. Alcohol Alcohol can cause sudden decreases in blood glucose (hypoglycemia), especially if you use insulin or take certain medicines for diabetes mellitus. Hypoglycemia can be a life-threatening condition. Symptoms of hypoglycemia (sleepiness, dizziness, and disorientation) are similar to symptoms of having too much alcohol. If your health care provider has given you approval to drink alcohol, do so in moderation and use the following guidelines:  Women should not have more than one drink per day, and men  should not have more than two drinks per day. One drink is equal to: ? 12 oz of beer. ? 5 oz of wine. ? 1 oz of hard liquor.  Do not drink on an empty stomach.  Keep yourself hydrated. Have water, diet soda, or unsweetened iced tea.  Regular soda, juice, and other mixers might contain a lot of carbohydrates and should be counted.  What foods are not recommended? As you make food choices, it is important to remember that all foods are not the same. Some foods have fewer nutrients per serving than other foods, even though they might have the same number of calories or carbohydrates. It is difficult to get your body what it needs when you eat foods with fewer nutrients. Examples of foods that you should avoid that are high in calories and carbohydrates but low in nutrients include:  Trans fats (most processed foods list trans fats on the Nutrition Facts label).  Regular soda.  Juice.  Candy.  Sweets, such as cake, pie, doughnuts, and cookies.  Fried foods.  What foods can I eat? Eat nutrient-rich foods, which will nourish your body and keep you healthy. The food you should eat also will depend on several factors, including:  The calories you need.  The medicines you take.  Your weight.  Your blood glucose level.  Your blood pressure level.  Your cholesterol level.  You should eat a variety of foods, including:  Protein. ? Lean cuts of meat. ? Proteins low in saturated fats, such as fish, egg whites, and beans. Avoid processed meats.  Fruits and vegetables. ? Fruits and vegetables that may help control blood glucose levels, such as apples,   mangoes, and yams.  Dairy products. ? Choose fat-free or low-fat dairy products, such as milk, yogurt, and cheese.  Grains, bread, pasta, and rice. ? Choose whole grain products, such as multigrain bread, whole oats, and brown rice. These foods may help control blood pressure.  Fats. ? Foods containing healthful fats, such as  nuts, avocado, olive oil, canola oil, and fish.  Does everyone with diabetes mellitus have the same meal plan? Because every person with diabetes mellitus is different, there is not one meal plan that works for everyone. It is very important that you meet with a dietitian who will help you create a meal plan that is just right for you. This information is not intended to replace advice given to you by your health care provider. Make sure you discuss any questions you have with your health care provider. Document Released: 11/09/2004 Document Revised: 07/21/2015 Document Reviewed: 01/09/2013 Elsevier Interactive Patient Education  2017 Elsevier Inc.  

## 2016-12-17 NOTE — Progress Notes (Signed)
Pt has been out of all medication for 3 weeks.

## 2016-12-17 NOTE — Progress Notes (Signed)
Subjective:  Patient ID: Ray Garner, male    DOB: [birth year]  Age: 61 y.o. MRN: [id number]  CC: Diabetes and Medication Refill   HPI Ray Garner is a 61 year old male with history of type 2 diabetes mellitus (A1c 12.2 up from 7.8), hypertension, hypothyroidism, Gout who presents for a follow-up visit.  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 His blood sugar in the clinic is 427 and he had a meal 1 hour ago; NovoLog 20 units administered and patient observed for 30 minutes prior to repeating CBG.  His blood pressure is also elevated.  He denies any recent gout flares.  With regards to compliance with diet, he admits he has not been adhering to a diabetic diet or exercising. Denies chest pains or shortness of breath.  Past Medical History:  Diagnosis Date  . Arthritis   . Back pain, lumbosacral 06/30/2013  . DM type 2 (diabetes mellitus, type 2) ([place]) 06/30/2013  . Gout   . HTN (hypertension) 06/30/2013  . Hypertension   . Hypothyroid 06/30/2013    Past Surgical History:  Procedure Laterality Date  . CYST EXCISION    . FRACTURE SURGERY      Allergies  Allergen Reactions  . Vicodin [Hydrocodone-Acetaminophen] Hives and Rash     Outpatient Medications Prior to Visit  Medication Sig Dispense Refill  . glucose blood (BAYER CONTOUR TEST) test strip Use as instructed (Patient not taking: Reported on 12/17/2016) 100 each 12  . vitamin B-12 (CYANOCOBALAMIN) 1000 MCG tablet Take 1,000 mcg by mouth daily.    Marland Kitchen allopurinol (ZYLOPRIM) 300 MG tablet Take 1 tablet (300 mg total) by mouth daily. (Patient not taking: Reported on 12/17/2016) 7 tablet 0  . amLODipine (NORVASC) 10 MG tablet Take 1 tablet (10 mg total) by mouth daily. (Patient not taking: Reported on 12/17/2016) 7 tablet 0  . amoxicillin-clavulanate (AUGMENTIN) 875-125 MG tablet Take 1 tablet by mouth 2 (two) times daily. (Patient not taking:  Reported on 12/17/2016) 14 tablet 0  . benzonatate (TESSALON) 100 MG capsule Take 1 capsule (100 mg total) by mouth every 8 (eight) hours. (Patient not taking: Reported on 12/17/2016) 21 capsule 0  . glipiZIDE (GLUCOTROL XL) 10 MG 24 hr tablet Take 1 tablet (10 mg total) by mouth daily with breakfast. (Patient not taking: Reported on 12/17/2016) 7 tablet 0  . levothyroxine (SYNTHROID, LEVOTHROID) 175 MCG tablet TAKE 1 TABLET BY MOUTH ONCE DAILY BEFORE BREAKFAST (Patient not taking: Reported on 12/17/2016) 7 tablet 0  . lisinopril (PRINIVIL,ZESTRIL) 20 MG tablet Take 1 tablet (20 mg total) by mouth daily. (Patient not taking: Reported on 12/17/2016) 7 tablet 0  . metFORMIN (GLUCOPHAGE) 500 MG tablet Take 3 tabs (1546m) in the morning and 2 tabs (10065m in the evening. (Patient not taking: Reported on 12/17/2016) 35 tablet 0  . polyethylene glycol powder (GLYCOLAX/MIRALAX) powder Take 17 g by mouth daily. (Patient not taking: Reported on 12/17/2016) 850 g 1   No facility-administered medications prior to visit.     ROS Review of Systems  Constitutional: Negative for activity change and appetite change.  HENT: Negative for sinus pressure and sore throat.   Eyes: Negative for visual disturbance.  Respiratory: Negative for cough, chest tightness and shortness of breath.   Cardiovascular: Negative for chest pain and leg swelling.  Gastrointestinal: Negative for abdominal distention, abdominal pain, constipation and diarrhea.  Endocrine: Negative.  Genitourinary: Negative for dysuria.  Musculoskeletal: Negative for joint swelling and myalgias.  Skin: Negative for rash.  Allergic/Immunologic: Negative.   Neurological: Negative for weakness, light-headedness and numbness.  Psychiatric/Behavioral: Negative for dysphoric mood and suicidal ideas.    Objective:  BP (!) 144/95   Pulse 97   Temp 98.2 F (36.8 C) (Oral)   Ht 6' (1.829 m)   Wt 264 lb 9.6 oz (120 kg)   SpO2 96%   BMI 35.89 kg/m    BP/Weight 12/17/2016 11/01/2016 8/34/6219  Systolic BP 471 252 712  Diastolic BP 95 98 929  Wt. (Lbs) 264.6 - 272  BMI 35.89 - 36.89      Physical Exam  Constitutional: He is oriented to person, place, and time. He appears well-developed and well-nourished.  Cardiovascular: Normal rate, normal heart sounds and intact distal pulses.   No murmur heard. Pulmonary/Chest: Effort normal and breath sounds normal. He has no wheezes. He has no rales. He exhibits no tenderness.  Abdominal: Soft. Bowel sounds are normal. He exhibits no distension and no mass. There is no tenderness.  Musculoskeletal: Normal range of motion.  Neurological: He is alert and oriented to person, place, and time.  Skin: Skin is warm and dry.  Psychiatric: He has a normal mood and affect.        Lab Results  Component Value Date   HGBA1C 12.2 12/17/2016    CMP Latest Ref Rng & Units 04/11/2016 08/08/2015 01/19/2015  Glucose 65 - 99 mg/dL 115(H) 184(H) 182(H)  BUN 7 - 25 mg/dL 10 11 11   Creatinine 0.70 - 1.33 mg/dL 0.89 0.83 0.78  Sodium 135 - 146 mmol/L 140 138 137  Potassium 3.5 - 5.3 mmol/L 4.2 4.1 4.4  Chloride 98 - 110 mmol/L 103 108 103  CO2 20 - 31 mmol/L 25 25 23   Calcium 8.6 - 10.3 mg/dL 9.8 9.1 9.0  Total Protein 6.1 - 8.1 g/dL 7.4 7.2 -  Total Bilirubin 0.2 - 1.2 mg/dL 0.3 0.3 -  Alkaline Phos 40 - 115 U/L 86 80 -  AST 10 - 35 U/L 20 18 -  ALT 9 - 46 U/L 25 19 -    Lipid Panel     Component Value Date/Time   CHOL 134 08/08/2015 0845   TRIG 168 (H) 08/08/2015 0845   HDL 37 (L) 08/08/2015 0845   CHOLHDL 3.6 08/08/2015 0845   VLDL 34 (H) 08/08/2015 0845   LDLCALC 63 08/08/2015 0845    Lab Results  Component Value Date   TSH 1.34 04/11/2016    Assessment & Plan:   1. Type 2 diabetes mellitus with hyperglycemia, without long-term current use of insulin (HCC) Uncontrolled with A1c of 12.2 which has trended up from 7.8 previously NovoLog 20 units administered due to CBG of 427 and  CBG repeated after 30 minutes Increased dose of glipizide Emphasize compliance with diabetic diet, lifestyle modifications - POCT glucose (manual entry) - POCT glycosylated hemoglobin (Hb A1C) - metFORMIN (GLUCOPHAGE) 500 MG tablet; Take 3 tabs (1585m) in the morning and 2 tabs (10060m in the evening.  Dispense: 450 tablet; Refill: 1 - glipiZIDE (GLUCOTROL XL) 10 MG 24 hr tablet; Take 2 tablets (20 mg total) by mouth daily with breakfast.  Dispense: 180 tablet; Refill: 1 - CMP14+EGFR; Future - Lipid panel; Future - Microalbumin/Creatinine Ratio, Urine; Future  2. Hypothyroidism, unspecified type Controlled - levothyroxine (SYNTHROID, LEVOTHROID) 175 MCG tablet; TAKE 1 TABLET BY MOUTH ONCE DAILY BEFORE BREAKFAST  Dispense: 90 tablet; Refill: 1 -  TSH; Future  3. Chronic gout without tophus, unspecified cause, unspecified site No acute flare Avoid foods that trigger gout - allopurinol (ZYLOPRIM) 300 MG tablet; Take 1 tablet (300 mg total) by mouth daily.  Dispense: 90 tablet; Refill: 1  4. Essential hypertension Uncontrolled due to running out of medications - lisinopril (PRINIVIL,ZESTRIL) 20 MG tablet; Take 1 tablet (20 mg total) by mouth daily.  Dispense: 90 tablet; Refill: 1 - amLODipine (NORVASC) 10 MG tablet; Take 1 tablet (10 mg total) by mouth daily.  Dispense: 90 tablet; Refill: 1   Meds ordered this encounter  Medications  . metFORMIN (GLUCOPHAGE) 500 MG tablet    Sig: Take 3 tabs (1541m) in the morning and 2 tabs (10027m in the evening.    Dispense:  450 tablet    Refill:  1  . levothyroxine (SYNTHROID, LEVOTHROID) 175 MCG tablet    Sig: TAKE 1 TABLET BY MOUTH ONCE DAILY BEFORE BREAKFAST    Dispense:  90 tablet    Refill:  1  . glipiZIDE (GLUCOTROL XL) 10 MG 24 hr tablet    Sig: Take 2 tablets (20 mg total) by mouth daily with breakfast.    Dispense:  180 tablet    Refill:  1  . allopurinol (ZYLOPRIM) 300 MG tablet    Sig: Take 1 tablet (300 mg total) by mouth  daily.    Dispense:  90 tablet    Refill:  1  . lisinopril (PRINIVIL,ZESTRIL) 20 MG tablet    Sig: Take 1 tablet (20 mg total) by mouth daily.    Dispense:  90 tablet    Refill:  1  . amLODipine (NORVASC) 10 MG tablet    Sig: Take 1 tablet (10 mg total) by mouth daily.    Dispense:  90 tablet    Refill:  1    Follow-up: Return in about 3 months (around 03/19/2017) for Follow-up on Diabetes.   EnArnoldo MoraleD

## 2016-12-19 ENCOUNTER — Emergency Department (HOSPITAL_COMMUNITY)
Admission: EM | Admit: 2016-12-19 | Discharge: 2016-12-19 | Disposition: A | Discharge status: Home / Self Care | Payer: Self-pay | Attending: Emergency Medicine | Admitting: Emergency Medicine

## 2016-12-19 ENCOUNTER — Encounter (HOSPITAL_COMMUNITY): Payer: Self-pay | Admitting: Emergency Medicine

## 2016-12-19 DIAGNOSIS — E039 Hypothyroidism, unspecified: Secondary | ICD-10-CM | POA: Insufficient documentation

## 2016-12-19 DIAGNOSIS — Z7984 Long term (current) use of oral hypoglycemic drugs: Secondary | ICD-10-CM | POA: Insufficient documentation

## 2016-12-19 DIAGNOSIS — I10 Essential (primary) hypertension: Secondary | ICD-10-CM | POA: Insufficient documentation

## 2016-12-19 DIAGNOSIS — R739 Hyperglycemia, unspecified: Secondary | ICD-10-CM

## 2016-12-19 DIAGNOSIS — E1165 Type 2 diabetes mellitus with hyperglycemia: Secondary | ICD-10-CM | POA: Insufficient documentation

## 2016-12-19 DIAGNOSIS — K648 Other hemorrhoids: Secondary | ICD-10-CM | POA: Insufficient documentation

## 2016-12-19 DIAGNOSIS — Z87891 Personal history of nicotine dependence: Secondary | ICD-10-CM | POA: Insufficient documentation

## 2016-12-19 DIAGNOSIS — Z79899 Other long term (current) drug therapy: Secondary | ICD-10-CM | POA: Insufficient documentation

## 2016-12-19 DIAGNOSIS — K644 Residual hemorrhoidal skin tags: Secondary | ICD-10-CM

## 2016-12-19 LAB — CBG MONITORING, ED
GLUCOSE-CAPILLARY: 226 mg/dL — AB (ref 65–99)
Glucose-Capillary: 193 mg/dL — ABNORMAL HIGH (ref 65–99)

## 2016-12-19 LAB — BASIC METABOLIC PANEL
ANION GAP: 12 (ref 5–15)
BUN: 11 mg/dL (ref 6–20)
CALCIUM: 9.4 mg/dL (ref 8.9–10.3)
CHLORIDE: 103 mmol/L (ref 101–111)
CO2: 22 mmol/L (ref 22–32)
CREATININE: 0.85 mg/dL (ref 0.61–1.24)
GFR calc non Af Amer: >60 mL/min (ref >60)
Glucose, Bld: 269 mg/dL — ABNORMAL HIGH (ref 65–99)
Potassium: 3.8 mmol/L (ref 3.5–5.1)
SODIUM: 137 mmol/L (ref 135–145)

## 2016-12-19 LAB — CBC
HCT: 43.3 % (ref 39.0–52.0)
HEMOGLOBIN: 14.5 g/dL (ref 13.0–17.0)
MCH: 28.2 pg (ref 26.0–34.0)
MCHC: 33.5 g/dL (ref 30.0–36.0)
MCV: 84.1 fL (ref 78.0–100.0)
PLATELETS: 268 10*3/uL (ref 150–400)
RBC: 5.15 MIL/uL (ref 4.22–5.81)
RDW: 13.4 % (ref 11.5–15.5)
WBC: 10.3 10*3/uL (ref 4.0–10.5)

## 2016-12-19 LAB — URINALYSIS, ROUTINE W REFLEX MICROSCOPIC
BILIRUBIN URINE: NEGATIVE
Bacteria, UA: NONE SEEN
GLUCOSE, UA: 150 mg/dL — AB
Hgb urine dipstick: NEGATIVE
KETONES UR: 5 mg/dL — AB
LEUKOCYTES UA: NEGATIVE
Nitrite: NEGATIVE
PH: 5 (ref 5.0–8.0)
Protein, ur: 30 mg/dL — AB
Specific Gravity, Urine: 1.025 (ref 1.005–1.030)

## 2016-12-19 MED ORDER — LIDOCAINE 2 % EX GEL: 1.0000 | Freq: Three times a day (TID) | CUTANEOUS | 3 refills | Status: DC

## 2016-12-19 MED ORDER — LIDOCAINE VISCOUS 2 % MT SOLN: 20.0000 mL | OROMUCOSAL | 2 refills | Status: DC | PRN

## 2016-12-19 MED ORDER — HYDROCORTISONE 2.5 % RE CREA: TOPICAL_CREAM | RECTAL | 1 refills | Status: DC

## 2016-12-19 NOTE — ED Triage Notes (Signed)
Pt comes in with complaints of pain related to external hemorrhoids.  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  Also endorses that he has diabetes and recently had a high sugar but also was able to get back on his medications.

## 2016-12-19 NOTE — Discharge Instructions (Signed)
Prescription for steroid cream and numbing medicine for your rectum.  You will need to follow-up with [facility].  Phone number given.

## 2016-12-21 NOTE — ED Provider Notes (Signed)
[place] DEPT Provider Note   CSN: [id number] Arrival date & time: 12/19/16  1325     History   Chief Complaint Chief Complaint  Patient presents with  . Rectal Pain  . Hyperglycemia    HPI Ray Garner is a 61 y.o. male.  Patient presents with a concern about a hemorrhoid and his glucose.  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  His glucose has also been fickle.  No fever, sweats, chills.  Severity of pain is moderate.      Past Medical History:  Diagnosis Date  . Arthritis   . Back pain, lumbosacral 06/30/2013  . DM type 2 (diabetes mellitus, type 2) ([place]) 06/30/2013  . Gout   . HTN (hypertension) 06/30/2013  . Hypertension   . Hypothyroid 06/30/2013    Patient Active Problem List   Diagnosis Date Noted  . Carpal tunnel syndrome 08/01/2015  . DM type 2 (diabetes mellitus, type 2) ([place]) 06/30/2013  . HTN (hypertension) 06/30/2013  . Gout 06/30/2013  . Back pain, lumbosacral 06/30/2013  . Testicular cyst s/p surgery 06/30/2013  . Hypothyroidism 06/30/2013    Past Surgical History:  Procedure Laterality Date  . CYST EXCISION    . FRACTURE SURGERY         Home Medications    Prior to Admission medications   Medication Sig Start Date End Date Taking? Authorizing Provider  allopurinol (ZYLOPRIM) 300 MG tablet Take 1 tablet (300 mg total) by mouth daily. 12/17/16  Yes Arnoldo Morale, MD  amLODipine (NORVASC) 10 MG tablet Take 1 tablet (10 mg total) by mouth daily. 12/17/16  Yes Arnoldo Morale, MD  glipiZIDE (GLUCOTROL XL) 10 MG 24 hr tablet Take 2 tablets (20 mg total) by mouth daily with breakfast. 12/17/16  Yes Amao, Enobong, MD  levothyroxine (SYNTHROID, LEVOTHROID) 175 MCG tablet TAKE 1 TABLET BY MOUTH ONCE DAILY BEFORE BREAKFAST 12/17/16  Yes Amao, Charlane Ferretti, MD  lisinopril (PRINIVIL,ZESTRIL) 20 MG tablet Take 1 tablet (20 mg total) by mouth daily. 12/17/16  Yes Arnoldo Morale, MD  metFORMIN  (GLUCOPHAGE) 500 MG tablet Take 3 tabs (1500mg ) in the morning and 2 tabs (1000mg ) in the evening. 12/17/16  Yes Arnoldo Morale, MD  Potassium 95 MG TABS Take 1 tablet by mouth daily.   Yes [provider]  vitamin B-12 (CYANOCOBALAMIN) 1000 MCG tablet Take 1,000 mcg by mouth daily.   Yes [provider]  glucose blood (BAYER CONTOUR TEST) test strip Use as instructed Patient not taking: Reported on 12/17/2016 03/14/15   Tresa Garter, MD  hydrocortisone (ANUSOL-HC) 2.5 % rectal cream Apply rectally 2 times daily 12/19/16   Nat Christen, MD  lidocaine (XYLOCAINE) 2 % solution Use as directed 20 mLs in the mouth or throat as needed for mouth pain. 12/19/16   Nat Christen, MD  Lidocaine 2 % GEL Apply 1 Applicatorful topically 3 (three) times daily. 12/19/16   Nat Christen, MD    Family History No family history on file.  Social History Social History  Substance Use Topics  . Smoking status: Former Smoker    Quit date: 05/14/1983  . Smokeless tobacco: Former Systems developer  . Alcohol use Yes     Allergies   Vicodin [hydrocodone-acetaminophen]   Review of Systems Review of Systems  All other systems reviewed and are negative.    Physical Exam Updated Vital Signs BP 138/90   Pulse 79   Temp 97.8 F (36.6 C) (Oral)  Resp 15   Ht 6' (1.829 m)   Wt 119.7 kg (264 lb)   SpO2 97%   BMI 35.80 kg/m   Physical Exam  Constitutional: He is oriented to person, place, and time. He appears well-developed and well-nourished.  HENT:  Head: Normocephalic and atraumatic.  Eyes: Conjunctivae are normal.  Neck: Neck supple.  Cardiovascular: Normal rate and regular rhythm.   Pulmonary/Chest: Effort normal and breath sounds normal.  Abdominal: Soft. Bowel sounds are normal.  Genitourinary:  Genitourinary Comments: 1.5 cm external hemorrhoid.  No perirectal abscess  Musculoskeletal: Normal range of motion.  Neurological: He is alert and oriented to person, place, and time.    Skin: Skin is warm and dry.  Psychiatric: He has a normal mood and affect. His behavior is normal.  Nursing note and vitals reviewed.    ED Treatments / Results  Labs (all labs ordered are listed, but only abnormal results are displayed) Labs Reviewed  BASIC METABOLIC PANEL - Abnormal; Notable for the following:       Result Value   Glucose, Bld 269 (*)    All other components within normal limits  URINALYSIS, ROUTINE W REFLEX MICROSCOPIC - Abnormal; Notable for the following:    Glucose, UA 150 (*)    Ketones, ur 5 (*)    Protein, ur 30 (*)    Squamous Epithelial / LPF 0-5 (*)    All other components within normal limits  CBG MONITORING, ED - Abnormal; Notable for the following:    Glucose-Capillary 226 (*)    All other components within normal limits  CBG MONITORING, ED - Abnormal; Notable for the following:    Glucose-Capillary 193 (*)    All other components within normal limits  CBC    EKG  EKG Interpretation None       Radiology No results found.  Procedures Procedures (including critical care time)  Medications Ordered in ED Medications - No data to display   Initial Impression / Assessment and Plan / ED Course  I have reviewed the triage vital signs and the nursing notes.  Pertinent labs & imaging results that were available during my care of the patient were reviewed by me and considered in my medical decision making (see chart for details).     Patient is in no acute distress.  His glucose is slightly elevated.  He has an obvious external hemorrhoid.  Will Rx Xylocaine 2% gel and Anusol suppository.  Referral to general surgery for outpatient treatment of hemorrhoid  Final Clinical Impressions(s) / ED Diagnoses   Final diagnoses:  External hemorrhoid  Hyperglycemia    New Prescriptions Discharge Medication List as of 12/19/2016  8:14 PM    START taking these medications   Details  hydrocortisone (ANUSOL-HC) 2.5 % rectal cream Apply  rectally 2 times daily, Print    lidocaine (XYLOCAINE) 2 % solution Use as directed 20 mLs in the mouth or throat as needed for mouth pain., Starting Wed 12/19/2016, Print    Lidocaine 2 % GEL Apply 1 Applicatorful topically 3 (three) times daily., Starting Wed 12/19/2016, Print         Nat Christen, MD 12/21/16 2202

## 2017-06-04 ENCOUNTER — Other Ambulatory Visit: Payer: Self-pay | Admitting: Family Medicine

## 2017-06-04 DIAGNOSIS — E1165 Type 2 diabetes mellitus with hyperglycemia: Secondary | ICD-10-CM

## 2017-07-08 ENCOUNTER — Ambulatory Visit: Payer: Self-pay | Admitting: Family Medicine

## 2017-07-08 ENCOUNTER — Encounter: Payer: Self-pay | Admitting: Family Medicine

## 2017-07-08 VITALS — BP 130/90 | HR 83 | Ht 72.0 in | Wt 266.0 lb

## 2017-07-08 DIAGNOSIS — Z0000 Encounter for general adult medical examination without abnormal findings: Secondary | ICD-10-CM | POA: Insufficient documentation

## 2017-07-08 DIAGNOSIS — E039 Hypothyroidism, unspecified: Secondary | ICD-10-CM

## 2017-07-08 DIAGNOSIS — E538 Deficiency of other specified B group vitamins: Secondary | ICD-10-CM | POA: Insufficient documentation

## 2017-07-08 DIAGNOSIS — M1A9XX0 Chronic gout, unspecified, without tophus (tophi): Secondary | ICD-10-CM | POA: Diagnosis not present

## 2017-07-08 DIAGNOSIS — Z125 Encounter for screening for malignant neoplasm of prostate: Secondary | ICD-10-CM | POA: Diagnosis not present

## 2017-07-08 DIAGNOSIS — I10 Essential (primary) hypertension: Secondary | ICD-10-CM | POA: Diagnosis not present

## 2017-07-08 DIAGNOSIS — E1165 Type 2 diabetes mellitus with hyperglycemia: Secondary | ICD-10-CM

## 2017-07-08 LAB — POCT GLYCOSYLATED HEMOGLOBIN (HGB A1C): HEMOGLOBIN A1C: 12

## 2017-07-08 MED ORDER — ALLOPURINOL 300 MG PO TABS: 300.0000 mg | ORAL_TABLET | Freq: Every day | ORAL | 1 refills | Status: DC

## 2017-07-08 MED ORDER — METFORMIN HCL 500 MG PO TABS: 1000.0000 mg | ORAL_TABLET | Freq: Two times a day (BID) | ORAL | 0 refills | Status: DC

## 2017-07-08 MED ORDER — LISINOPRIL 10 MG PO TABS: 10.0000 mg | ORAL_TABLET | Freq: Every day | ORAL | 0 refills | Status: DC

## 2017-07-08 MED ORDER — GLUCOSE BLOOD VI STRP: ORAL_STRIP | 12 refills | Status: AC

## 2017-07-08 MED ORDER — INSULIN GLARGINE 100 UNIT/ML ~~LOC~~ SOLN: SUBCUTANEOUS | 11 refills | Status: DC

## 2017-07-08 MED ORDER — AMLODIPINE BESYLATE 10 MG PO TABS: 10.0000 mg | ORAL_TABLET | Freq: Every day | ORAL | 1 refills | Status: DC

## 2017-07-08 NOTE — Patient Instructions (Signed)
Blood Glucose Monitoring, Adult Monitoring your blood sugar (glucose) helps you manage your diabetes. It also helps you and your health care provider determine how well your diabetes management plan is working. Blood glucose monitoring involves checking your blood glucose as often as directed, and keeping a record (log) of your results over time. Why should I monitor my blood glucose? Checking your blood glucose regularly can:  Help you understand how food, exercise, illnesses, and medicines affect your blood glucose.  Let you know what your blood glucose is at any time. You can quickly tell if you are having low blood glucose (hypoglycemia) or high blood glucose (hyperglycemia).  Help you and your health care provider adjust your medicines as needed.  When should I check my blood glucose? Follow instructions from your health care provider about how often to check your blood glucose. This may depend on:  The type of diabetes you have.  How well-controlled your diabetes is.  Medicines you are taking.  If you have type 1 diabetes:  Check your blood glucose at least 2 times a day.  Also check your blood glucose: ? Before every insulin injection. ? Before and after exercise. ? Between meals. ? 2 hours after a meal. ? Occasionally between 2:00 a.m. and 3:00 a.m., as directed. ? Before potentially dangerous tasks, like driving or using heavy machinery. ? At bedtime.  You may need to check your blood glucose more often, up to 6-10 times a day: ? If you use an insulin pump. ? If you need multiple daily injections (MDI). ? If your diabetes is not well-controlled. ? If you are ill. ? If you have a history of severe hypoglycemia. ? If you have a history of not knowing when your blood glucose is getting low (hypoglycemia unawareness). If you have type 2 diabetes:  If you take insulin or other diabetes medicines, check your blood glucose at least 2 times a day.  If you are on intensive  insulin therapy, check your blood glucose at least 4 times a day. Occasionally, you may also need to check between 2:00 a.m. and 3:00 a.m., as directed.  Also check your blood glucose: ? Before and after exercise. ? Before potentially dangerous tasks, like driving or using heavy machinery.  You may need to check your blood glucose more often if: ? Your medicine is being adjusted. ? Your diabetes is not well-controlled. ? You are ill. What is a blood glucose log?  A blood glucose log is a record of your blood glucose readings. It helps you and your health care provider: ? Look for patterns in your blood glucose over time. ? Adjust your diabetes management plan as needed.  Every time you check your blood glucose, write down your result and notes about things that may be affecting your blood glucose, such as your diet and exercise for the day.  Most glucose meters store a record of glucose readings in the meter. Some meters allow you to download your records to a computer. How do I check my blood glucose? Follow these steps to get accurate readings of your blood glucose: Supplies needed   Blood glucose meter.  Test strips for your meter. Each meter has its own strips. You must use the strips that come with your meter.  A needle to prick your finger (lancet). Do not use lancets more than once.  A device that holds the lancet (lancing device).  A journal or log book to write down your results. Procedure  Wash your hands with soap and water.  Prick the side of your finger (not the tip) with the lancet. Use a different finger each time.  Gently rub the finger until a small drop of blood appears.  Follow instructions that come with your meter for inserting the test strip, applying blood to the strip, and using your blood glucose meter.  Write down your result and any notes. Alternative testing sites  Some meters allow you to use areas of your body other than your finger  (alternative sites) to test your blood.  If you think you may have hypoglycemia, or if you have hypoglycemia unawareness, do not use alternative sites. Use your finger instead.  Alternative sites may not be as accurate as the fingers, because blood flow is slower in these areas. This means that the result you get may be delayed, and it may be different from the result that you would get from your finger.  The most common alternative sites are: ? Forearm. ? Thigh. ? Palm of the hand. Additional tips  Always keep your supplies with you.  If you have questions or need help, all blood glucose meters have a 24-hour "hotline" number that you can call. You may also contact your health care provider.  After you use a few boxes of test strips, adjust (calibrate) your blood glucose meter by following instructions that came with your meter. This information is not intended to replace advice given to you by your health care provider. Make sure you discuss any questions you have with your health care provider. Document Released: 02/15/2003 Document Revised: 09/02/2015 Document Reviewed: 07/25/2015 Elsevier Interactive Patient Education  2017 [place].  Diabetes Mellitus and Nutrition When you have diabetes (diabetes mellitus), it is very important to have healthy eating habits because your blood sugar (glucose) levels are greatly affected by what you eat and drink. Eating healthy foods in the appropriate amounts, at about the same times every day, can help you:  Control your blood glucose.  Lower your risk of heart disease.  Improve your blood pressure.  Reach or maintain a healthy weight.  Every person with diabetes is different, and each person has different needs for a meal plan. Your health care provider may recommend that you work with a diet and nutrition specialist (dietitian) to make a meal plan that is best for you. Your meal plan may vary depending on factors such as:  The  calories you need.  The medicines you take.  Your weight.  Your blood glucose, blood pressure, and cholesterol levels.  Your activity level.  Other health conditions you have, such as heart or kidney disease.  How do carbohydrates affect me? Carbohydrates affect your blood glucose level more than any other type of food. Eating carbohydrates naturally increases the amount of glucose in your blood. Carbohydrate counting is a method for keeping track of how many carbohydrates you eat. Counting carbohydrates is important to keep your blood glucose at a healthy level, especially if you use insulin or take certain oral diabetes medicines. It is important to know how many carbohydrates you can safely have in each meal. This is different for every person. Your dietitian can help you calculate how many carbohydrates you should have at each meal and for snack. Foods that contain carbohydrates include:  Bread, cereal, rice, pasta, and crackers.  Potatoes and corn.  Peas, beans, and lentils.  Milk and yogurt.  Fruit and juice.  Desserts, such as cakes, cookies, ice cream, and candy.  How does alcohol affect me? Alcohol can cause a sudden decrease in blood glucose (hypoglycemia), especially if you use insulin or take certain oral diabetes medicines. Hypoglycemia can be a life-threatening condition. Symptoms of hypoglycemia (sleepiness, dizziness, and confusion) are similar to symptoms of having too much alcohol. If your health care provider says that alcohol is safe for you, follow these guidelines:  Limit alcohol intake to no more than 1 drink per day for nonpregnant women and 2 drinks per day for men. One drink equals 12 oz of beer, 5 oz of wine, or 1 oz of hard liquor.  Do not drink on an empty stomach.  Keep yourself hydrated with water, diet soda, or unsweetened iced tea.  Keep in mind that regular soda, juice, and other mixers may contain a lot of sugar and must be counted as  carbohydrates.  What are tips for following this plan? Reading food labels  Start by checking the serving size on the label. The amount of calories, carbohydrates, fats, and other nutrients listed on the label are based on one serving of the food. Many foods contain more than one serving per package.  Check the total grams (g) of carbohydrates in one serving. You can calculate the number of servings of carbohydrates in one serving by dividing the total carbohydrates by 15. For example, if a food has 30 g of total carbohydrates, it would be equal to 2 servings of carbohydrates.  Check the number of grams (g) of saturated and trans fats in one serving. Choose foods that have low or no amount of these fats.  Check the number of milligrams (mg) of sodium in one serving. Most people should limit total sodium intake to less than 2,300 mg per day.  Always check the nutrition information of foods labeled as "low-fat" or "nonfat". These foods may be higher in added sugar or refined carbohydrates and should be avoided.  Talk to your dietitian to identify your daily goals for nutrients listed on the label. Shopping  Avoid buying canned, premade, or processed foods. These foods tend to be high in fat, sodium, and added sugar.  Shop around the outside edge of the grocery store. This includes fresh fruits and vegetables, bulk grains, fresh meats, and fresh dairy. Cooking  Use low-heat cooking methods, such as baking, instead of high-heat cooking methods like deep frying.  Cook using healthy oils, such as olive, canola, or sunflower oil.  Avoid cooking with butter, cream, or high-fat meats. Meal planning  Eat meals and snacks regularly, preferably at the same times every day. Avoid going long periods of time without eating.  Eat foods high in fiber, such as fresh fruits, vegetables, beans, and whole grains. Talk to your dietitian about how many servings of carbohydrates you can eat at each  meal.  Eat 4-6 ounces of lean protein each day, such as lean meat, chicken, fish, eggs, or tofu. 1 ounce is equal to 1 ounce of meat, chicken, or fish, 1 egg, or 1/4 cup of tofu.  Eat some foods each day that contain healthy fats, such as avocado, nuts, seeds, and fish. Lifestyle   Check your blood glucose regularly.  Exercise at least 30 minutes 5 or more days each week, or as told by your health care provider.  Take medicines as told by your health care provider.  Do not use any products that contain nicotine or tobacco, such as cigarettes and e-cigarettes. If you need help quitting, ask your health care provider.  Work  with a counselor or diabetes educator to identify strategies to manage stress and any emotional and social challenges. What are some questions to ask my health care provider?  Do I need to meet with a diabetes educator?  Do I need to meet with a dietitian?  What number can I call if I have questions?  When are the best times to check my blood glucose? Where to find more information:  American Diabetes Association: diabetes.org/food-and-fitness/food  Academy of Nutrition and Dietetics: PokerClues.dk  [employer] of Diabetes and Digestive and Kidney Diseases (NIH): ContactWire.be Summary  A healthy meal plan will help you control your blood glucose and maintain a healthy lifestyle.  Working with a diet and nutrition specialist (dietitian) can help you make a meal plan that is best for you.  Keep in mind that carbohydrates and alcohol have immediate effects on your blood glucose levels. It is important to count carbohydrates and to use alcohol carefully. This information is not intended to replace advice given to you by your health care provider. Make sure you discuss any questions you have with your health care provider. Document  Released: 11/09/2004 Document Revised: 03/19/2016 Document Reviewed: 03/19/2016 Elsevier Interactive Patient Education  2018 [employer].  Insulin Injection Instructions, Single Insulin Dose, Adult A subcutaneous injection is a shot of medicine that is injected into the layer of fat between skin and muscle. People with type 1 diabetes must take insulin because their bodies do not make it. People with type 2 diabetes may need to take insulin. There are many different types of insulin. The type of insulin that you take may determine how many injections you give yourself and when you need to take the injections. Choosing a site for injection Insulin absorption varies from site to site. It is best to inject insulin within the same body area, using a different spot in that area for each injection. Do not inject the insulin in the same spot for each injection. There are five main areas that can be used for injecting. These areas include:  Abdomen. This is the preferred area.  Front of thigh.  Upper, outer side of thigh.  Back of upper arm.  Buttocks.  Using a syringe and vial: single insulin dose First, follow the steps for Getting Ready, then continue with the steps for Pushing Air Into the [place], then follow the steps for Filling the Syringe, and finish with the steps for Injecting the Insulin. Getting Ready  1. Wash your hands with soap and water. If soap and water are not available, use hand sanitizer. 2. Check the expiration date and type of insulin. 3. If you are using CLEAR insulin, check to see that it is clear and free of clumps. 4. Gently roll the medicine bottle (vial) between your palms to warm it. Do not shake the vial. 5. Remove the plastic pop-top covering from the vial, if one is present. 6. Use an alcohol wipe to clean the rubber top of the vial. 7. Remove the plastic cover from the needle on the syringe. Do not let the needle touch anything. Pushing Air Into the  [place]  1. Pull the plunger back to bring (draw up) air into the syringe. The amount of air should be the same as the number of units in a dose of single insulin. 2. While you keep the vial right-side up, poke the needle through the rubber top of the vial. Do not turn the vial upside down to do this. 3. Push the  plunger in all the way. This pushes air into the vial. 4. Do not take the needle out of the vial yet. Filling the Syringe 1. With the needle still inserted in the vial, turn the vial upside down and hold it at eye level. 2. Slowly pull back on the plunger to draw up the desired number of insulin units into the syringe. 3. If you see air bubbles in the syringe, slowly move the plunger up and down 2 or 3 times to get rid of them. 4. Pull back the plunger until the syringe is filled to the correct dose. 5. Remove the needle from the vial. Do not let the needle touch anything. Injecting the Insulin  1. Use an alcohol wipe to clean the site where you will be injecting the needle. Let the site air-dry. 2. Hold the syringe in your writing hand like a pencil. 3. Use your other hand to pinch and hold about an inch of skin. Do not directly touch the cleaned part of the skin. 4. Gently but quickly, put the needle straight into the skin. The needle should be at a 90-degree angle (perpendicular) to the skin, as if to form the letter "L." ? For example, if you are giving an injection in the abdomen, the abdomen forms one "leg" of the "L" and the needle forms the other "leg" of the "L." 5. For adults who have a small amount of body fat, the needle may need to be injected at a 45-degree angle instead. Your health care provider will tell you if this is necessary. ? A 45-degree angle looks like the letter "V." 6. Push the needle in as far as it will go (to the hub). 7. When the needle is completely inserted into the skin, use the thumb of your writing hand to push the plunger down all the way to inject the  insulin. 8. Let go of the skin that you are pinching. Continue to hold the syringe in place with your writing hand. 9. Wait 5 seconds, then pull the needle straight out of the skin. 10. Press and hold the alcohol wipe over the injection site until any bleeding stops. Do not rub the area. 11. Do not put the plastic cover back on the needle. 12. Discard the syringe and needle directly into a sharps container, such as an empty plastic bottle with a cover. Throwing away supplies   Discard all used needles in a puncture-proof sharps disposal container. You can ask your local pharmacy about where you can get this kind of disposal container, or you can use an empty liquid laundry detergent bottle that has a cover.  Follow the disposal regulations for the area where you live. Do not use any syringe or needle more than one time.  Throw away empty vials in the regular trash. What questions should I ask my health care provider?  How often should I be taking insulin?  How often should I check my blood glucose?  What amount of insulin should I be taking at each time?  What are the side effects?  What should I do if my blood glucose is too high?  What should I do if my blood glucose is too low?  What should I do if I forget to take my insulin?  What number should I call if I have questions? Where can I get more information?  American Diabetes Association (ADA): www.diabetes.org  American Association of Diabetes Educators (AADE) Patient Resources: https://www.diabeteseducator.org/patient-resources This information is not  intended to replace advice given to you by your health care provider. Make sure you discuss any questions you have with your health care provider. Document Released: 03/18/2015 Document Revised: 07/21/2015 Document Reviewed: 03/18/2015 Elsevier Interactive Patient Education  2018 [place]. Insulin Glargine injection What is this medicine? INSULIN GLARGINE (IN su lin  GLAR geen) is a human-made form of insulin. This drug lowers the amount of sugar in your blood. It is a long-acting insulin that is usually given once a day. This medicine may be used for other purposes; ask your health care provider or pharmacist if you have questions. COMMON BRAND NAME(S): BASAGLAR, Lantus, Lantus SoloStar, Toujeo SoloStar What should I tell my health care provider before I take this medicine? They need to know if you have any of these conditions: -episodes of low blood sugar -kidney disease -liver disease -an unusual or allergic reaction to insulin, metacresol, other medicines, foods, dyes, or preservatives -pregnant or trying to get pregnant -breast-feeding How should I use this medicine? This medicine is for injection under the skin. Use this medicine at the same time each day. Use exactly as directed. This insulin should never be mixed in the same syringe with other insulins before injection. Do not vigorously shake before use. You will be taught how to use this medicine and how to adjust doses for activities and illness. Do not use more insulin than prescribed. Always check the appearance of your insulin before using it. This medicine should be clear and colorless like water. Do not use it if it is cloudy, thickened, colored, or has solid particles in it. It is important that you put your used needles and syringes in a special sharps container. Do not put them in a trash can. If you do not have a sharps container, call your pharmacist or healthcare provider to get one. Talk to your pediatrician regarding the use of this medicine in children. Special care may be needed. Overdosage: If you think you have taken too much of this medicine contact a poison control center or emergency room at once. NOTE: This medicine is only for you. Do not share this medicine with others. What if I miss a dose? It is important not to miss a dose. Your health care professional or doctor should  discuss a plan for missed doses with you. If you do miss a dose, follow their plan. Do not take double doses. What may interact with this medicine? -other medicines for diabetes Many medications may cause changes in blood sugar, these include: -alcohol containing beverages -antiviral medicines for HIV or AIDS -aspirin and aspirin-like drugs -certain medicines for blood pressure, heart disease, irregular heart beat -chromium -diuretics -male hormones, such as estrogens or progestins, birth control pills -fenofibrate -gemfibrozil -isoniazid -lanreotide -male hormones or anabolic steroids -MAOIs like Carbex, Eldepryl, Marplan, Nardil, and Parnate -medicines for weight loss -medicines for allergies, asthma, cold, or cough -medicines for depression, anxiety, or psychotic disturbances -niacin -nicotine -NSAIDs, medicines for pain and inflammation, like ibuprofen or naproxen -octreotide -pasireotide -pentamidine -phenytoin -probenecid -quinolone antibiotics such as ciprofloxacin, levofloxacin, ofloxacin -some herbal dietary supplements -steroid medicines such as prednisone or cortisone -sulfamethoxazole; trimethoprim -thyroid hormones Some medications can hide the warning symptoms of low blood sugar (hypoglycemia). You may need to monitor your blood sugar more closely if you are taking one of these medications. These include: -beta-blockers, often used for high blood pressure or heart problems (examples include atenolol, metoprolol, propranolol) -clonidine -guanethidine -reserpine This list may not describe all possible interactions.  Give your health care provider a list of all the medicines, herbs, non-prescription drugs, or dietary supplements you use. Also tell them if you smoke, drink alcohol, or use illegal drugs. Some items may interact with your medicine. What should I watch for while using this medicine? Visit your health care professional or doctor for regular checks on  your progress. Do not drive, use machinery, or do anything that needs mental alertness until you know how this medicine affects you. Alcohol may interfere with the effect of this medicine. Avoid alcoholic drinks. A test called the HbA1C (A1C) will be monitored. This is a simple blood test. It measures your blood sugar control over the last 2 to 3 months. You will receive this test every 3 to 6 months. Learn how to check your blood sugar. Learn the symptoms of low and high blood sugar and how to manage them. Always carry a quick-source of sugar with you in case you have symptoms of low blood sugar. Examples include hard sugar candy or glucose tablets. Make sure others know that you can choke if you eat or drink when you develop serious symptoms of low blood sugar, such as seizures or unconsciousness. They must get medical help at once. Tell your doctor or health care professional if you have high blood sugar. You might need to change the dose of your medicine. If you are sick or exercising more than usual, you might need to change the dose of your medicine. Do not skip meals. Ask your doctor or health care professional if you should avoid alcohol. Many nonprescription cough and cold products contain sugar or alcohol. These can affect blood sugar. Make sure that you have the right kind of syringe for the type of insulin you use. Try not to change the brand and type of insulin or syringe unless your health care professional or doctor tells you to. Switching insulin brand or type can cause dangerously high or low blood sugar. Always keep an extra supply of insulin, syringes, and needles on hand. Use a syringe one time only. Throw away syringe and needle in a closed container to prevent accidental needle sticks. Insulin pens and cartridges should never be shared. Even if the needle is changed, sharing may result in passing of viruses like hepatitis or HIV. Wear a medical ID bracelet or chain, and carry a card  that describes your disease and details of your medicine and dosage times. What side effects may I notice from receiving this medicine? Side effects that you should report to your doctor or health care professional as soon as possible: -allergic reactions like skin rash, itching or hives, swelling of the face, lips, or tongue -breathing problems -signs and symptoms of high blood sugar such as dizziness, dry mouth, dry skin, fruity breath, nausea, stomach pain, increased hunger or thirst, increased urination -signs and symptoms of low blood sugar such as feeling anxious, confusion, dizziness, increased hunger, unusually weak or tired, sweating, shakiness, cold, irritable, headache, blurred vision, fast heartbeat, loss of consciousness Side effects that usually do not require medical attention (report to your doctor or health care professional if they continue or are bothersome): -increase or decrease in fatty tissue under the skin due to overuse of a particular injection site -itching, burning, swelling, or rash at site where injected This list may not describe all possible side effects. Call your doctor for medical advice about side effects. You may report side effects to FDA at 1-800-FDA-1088. Where should I keep my medicine? Keep  out of the reach of children. Store unopened vials in a refrigerator between 2 and 8 degrees C (36 and 46 degrees F). Do not freeze or use if the insulin has been frozen. Opened vials (vials currently in use) may be stored in the refrigerator or at room temperature, at approximately 25 degrees C (77 degrees F) or cooler. Keeping your insulin at room temperature decreases the amount of pain during injection. Once opened, your insulin can be used for 28 days. After 28 days, the vial should be thrown away. Store Lantus [employer] mining in a refrigerator between 2 and 8 degrees C (36 and 46 degrees F) or at room temperature below 30 degrees C (86 degrees F). Do  not freeze or use if the insulin has been frozen. Once opened, the pens should be kept at room temperature. Do not store in the refrigerator once opened. Once opened, the insulin can be used for 28 days. After 28 days, the Lantus Solostar Pen or Basaglar KwikPen should be thrown away. Store [employer] in a refrigerator between 2 and 8 degrees C (36 and 46 degrees F). Do not freeze or use if the insulin has been frozen. Once opened, the pens should be kept at room temperature below 30 degrees C (86 degrees F). Do not store in the refrigerator once opened. Once opened, the insulin can be used for 42 days. After 42 days, the [employer] Pen should be thrown away. Protect from light and excessive heat. Throw away any unused medicine after the expiration date or after the specified time for room temperature storage has passed. NOTE: This sheet is a summary. It may not cover all possible information. If you have questions about this medicine, talk to your doctor, pharmacist, or health care provider.  2018 Elsevier/Gold Standard (2016-02-29 10:26:25)

## 2017-07-08 NOTE — Progress Notes (Addendum)
Subjective:  Patient ID: Ray Garner, male    DOB: February 11, 1956  Age: 62 y.o. MRN: [id number]  CC: Establish Care   HPI Ray Garner presents for establishment of care and treatment of multiple medical issues.  He tells [place] that his blood sugar was over 400 today.  Chart review shows that his hemoglobin A1c back in mid February was over 12.  At that time he was treated with high-dose Glucophage and glipizide.  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  Blood pressure is been controlled with amlodipine and lisinopril.  He is taking rather high dose levothyroid for hypothyroidism.  He assures me that he is taking it on a fasting stomach 1 hour prior to eating.  He has a history of gout that is been well controlled on daily allopurinol.  He says that he has not had a gouty attack since starting the allopurinol.  He is taking vitamin B12 but has no documented history of B12 deficiency.  He does admit to polyuria and weight loss.  He says that he has been a diabetic over the last 2 to 3 years.  He denies any visual difficulties.  Outpatient Medications Prior to Visit  Medication Sig Dispense Refill  . vitamin B-12 (CYANOCOBALAMIN) 1000 MCG tablet Take 1,000 mcg by mouth daily.    Marland Kitchen allopurinol (ZYLOPRIM) 300 MG tablet Take 1 tablet (300 mg total) by mouth daily. 90 tablet 1  . amLODipine (NORVASC) 10 MG tablet Take 1 tablet (10 mg total) by mouth daily. 90 tablet 1  . GLIPIZIDE XL 10 MG 24 hr tablet TAKE 2 TABLETS BY MOUTH ONCE DAILY WITH BREAKFAST 30 tablet 0  . glucose blood (BAYER CONTOUR TEST) test strip Use as instructed 100 each 12  . levothyroxine (SYNTHROID, LEVOTHROID) 175 MCG tablet TAKE 1 TABLET BY MOUTH ONCE DAILY BEFORE BREAKFAST 90 tablet 1  . metFORMIN (GLUCOPHAGE) 500 MG tablet Take 3 tabs (1500mg ) in the morning and 2 tabs (1000mg ) in the evening. 450 tablet 1  . hydrocortisone (ANUSOL-HC) 2.5 % rectal cream Apply rectally 2 times daily 28.35 g 1  .  lidocaine (XYLOCAINE) 2 % solution Use as directed 20 mLs in the mouth or throat as needed for mouth pain. 100 mL 2  . Lidocaine 2 % GEL Apply 1 Applicatorful topically 3 (three) times daily. 1 Tube 3  . lisinopril (PRINIVIL,ZESTRIL) 20 MG tablet Take 1 tablet (20 mg total) by mouth daily. 90 tablet 1  . Potassium 95 MG TABS Take 1 tablet by mouth daily.     No facility-administered medications prior to visit.     ROS Review of Systems  Constitutional: Negative for chills and fever.  HENT: Negative.   Eyes: Negative.  Negative for visual disturbance.  Respiratory: Negative.   Cardiovascular: Negative.   Gastrointestinal: Negative.   Endocrine: Positive for polyuria.  Genitourinary: Positive for frequency.  Skin: Negative for color change and rash.  Allergic/Immunologic: Negative for immunocompromised state.  Neurological: Negative for headaches.  Hematological: Does not bruise/bleed easily.  Psychiatric/Behavioral: Negative.     Objective:  BP 130/90   Pulse 83   Ht 6' (1.829 m)   Wt 266 lb (120.7 kg)   SpO2 98%   BMI 36.08 kg/m   BP Readings from Last 3 Encounters:  07/10/17 139/89  07/08/17 130/90  12/19/16 138/90    Wt Readings from Last 3 Encounters:  07/10/17 270 lb (122.5 kg)  07/08/17 266 lb (120.7 kg)  12/19/16 264 lb (119.7 kg)    Physical Exam  Constitutional: He is oriented to person, place, and time. He appears well-developed and well-nourished. No distress.  HENT:  Head: Normocephalic and atraumatic.  Right Ear: External ear normal.  Left Ear: External ear normal.  Mouth/Throat: Oropharynx is clear and moist.  Eyes: Pupils are equal, round, and reactive to light. Conjunctivae and EOM are normal. Right eye exhibits no discharge. Left eye exhibits no discharge. No scleral icterus.  Neck: Normal range of motion. Neck supple. No JVD present. No tracheal deviation present. No thyromegaly present.  Cardiovascular: Normal rate, regular rhythm and normal  heart sounds.  Pulmonary/Chest: Effort normal and breath sounds normal.  Abdominal: Bowel sounds are normal.  Lymphadenopathy:    He has no cervical adenopathy.  Neurological: He is alert and oriented to person, place, and time.  Skin: Skin is warm and dry. He is not diaphoretic. No erythema.  Psychiatric: He has a normal mood and affect. His behavior is normal.    Lab Results  Component Value Date   WBC 9.1 07/08/2017   HGB 13.9 07/08/2017   HCT 42.3 07/08/2017   PLT 302.0 07/08/2017   GLUCOSE 320 (H) 07/08/2017   CHOL 134 08/08/2015   TRIG 168 (H) 08/08/2015   HDL 37 (L) 08/08/2015   LDLCALC 63 08/08/2015   ALT 25 07/08/2017   AST 18 07/08/2017   NA 136 07/08/2017   K 4.4 07/08/2017   CL 99 07/08/2017   CREATININE 0.99 07/08/2017   BUN 10 07/08/2017   CO2 26 07/08/2017   TSH 1.94 07/08/2017   PSA 0.77 07/08/2017   HGBA1C 12 07/08/2017   MICROALBUR 12.5 (H) 07/08/2017    No results found.  Assessment & Plan:   Ray Garner was seen today for establish care.  Diagnoses and all orders for this visit:  Essential hypertension -     CBC -     Comprehensive metabolic panel -     Urinalysis, Routine w reflex microscopic -     amLODipine (NORVASC) 10 MG tablet; Take 1 tablet (10 mg total) by mouth daily. -     lisinopril (PRINIVIL,ZESTRIL) 10 MG tablet; Take 1 tablet (10 mg total) by mouth daily.  Type 2 diabetes mellitus with hyperglycemia, without long-term current use of insulin (HCC) -     CBC -     Comprehensive metabolic panel -     Microalbumin / creatinine urine ratio -     Urinalysis, Routine w reflex microscopic -     Discontinue: insulin glargine (LANTUS) 100 UNIT/ML injection; 20 Units tonight at bedtime. Measure fasting am glucose. Increase by 4U nightly until fasting sugar <150. Pharm instruct please. (Patient not taking: Reported on 07/10/2017) -     metFORMIN (GLUCOPHAGE) 500 MG tablet; Take 2 tablets (1,000 mg total) by mouth 2 (two) times daily with a  meal. -     glucose blood (BAYER CONTOUR TEST) test strip; Use as instructed -     Ambulatory referral to diabetic education -     POC HgB A1c -     lisinopril (PRINIVIL,ZESTRIL) 10 MG tablet; Take 1 tablet (10 mg total) by mouth daily. -     Insulin Glargine (BASAGLAR KWIKPEN) 100 UNIT/ML SOPN; 20U before bedtime and check fasting blood sugars daily. Increase by 4U nightly until fasting sugars are < 150.  Hypothyroidism, unspecified type -     TSH  Chronic gout without tophus, unspecified cause, unspecified site -  Uric acid -     allopurinol (ZYLOPRIM) 300 MG tablet; Take 1 tablet (300 mg total) by mouth daily.  Healthcare maintenance -     PSA  B12 deficiency -     B12   I have discontinued Ray Garner's metFORMIN, lisinopril, Potassium, hydrocortisone, lidocaine, Lidocaine, GLIPIZIDE XL, and insulin glargine. I am also having him start on metFORMIN, lisinopril, and BASAGLAR KWIKPEN. Additionally, I am having him maintain his vitamin B-12, allopurinol, amLODipine, and glucose blood.  Meds ordered this encounter  Medications  . DISCONTD: insulin glargine (LANTUS) 100 UNIT/ML injection    Sig: 20 Units tonight at bedtime. Measure fasting am glucose. Increase by 4U nightly until fasting sugar <150. Pharm instruct please.    Dispense:  10 mL    Refill:  11  . metFORMIN (GLUCOPHAGE) 500 MG tablet    Sig: Take 2 tablets (1,000 mg total) by mouth 2 (two) times daily with a meal.    Dispense:  180 tablet    Refill:  0  . allopurinol (ZYLOPRIM) 300 MG tablet    Sig: Take 1 tablet (300 mg total) by mouth daily.    Dispense:  90 tablet    Refill:  1  . amLODipine (NORVASC) 10 MG tablet    Sig: Take 1 tablet (10 mg total) by mouth daily.    Dispense:  90 tablet    Refill:  1  . glucose blood (BAYER CONTOUR TEST) test strip    Sig: Use as instructed    Dispense:  100 each    Refill:  12    E11.9 - non insulin dependent  . lisinopril (PRINIVIL,ZESTRIL) 10 MG tablet    Sig:  Take 1 tablet (10 mg total) by mouth daily.    Dispense:  90 tablet    Refill:  0  . Insulin Glargine (BASAGLAR KWIKPEN) 100 UNIT/ML SOPN    Sig: 20U before bedtime and check fasting blood sugars daily. Increase by 4U nightly until fasting sugars are < 150.    Dispense:  15 mL    Refill:  5   Patient's diabetes is been under poor control over the last 2 to 3 months at least.  Explained to him with a hemoglobin A1c over 12,  he will need insulin.  Spent 20 minutes discussing the use of Lantus.  He will start with 20 units tonight.  He will measure his fasting blood sugars daily and increase his Lantus dosage by 4 units nightly until he achieves a fasting blood sugar in the less than 150 range.  I have discontinued his glipizide.  We will maintain him on high dose metformin.  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  Follow-up: Return in about 2 weeks (around 07/22/2017).  Libby Maw, MD

## 2017-07-09 LAB — COMPREHENSIVE METABOLIC PANEL
ALBUMIN: 4.3 g/dL (ref 3.5–5.2)
ALT: 25 U/L (ref 0–53)
AST: 18 U/L (ref 0–37)
Alkaline Phosphatase: 90 U/L (ref 39–117)
BUN: 10 mg/dL (ref 6–23)
CALCIUM: 9.7 mg/dL (ref 8.4–10.5)
CHLORIDE: 99 meq/L (ref 96–112)
CO2: 26 meq/L (ref 19–32)
CREATININE: 0.99 mg/dL (ref 0.40–1.50)
GFR: 98.92 mL/min (ref >60.00)
Glucose, Bld: 320 mg/dL — ABNORMAL HIGH (ref 70–99)
Potassium: 4.4 mEq/L (ref 3.5–5.1)
Sodium: 136 mEq/L (ref 135–145)
Total Bilirubin: 0.4 mg/dL (ref 0.2–1.2)
Total Protein: 8 g/dL (ref 6.0–8.3)

## 2017-07-09 LAB — URINALYSIS, ROUTINE W REFLEX MICROSCOPIC
BILIRUBIN URINE: NEGATIVE
Hgb urine dipstick: NEGATIVE
Leukocytes, UA: NEGATIVE
Nitrite: NEGATIVE
PH: 5.5 (ref 5.0–8.0)
RBC / HPF: NONE SEEN (ref 0–?)
TOTAL PROTEIN, URINE-UPE24: 30 — AB
Urine Glucose: 500 — AB
Urobilinogen, UA: 0.2 (ref 0.0–1.0)

## 2017-07-09 LAB — CBC
HEMATOCRIT: 42.3 % (ref 39.0–52.0)
Hemoglobin: 13.9 g/dL (ref 13.0–17.0)
MCHC: 32.9 g/dL (ref 30.0–36.0)
MCV: 85.4 fl (ref 78.0–100.0)
PLATELETS: 302 10*3/uL (ref 150.0–400.0)
RBC: 4.95 Mil/uL (ref 4.22–5.81)
RDW: 13.9 % (ref 11.5–15.5)
WBC: 9.1 10*3/uL (ref 4.0–10.5)

## 2017-07-09 LAB — URIC ACID: URIC ACID, SERUM: 5 mg/dL (ref 4.0–7.8)

## 2017-07-09 LAB — VITAMIN B12: Vitamin B-12: 925 pg/mL — ABNORMAL HIGH (ref 211–911)

## 2017-07-09 LAB — PSA: PSA: 0.77 ng/mL (ref 0.10–4.00)

## 2017-07-09 LAB — TSH: TSH: 1.94 u[IU]/mL (ref 0.35–4.50)

## 2017-07-10 ENCOUNTER — Encounter: Payer: Self-pay | Admitting: Family Medicine

## 2017-07-10 ENCOUNTER — Ambulatory Visit: Payer: PRIVATE HEALTH INSURANCE | Attending: Family Medicine | Admitting: Family Medicine

## 2017-07-10 VITALS — BP 139/89 | HR 81 | Temp 97.6°F | Ht 72.0 in | Wt 270.0 lb

## 2017-07-10 DIAGNOSIS — I10 Essential (primary) hypertension: Secondary | ICD-10-CM | POA: Diagnosis not present

## 2017-07-10 DIAGNOSIS — R21 Rash and other nonspecific skin eruption: Secondary | ICD-10-CM | POA: Diagnosis not present

## 2017-07-10 DIAGNOSIS — Z79890 Hormone replacement therapy (postmenopausal): Secondary | ICD-10-CM | POA: Insufficient documentation

## 2017-07-10 DIAGNOSIS — E1100 Type 2 diabetes mellitus with hyperosmolarity without nonketotic hyperglycemic-hyperosmolar coma (NKHHC): Secondary | ICD-10-CM | POA: Insufficient documentation

## 2017-07-10 DIAGNOSIS — G4700 Insomnia, unspecified: Secondary | ICD-10-CM | POA: Insufficient documentation

## 2017-07-10 DIAGNOSIS — Z885 Allergy status to narcotic agent status: Secondary | ICD-10-CM | POA: Insufficient documentation

## 2017-07-10 DIAGNOSIS — G4709 Other insomnia: Secondary | ICD-10-CM | POA: Diagnosis not present

## 2017-07-10 DIAGNOSIS — E039 Hypothyroidism, unspecified: Secondary | ICD-10-CM | POA: Diagnosis not present

## 2017-07-10 DIAGNOSIS — R1013 Epigastric pain: Secondary | ICD-10-CM | POA: Insufficient documentation

## 2017-07-10 DIAGNOSIS — Z79899 Other long term (current) drug therapy: Secondary | ICD-10-CM | POA: Diagnosis not present

## 2017-07-10 DIAGNOSIS — M109 Gout, unspecified: Secondary | ICD-10-CM | POA: Diagnosis not present

## 2017-07-10 LAB — MICROALBUMIN / CREATININE URINE RATIO
CREATININE, U: 324.4 mg/dL
MICROALB UR: 12.5 mg/dL — AB (ref 0.0–1.9)
MICROALB/CREAT RATIO: 3.8 mg/g (ref 0.0–30.0)

## 2017-07-10 LAB — GLUCOSE, POCT (MANUAL RESULT ENTRY): POC GLUCOSE: 349 mg/dL — AB (ref 70–99)

## 2017-07-10 MED ORDER — LEVOTHYROXINE SODIUM 175 MCG PO TABS: ORAL_TABLET | ORAL | 1 refills | Status: DC

## 2017-07-10 MED ORDER — OMEPRAZOLE 20 MG PO CPDR: 20.0000 mg | DELAYED_RELEASE_CAPSULE | Freq: Every day | ORAL | 3 refills | Status: DC

## 2017-07-10 MED ORDER — TRAZODONE HCL 50 MG PO TABS: 50.0000 mg | ORAL_TABLET | Freq: Every evening | ORAL | 3 refills | Status: DC | PRN

## 2017-07-10 NOTE — Progress Notes (Signed)
Subjective:  Patient ID: Ray Garner, male    DOB: [birth year]  Age: 62 y.o. MRN: [id number]  CC: Diabetes   HPI Ray Garner is a 62 year old male with history of type 2 diabetes mellitus (A1c 12), hypertension, hypothyroidism, Gout who presents for a follow-up visit. 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  He was seen by Maryanna Shape primary care yesterday when he was unable to obtain an appointment here in his medications were changed.  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 He has not been taking the Lantus but rather taking metformin and glipizide and his blood sugar in the clinic is 349.  Denies numbness in extremities, visual concerns, hypoglycemia. He is requesting a prescription for freestyle libre glucometer. He denies gout flares and has been compliant with his antihypertensive and levothyroxine which she takes for hypothyroidism. He complains of a pruritic rash on the back of his neck and also complains of intermittent epigastric pain but no nausea, vomiting, diarrhea constipation.  Past Medical History:  Diagnosis Date  . Arthritis   . Back pain, lumbosacral 06/30/2013  . DM type 2 (diabetes mellitus, type 2) ([place]) 06/30/2013  . Gout   . HTN (hypertension) 06/30/2013  . Hypertension   . Hypothyroid 06/30/2013    Past Surgical History:  Procedure Laterality Date  . CYST EXCISION    . FRACTURE SURGERY      Allergies  Allergen Reactions  . Vicodin [Hydrocodone-Acetaminophen] Hives and Rash     Outpatient Medications Prior to Visit  Medication Sig Dispense Refill  . allopurinol (ZYLOPRIM) 300 MG tablet Take 1 tablet (300 mg total) by mouth daily. 90 tablet 1  . amLODipine (NORVASC) 10 MG  tablet Take 1 tablet (10 mg total) by mouth daily. 90 tablet 1  . glucose blood (BAYER CONTOUR TEST) test strip Use as instructed 100 each 12  . lisinopril (PRINIVIL,ZESTRIL) 10 MG tablet Take 1 tablet (10 mg total) by mouth daily. 90 tablet 0  . metFORMIN (GLUCOPHAGE) 500 MG tablet Take 2 tablets (1,000 mg total) by mouth 2 (two) times daily with a meal. 180 tablet 0  . vitamin B-12 (CYANOCOBALAMIN) 1000 MCG tablet Take 1,000 mcg by mouth daily.    Marland Kitchen levothyroxine (SYNTHROID, LEVOTHROID) 175 MCG tablet TAKE 1 TABLET BY MOUTH ONCE DAILY BEFORE BREAKFAST 90 tablet 1  . insulin glargine (LANTUS) 100 UNIT/ML injection 20 Units tonight at bedtime. Measure fasting am glucose. Increase by 4U nightly until fasting sugar <150. Pharm instruct please. (Patient not taking: Reported on 07/10/2017) 10 mL 11   No facility-administered medications prior to visit.     ROS Review of Systems  Constitutional: Negative for activity change and appetite change.  HENT: Negative for sinus pressure and sore throat.   Eyes: Negative for visual disturbance.  Respiratory: Negative for cough, chest tightness and shortness of breath.   Cardiovascular: Negative for chest pain and leg swelling.  Gastrointestinal: Positive for abdominal pain. Negative for abdominal distention, constipation and diarrhea.  Endocrine: Negative.   Genitourinary: Negative for dysuria.  Musculoskeletal: Negative for joint swelling and myalgias.  Skin: Positive for rash.  Allergic/Immunologic: Negative.   Neurological:  Negative for weakness, light-headedness and numbness.  Psychiatric/Behavioral: Negative for dysphoric mood and suicidal ideas.    Objective:  BP 139/89   Pulse 81   Temp 97.6 F (36.4 C) (Oral)   Ht 6' (1.829 m)   Wt 270 lb (122.5 kg)   SpO2 98%   BMI 36.62 kg/m   BP/Weight 07/10/2017 07/08/2017 44/04/4740  Systolic BP 595 638 756  Diastolic BP 89 90 90  Wt. (Lbs) 270 266 264  BMI 36.62 36.08 35.8      Physical  Exam  Constitutional: He is oriented to person, place, and time. He appears well-developed and well-nourished.  Cardiovascular: Normal rate, normal heart sounds and intact distal pulses.  No murmur heard. Pulmonary/Chest: Effort normal and breath sounds normal. He has no wheezes. He has no rales. He exhibits no tenderness.  Abdominal: Soft. Bowel sounds are normal. He exhibits no distension and no mass. There is no tenderness.  Musculoskeletal: Normal range of motion.  Neurological: He is alert and oriented to person, place, and time.  Skin:  Two nodules on posterior neck with scabs  Psychiatric: He has a normal mood and affect.     CMP Latest Ref Rng & Units 07/08/2017 12/19/2016 04/11/2016  Glucose 70 - 99 mg/dL 320(H) 269(H) 115(H)  BUN 6 - 23 mg/dL 10 11 10   Creatinine 0.40 - 1.50 mg/dL 0.99 0.85 0.89  Sodium 135 - 145 mEq/L 136 137 140  Potassium 3.5 - 5.1 mEq/L 4.4 3.8 4.2  Chloride 96 - 112 mEq/L 99 103 103  CO2 19 - 32 mEq/L 26 22 25   Calcium 8.4 - 10.5 mg/dL 9.7 9.4 9.8  Total Protein 6.0 - 8.3 g/dL 8.0 - 7.4  Total Bilirubin 0.2 - 1.2 mg/dL 0.4 - 0.3  Alkaline Phos 39 - 117 U/L 90 - 86  AST 0 - 37 U/L 18 - 20  ALT 0 - 53 U/L 25 - 25    Lipid Panel     Component Value Date/Time   CHOL 134 08/08/2015 0845   TRIG 168 (H) 08/08/2015 0845   HDL 37 (L) 08/08/2015 0845   CHOLHDL 3.6 08/08/2015 0845   VLDL 34 (H) 08/08/2015 0845   LDLCALC 63 08/08/2015 0845    Lab Results  Component Value Date   HGBA1C 12 07/08/2017    Assessment & Plan:   1. Type 2 diabetes mellitus with hyperosmolarity without coma, without long-term current use of insulin (HCC) Uncontrolled with A1c of 12 Recently commenced on Lantus which I have encouraged him to comply with along with metformin Glipizide was discontinued and I have advised him to hold off on glipizide Comply with a diabetic diet, exercise - POCT glucose (manual entry)  2. Hypothyroidism, unspecified type Controlled -  levothyroxine (SYNTHROID, LEVOTHROID) 175 MCG tablet; TAKE 1 TABLET BY MOUTH ONCE DAILY BEFORE BREAKFAST  Dispense: 90 tablet; Refill: 1  3. Epigastric pain We will commence PPI for possible gastritis - omeprazole (PRILOSEC) 20 MG capsule; Take 1 capsule (20 mg total) by mouth daily.  Dispense: 30 capsule; Refill: 3  4. Rash Advised to apply hydrocortisone cream to help with itching  5. Other insomnia Sleep hygiene discussed Commenced on trazodone - traZODone (DESYREL) 50 MG tablet; Take 1 tablet (50 mg total) by mouth at bedtime as needed for sleep.  Dispense: 30 tablet; Refill: 3   Meds ordered this encounter  Medications  . levothyroxine (SYNTHROID, LEVOTHROID) 175 MCG tablet    Sig: TAKE 1 TABLET BY MOUTH ONCE DAILY BEFORE  BREAKFAST    Dispense:  90 tablet    Refill:  1  . traZODone (DESYREL) 50 MG tablet    Sig: Take 1 tablet (50 mg total) by mouth at bedtime as needed for sleep.    Dispense:  30 tablet    Refill:  3  . omeprazole (PRILOSEC) 20 MG capsule    Sig: Take 1 capsule (20 mg total) by mouth daily.    Dispense:  30 capsule    Refill:  3    Follow-up: Return in about 3 months (around 10/10/2017) for follow up of diabetes mellitus.   Charlott Rakes MD

## 2017-07-10 NOTE — Patient Instructions (Signed)
Diabetes Mellitus and Nutrition When you have diabetes (diabetes mellitus), it is very important to have healthy eating habits because your blood sugar (glucose) levels are greatly affected by what you eat and drink. Eating healthy foods in the appropriate amounts, at about the same times every day, can help you:  Control your blood glucose.  Lower your risk of heart disease.  Improve your blood pressure.  Reach or maintain a healthy weight.  Every person with diabetes is different, and each person has different needs for a meal plan. Your health care provider may recommend that you work with a diet and nutrition specialist (dietitian) to make a meal plan that is best for you. Your meal plan may vary depending on factors such as:  The calories you need.  The medicines you take.  Your weight.  Your blood glucose, blood pressure, and cholesterol levels.  Your activity level.  Other health conditions you have, such as heart or kidney disease.  How do carbohydrates affect me? Carbohydrates affect your blood glucose level more than any other type of food. Eating carbohydrates naturally increases the amount of glucose in your blood. Carbohydrate counting is a method for keeping track of how many carbohydrates you eat. Counting carbohydrates is important to keep your blood glucose at a healthy level, especially if you use insulin or take certain oral diabetes medicines. It is important to know how many carbohydrates you can safely have in each meal. This is different for every person. Your dietitian can help you calculate how many carbohydrates you should have at each meal and for snack. Foods that contain carbohydrates include:  Bread, cereal, rice, pasta, and crackers.  Potatoes and corn.  Peas, beans, and lentils.  Milk and yogurt.  Fruit and juice.  Desserts, such as cakes, cookies, ice cream, and candy.  How does alcohol affect me? Alcohol can cause a sudden decrease in blood  glucose (hypoglycemia), especially if you use insulin or take certain oral diabetes medicines. Hypoglycemia can be a life-threatening condition. Symptoms of hypoglycemia (sleepiness, dizziness, and confusion) are similar to symptoms of having too much alcohol. If your health care provider says that alcohol is safe for you, follow these guidelines:  Limit alcohol intake to no more than 1 drink per day for nonpregnant women and 2 drinks per day for men. One drink equals 12 oz of beer, 5 oz of wine, or 1 oz of hard liquor.  Do not drink on an empty stomach.  Keep yourself hydrated with water, diet soda, or unsweetened iced tea.  Keep in mind that regular soda, juice, and other mixers may contain a lot of sugar and must be counted as carbohydrates.  What are tips for following this plan? Reading food labels  Start by checking the serving size on the label. The amount of calories, carbohydrates, fats, and other nutrients listed on the label are based on one serving of the food. Many foods contain more than one serving per package.  Check the total grams (g) of carbohydrates in one serving. You can calculate the number of servings of carbohydrates in one serving by dividing the total carbohydrates by 15. For example, if a food has 30 g of total carbohydrates, it would be equal to 2 servings of carbohydrates.  Check the number of grams (g) of saturated and trans fats in one serving. Choose foods that have low or no amount of these fats.  Check the number of milligrams (mg) of sodium in one serving. Most people   should limit total sodium intake to less than 2,300 mg per day.  Always check the nutrition information of foods labeled as "low-fat" or "nonfat". These foods may be higher in added sugar or refined carbohydrates and should be avoided.  Talk to your dietitian to identify your daily goals for nutrients listed on the label. Shopping  Avoid buying canned, premade, or processed foods. These  foods tend to be high in fat, sodium, and added sugar.  Shop around the outside edge of the grocery store. This includes fresh fruits and vegetables, bulk grains, fresh meats, and fresh dairy. Cooking  Use low-heat cooking methods, such as baking, instead of high-heat cooking methods like deep frying.  Cook using healthy oils, such as olive, canola, or sunflower oil.  Avoid cooking with butter, cream, or high-fat meats. Meal planning  Eat meals and snacks regularly, preferably at the same times every day. Avoid going long periods of time without eating.  Eat foods high in fiber, such as fresh fruits, vegetables, beans, and whole grains. Talk to your dietitian about how many servings of carbohydrates you can eat at each meal.  Eat 4-6 ounces of lean protein each day, such as lean meat, chicken, fish, eggs, or tofu. 1 ounce is equal to 1 ounce of meat, chicken, or fish, 1 egg, or 1/4 cup of tofu.  Eat some foods each day that contain healthy fats, such as avocado, nuts, seeds, and fish. Lifestyle   Check your blood glucose regularly.  Exercise at least 30 minutes 5 or more days each week, or as told by your health care provider.  Take medicines as told by your health care provider.  Do not use any products that contain nicotine or tobacco, such as cigarettes and e-cigarettes. If you need help quitting, ask your health care provider.  Work with a counselor or diabetes educator to identify strategies to manage stress and any emotional and social challenges. What are some questions to ask my health care provider?  Do I need to meet with a diabetes educator?  Do I need to meet with a dietitian?  What number can I call if I have questions?  When are the best times to check my blood glucose? Where to find more information:  American Diabetes Association: diabetes.org/food-and-fitness/food  Academy of Nutrition and Dietetics:  www.eatright.org/resources/health/diseases-and-conditions/diabetes  National Institute of Diabetes and Digestive and Kidney Diseases (NIH): www.niddk.nih.gov/health-information/diabetes/overview/diet-eating-physical-activity Summary  A healthy meal plan will help you control your blood glucose and maintain a healthy lifestyle.  Working with a diet and nutrition specialist (dietitian) can help you make a meal plan that is best for you.  Keep in mind that carbohydrates and alcohol have immediate effects on your blood glucose levels. It is important to count carbohydrates and to use alcohol carefully. This information is not intended to replace advice given to you by your health care provider. Make sure you discuss any questions you have with your health care provider. Document Released: 11/09/2004 Document Revised: 03/19/2016 Document Reviewed: 03/19/2016 Elsevier Interactive Patient Education  2018 Elsevier Inc.  

## 2017-07-11 ENCOUNTER — Telehealth: Payer: Self-pay

## 2017-07-11 ENCOUNTER — Other Ambulatory Visit: Payer: Self-pay | Admitting: Family Medicine

## 2017-07-11 MED ORDER — BASAGLAR KWIKPEN 100 UNIT/ML ~~LOC~~ SOPN: PEN_INJECTOR | SUBCUTANEOUS | 5 refills | Status: DC

## 2017-07-11 MED ORDER — FREESTYLE LIBRE 14 DAY READER DEVI: 1.0000 | Freq: Three times a day (TID) | 0 refills | Status: AC

## 2017-07-11 MED ORDER — FREESTYLE LIBRE 14 DAY SENSOR MISC: 1.0000 | Freq: Three times a day (TID) | 11 refills | Status: DC

## 2017-07-11 NOTE — Addendum Note (Signed)
Addended by: Jon Billings on: 07/11/2017 09:37 AM   Modules accepted: Orders

## 2017-07-11 NOTE — Telephone Encounter (Deleted)
The medications listed on patient's formulary are:  glimepiride glipizide glipizide ER glipizide XL glyburide metformin metformin ERPA QL pioglitazone FARXIGAQL GLYXAMBIQL JANUMETQL JANUMET XRQL JANUVIAQL JARDIANCEQL JENTADUETOQL JENTADUETO XRQL OZEMPICPA QL SEGLUROMETQL STEGLATROQL STEGLUJANQL SYNARDYQL TRADJENTAQL TRULICITYPA QL XIGDUO XRQL XULTOPHYPA QL VICTOZAPA QL   There are other insulins on his formulary, but Lantus is also on the formulary & over $300.

## 2017-07-11 NOTE — Telephone Encounter (Signed)
I called and made patient aware that a new Rx has been sent in. I also let him know that I faxed a savings card from the manufacturer to the pharmacy as well. Patient will wait for pharmacy's call.

## 2017-07-11 NOTE — Telephone Encounter (Signed)
Patient said that he went to pick up the Lantus and it was $359. Is there another alternative we can send in for him?

## 2017-07-11 NOTE — Telephone Encounter (Signed)
Sent Rx for basaglar to pharm.

## 2017-07-23 ENCOUNTER — Ambulatory Visit: Payer: PRIVATE HEALTH INSURANCE | Admitting: Family Medicine

## 2017-07-26 ENCOUNTER — Encounter: Payer: PRIVATE HEALTH INSURANCE | Attending: Family Medicine | Admitting: Dietician

## 2017-07-26 ENCOUNTER — Encounter: Payer: Self-pay | Admitting: Dietician

## 2017-07-26 DIAGNOSIS — Z713 Dietary counseling and surveillance: Secondary | ICD-10-CM | POA: Diagnosis not present

## 2017-07-26 DIAGNOSIS — E1165 Type 2 diabetes mellitus with hyperglycemia: Secondary | ICD-10-CM | POA: Diagnosis not present

## 2017-07-26 NOTE — Progress Notes (Signed)
Diabetes Self-Management Education  Visit Type: First/Initial  Appt. Start Time: 1030 Appt. End Time: 1200  07/26/2017  Mr. Ray Garner, identified by name and date of birth, is a 62 y.o. male with a diagnosis of Diabetes: Type 2. Other history includes hypothyroid, gout, and HTN.  UBW 272 lbs.  He has slowly lost weight.  He would like to lose 20-30 lbs.  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  He has a small refrigerator and a rice cooker in his truck.  He often prepares meals.  His next DOT exam is in November.  His A1C in May was 12%.  He has been started on insulin.  He reports forgetting his Metformin at times.  Several months ago he ran out of his medications while on the road and was without them about 1 month.  Discussed that prescriptions can temporarily be transferred to another pharmacy out of town.  He wears a free style Elenor Legato and really likes this but states that insurance is not covering this.  Medications include: Vitamin B-12, Basaglar 24 units at 6-7 pm, Metformin bid  ASSESSMENT  Height 6' (1.829 m), weight 266 lb (120.7 kg). Body mass index is 36.08 kg/m.  Diabetes Self-Management Education - 07/26/17 1048      Visit Information   Visit Type  First/Initial      Initial Visit   Diabetes Type  Type 2    Are you currently following a meal plan?  No    Are you taking your medications as prescribed?  Yes    Date Diagnosed  2012      Health Coping   How would you rate your overall health?  Fair      Psychosocial Assessment   Patient Belief/Attitude about Diabetes  Motivated to manage diabetes    Self-care barriers  Other (comment) Long distance truck driver    Self-management support  [place] office    Other persons present  Patient    Patient Concerns  Nutrition/Meal planning;Glycemic Control;Weight Control;Problem Solving    Special Needs  None    Preferred Learning Style  No preference indicated    Learning  Readiness  Ready    How often do you need to have someone help you when you read instructions, pamphlets, or other written materials from your doctor or pharmacy?  1 - Never    What is the last grade level you completed in school?  11th grade      Pre-Education Assessment   Patient understands the diabetes disease and treatment process.  Needs Review    Patient understands incorporating nutritional management into lifestyle.  Needs Review    Patient undertands incorporating physical activity into lifestyle.  Needs Review    Patient understands using medications safely.  Needs Review    Patient understands monitoring blood glucose, interpreting and using results  Needs Review    Patient understands prevention, detection, and treatment of acute complications.  Needs Review    Patient understands prevention, detection, and treatment of chronic complications.  Needs Review    Patient understands how to develop strategies to address psychosocial issues.  Needs Review    Patient understands how to develop strategies to promote health/change behavior.  Needs Review      Complications   Last HgB A1C per patient/outside source  12 % 07/08/17    How often do you check your blood sugar?  > 4 times/day FreeStyle Elenor Legato  Fasting Blood glucose range (mg/dL)  130-179 135 this am    Postprandial Blood glucose range (mg/dL)  >200;180-200    Number of hypoglycemic episodes per month  0    Number of hyperglycemic episodes per week  7    Can you tell when your blood sugar is high?  Yes    What do you do if your blood sugar is high?  drink more water    Have you had a dilated eye exam in the past 12 months?  No    Have you had a dental exam in the past 12 months?  No    Are you checking your feet?  Yes    How many days per week are you checking your feet?  4      Dietary Intake   Breakfast  sausage and egg biscuit    Snack (morning)  nabs or banana    Lunch  chicken wings, mixed vegetables, garlic (cooks  in a rice cooker when he drives)- used to use Ramen Noodles OR rotissarie chicken, 2 vegetables    Snack (afternoon)  sunflower seeds or pistacios, fresh fruit    Dinner  1/2 T-bone, vegetables    Snack (evening)  none    Beverage(s)  lemon water      Exercise   Exercise Type  Light (walking / raking leaves)    How many days per week to you exercise?  7    How many minutes per day do you exercise?  30    Total minutes per week of exercise  210      Patient Education   Previous Diabetes Education  No    Disease state   Definition of diabetes, type 1 and 2, and the diagnosis of diabetes    Nutrition management   Role of diet in the treatment of diabetes and the relationship between the three main macronutrients and blood glucose level;Food label reading, portion sizes and measuring food.;Meal options for control of blood glucose level and chronic complications.;Information on hints to eating out and maintain blood glucose control.;Meal timing in regards to the patients' current diabetes medication.    Physical activity and exercise   Role of exercise on diabetes management, blood pressure control and cardiac health.    Medications  Reviewed patients medication for diabetes, action, purpose, timing of dose and side effects.    Monitoring  Purpose and frequency of SMBG.;Daily foot exams;Identified appropriate SMBG and/or A1C goals.;Yearly dilated eye exam    Acute complications  Taught treatment of hypoglycemia - the 15 rule.    Chronic complications  Relationship between chronic complications and blood glucose control    Psychosocial adjustment  Role of stress on diabetes    Personal strategies to promote health  Lifestyle issues that need to be addressed for better diabetes care      Individualized Goals (developed by patient)   Nutrition  General guidelines for healthy choices and portions discussed    Physical Activity  Exercise 5-7 days per week;60 minutes per day    Medications  take my  medication as prescribed    Monitoring   test my blood glucose as discussed    Problem Solving  medication and meal timing    Reducing Risk  examine blood glucose patterns;increase portions of healthy fats    Health Coping  discuss diabetes with (comment) MD, RD, CDE      Post-Education Assessment   Patient understands the diabetes disease and treatment process.  Demonstrates  understanding / competency    Patient understands incorporating nutritional management into lifestyle.  Demonstrates understanding / competency    Patient undertands incorporating physical activity into lifestyle.  Demonstrates understanding / competency    Patient understands using medications safely.  Demonstrates understanding / competency    Patient understands monitoring blood glucose, interpreting and using results  Demonstrates understanding / competency    Patient understands prevention, detection, and treatment of acute complications.  Demonstrates understanding / competency    Patient understands prevention, detection, and treatment of chronic complications.  Demonstrates understanding / competency    Patient understands how to develop strategies to address psychosocial issues.  Demonstrates understanding / competency    Patient understands how to develop strategies to promote health/change behavior.  Demonstrates understanding / competency      Outcomes   Expected Outcomes  Demonstrated interest in learning. Expect positive outcomes    Future DMSE  PRN    Program Status  Completed       Individualized Plan for Diabetes Self-Management Training:   Learning Objective:  Patient will have a greater understanding of diabetes self-management. Patient education plan is to attend individual and/or group sessions per assessed needs and concerns.   Plan:   Patient Instructions  Stay active every day.  Aim for 30-60 minutes.  Consider downloading a pedometer app on your phone. Take your medications as  prescribed.  Metformin should be taken with food.  Take the Basaglar around the same time each day. Rotate your insulin sites. Continue to use the [place] daily. Choose lean meat.  Remove the skin from the chicken. 1/2 your meal should be non-starchy vegetables.  Small amount of protein with each meal and snack.   Choose beverages without sugar.    Expected Outcomes:  Demonstrated interest in learning. Expect positive outcomes  Education material provided: ADA Diabetes: Your Take Control Guide, Food label handouts, A1C conversion sheet, Meal plan card, My Plate and Snack sheet  If problems or questions, patient to contact team via:  Phone  Future DSME appointment: PRN

## 2017-07-26 NOTE — Patient Instructions (Addendum)
Stay active every day.  Aim for 30-60 minutes.  Consider downloading a pedometer app on your phone. Take your medications as prescribed.  Metformin should be taken with food.  Take the Basaglar around the same time each day. Rotate your insulin sites. Continue to use the [place] daily. Choose lean meat.  Remove the skin from the chicken. 1/2 your meal should be non-starchy vegetables.  Small amount of protein with each meal and snack.   Choose beverages without sugar.  Provided note explaining how to compare blood sugar readings before and after meals.

## 2017-09-30 ENCOUNTER — Other Ambulatory Visit: Payer: Self-pay

## 2017-09-30 ENCOUNTER — Other Ambulatory Visit: Payer: Self-pay | Admitting: Family Medicine

## 2017-09-30 DIAGNOSIS — E1165 Type 2 diabetes mellitus with hyperglycemia: Secondary | ICD-10-CM

## 2017-09-30 MED ORDER — METFORMIN HCL 500 MG PO TABS: 1000.0000 mg | ORAL_TABLET | Freq: Two times a day (BID) | ORAL | 0 refills | Status: DC

## 2017-10-15 ENCOUNTER — Ambulatory Visit: Payer: PRIVATE HEALTH INSURANCE | Attending: Family Medicine | Admitting: Family Medicine

## 2017-10-15 ENCOUNTER — Encounter: Payer: Self-pay | Admitting: Family Medicine

## 2017-10-15 VITALS — BP 146/79 | HR 71 | Temp 98.1°F | Ht 72.0 in | Wt 261.2 lb

## 2017-10-15 DIAGNOSIS — G4700 Insomnia, unspecified: Secondary | ICD-10-CM | POA: Insufficient documentation

## 2017-10-15 DIAGNOSIS — E1165 Type 2 diabetes mellitus with hyperglycemia: Secondary | ICD-10-CM | POA: Diagnosis not present

## 2017-10-15 DIAGNOSIS — G4709 Other insomnia: Secondary | ICD-10-CM | POA: Diagnosis not present

## 2017-10-15 DIAGNOSIS — Z79899 Other long term (current) drug therapy: Secondary | ICD-10-CM | POA: Insufficient documentation

## 2017-10-15 DIAGNOSIS — K219 Gastro-esophageal reflux disease without esophagitis: Secondary | ICD-10-CM | POA: Diagnosis not present

## 2017-10-15 DIAGNOSIS — Z79890 Hormone replacement therapy (postmenopausal): Secondary | ICD-10-CM | POA: Insufficient documentation

## 2017-10-15 DIAGNOSIS — Z794 Long term (current) use of insulin: Secondary | ICD-10-CM | POA: Insufficient documentation

## 2017-10-15 DIAGNOSIS — I10 Essential (primary) hypertension: Secondary | ICD-10-CM | POA: Diagnosis not present

## 2017-10-15 DIAGNOSIS — E039 Hypothyroidism, unspecified: Secondary | ICD-10-CM | POA: Insufficient documentation

## 2017-10-15 DIAGNOSIS — M1A9XX0 Chronic gout, unspecified, without tophus (tophi): Secondary | ICD-10-CM | POA: Insufficient documentation

## 2017-10-15 DIAGNOSIS — Z885 Allergy status to narcotic agent status: Secondary | ICD-10-CM | POA: Insufficient documentation

## 2017-10-15 LAB — GLUCOSE, POCT (MANUAL RESULT ENTRY): POC Glucose: 127 mg/dl — AB (ref 70–99)

## 2017-10-15 LAB — POCT GLYCOSYLATED HEMOGLOBIN (HGB A1C): HBA1C, POC (CONTROLLED DIABETIC RANGE): 8.5 % — AB (ref 0.0–7.0)

## 2017-10-15 MED ORDER — ALLOPURINOL 300 MG PO TABS: 300.0000 mg | ORAL_TABLET | Freq: Every day | ORAL | 1 refills | Status: DC

## 2017-10-15 MED ORDER — INSULIN GLARGINE 100 UNIT/ML SOLOSTAR PEN: 24.0000 [IU] | PEN_INJECTOR | Freq: Every day | SUBCUTANEOUS | 5 refills | Status: DC

## 2017-10-15 MED ORDER — OMEPRAZOLE 20 MG PO CPDR: 20.0000 mg | DELAYED_RELEASE_CAPSULE | Freq: Every day | ORAL | 1 refills | Status: DC

## 2017-10-15 MED ORDER — LISINOPRIL 10 MG PO TABS: 10.0000 mg | ORAL_TABLET | Freq: Every day | ORAL | 1 refills | Status: DC

## 2017-10-15 MED ORDER — TRAZODONE HCL 50 MG PO TABS: 50.0000 mg | ORAL_TABLET | Freq: Every evening | ORAL | 3 refills | Status: DC | PRN

## 2017-10-15 MED ORDER — INSULIN PEN NEEDLE 31G X 5 MM MISC: 1.0000 | Freq: Every day | 5 refills | Status: DC

## 2017-10-15 MED ORDER — AMLODIPINE BESYLATE 10 MG PO TABS: 10.0000 mg | ORAL_TABLET | Freq: Every day | ORAL | 1 refills | Status: DC

## 2017-10-15 MED FILL — !LANTUS SOLOSTAR 100UNITS/M: 100 | 25 days supply | Qty: 6 | Fill #0

## 2017-10-15 NOTE — Progress Notes (Signed)
Subjective:  Patient ID: Ray Garner, male    DOB: 17-Jan-1956  Age: 62 y.o. MRN: [id number]  CC: Diabetes   HPI Ray Garner is a 62 year old male with history of type 2 diabetes mellitus (A1c 8.5), hypertension, hypothyroidism, Gout who presents for a follow-up visit. His A1c is 8.5 which has trended down from 12.0 previously and he endorses compliance with Basaglar however he takes anywhere from 20 units to 24 units of Basaglar depending on what he eats at bedtime he states.  Denies hypoglycemic episodes, numbness in extremities, visual concerns.  He complains of the cost of Basaglar which is about $345 every 75 days and is wondering if there are other options to this. He has had no recent gout flares and is compliant with his antihypertensive and levothyroxine which he takes for hypothyroidism Currently drives trucks for a living.  Past Medical History:  Diagnosis Date  . Arthritis   . Back pain, lumbosacral 06/30/2013  . DM type 2 (diabetes mellitus, type 2) ([place]) 06/30/2013  . Gout   . HTN (hypertension) 06/30/2013  . Hypertension   . Hypothyroid 06/30/2013    Past Surgical History:  Procedure Laterality Date  . CYST EXCISION    . FRACTURE SURGERY      Allergies  Allergen Reactions  . Vicodin [Hydrocodone-Acetaminophen] Hives and Rash     Outpatient Medications Prior to Visit  Medication Sig Dispense Refill  . Continuous Blood Gluc Receiver (FREESTYLE LIBRE 14 DAY READER) DEVI 1 each by Does not apply route 3 (three) times daily with meals. 1 Device 0  . Continuous Blood Gluc Sensor (FREESTYLE LIBRE 14 DAY SENSOR) MISC 1 each by Does not apply route 3 (three) times daily with meals. 1 each 11  . glucose blood (BAYER CONTOUR TEST) test strip Use as instructed 100 each 12  . levothyroxine (SYNTHROID, LEVOTHROID) 175 MCG tablet TAKE 1 TABLET BY MOUTH ONCE DAILY BEFORE BREAKFAST 90 tablet 1  . metFORMIN (GLUCOPHAGE) 500 MG tablet Take 2 tablets (1,000 mg total) by mouth 2 (two)  times daily with a meal. 180 tablet 0  . vitamin B-12 (CYANOCOBALAMIN) 1000 MCG tablet Take 1,000 mcg by mouth daily.    Marland Kitchen allopurinol (ZYLOPRIM) 300 MG tablet Take 1 tablet (300 mg total) by mouth daily. 90 tablet 1  . amLODipine (NORVASC) 10 MG tablet Take 1 tablet (10 mg total) by mouth daily. 90 tablet 1  . Insulin Glargine (BASAGLAR KWIKPEN) 100 UNIT/ML SOPN 20U before bedtime and check fasting blood sugars daily. Increase by 4U nightly until fasting sugars are < 150. 15 mL 5  . lisinopril (PRINIVIL,ZESTRIL) 10 MG tablet Take 1 tablet (10 mg total) by mouth daily. 90 tablet 0  . metFORMIN (GLUCOPHAGE) 500 MG tablet TAKE 2 TABLETS BY MOUTH TWICE DAILY WITH MEALS 180 tablet 0  . omeprazole (PRILOSEC) 20 MG capsule Take 1 capsule (20 mg total) by mouth daily. 30 capsule 3  . traZODone (DESYREL) 50 MG tablet Take 1 tablet (50 mg total) by mouth at bedtime as needed for sleep. 30 tablet 3   No facility-administered medications prior to visit.     ROS Review of Systems  Constitutional: Negative for activity change and appetite change.  HENT: Negative for sinus pressure and sore throat.   Eyes: Negative for visual disturbance.  Respiratory: Negative for cough, chest tightness and shortness of breath.   Cardiovascular: Negative for chest pain and leg swelling.  Gastrointestinal: Negative for abdominal distention, abdominal pain, constipation and diarrhea.  Endocrine:  Negative.   Genitourinary: Negative for dysuria.  Musculoskeletal: Negative for joint swelling and myalgias.  Skin: Negative for rash.  Allergic/Immunologic: Negative.   Neurological: Negative for weakness, light-headedness and numbness.  Psychiatric/Behavioral: Negative for dysphoric mood and suicidal ideas.    Objective:  BP (!) 146/79   Pulse 71   Temp 98.1 F (36.7 C) (Oral)   Ht 6' (1.829 m)   Wt 261 lb 3.2 oz (118.5 kg)   SpO2 99%   BMI 35.43 kg/m   BP/Weight 10/15/2017 07/26/2017 9/72/8206  Systolic BP 015 -  615  Diastolic BP 79 - 89  Wt. (Lbs) 261.2 266 270  BMI 35.43 36.08 36.62      Physical Exam  Constitutional: He is oriented to person, place, and time. He appears well-developed and well-nourished.  Cardiovascular: Normal rate, normal heart sounds and intact distal pulses.  No murmur heard. Pulmonary/Chest: Effort normal and breath sounds normal. He has no wheezes. He has no rales. He exhibits no tenderness.  Abdominal: Soft. Bowel sounds are normal. He exhibits no distension and no mass. There is no tenderness.  Musculoskeletal: Normal range of motion.  Neurological: He is alert and oriented to person, place, and time.  Skin: Skin is warm and dry.  Psychiatric: He has a normal mood and affect.      CMP Latest Ref Rng & Units 07/08/2017 12/19/2016 04/11/2016  Glucose 70 - 99 mg/dL 320(H) 269(H) 115(H)  BUN 6 - 23 mg/dL 10 11 10   Creatinine 0.40 - 1.50 mg/dL 0.99 0.85 0.89  Sodium 135 - 145 mEq/L 136 137 140  Potassium 3.5 - 5.1 mEq/L 4.4 3.8 4.2  Chloride 96 - 112 mEq/L 99 103 103  CO2 19 - 32 mEq/L 26 22 25   Calcium 8.4 - 10.5 mg/dL 9.7 9.4 9.8  Total Protein 6.0 - 8.3 g/dL 8.0 - 7.4  Total Bilirubin 0.2 - 1.2 mg/dL 0.4 - 0.3  Alkaline Phos 39 - 117 U/L 90 - 86  AST 0 - 37 U/L 18 - 20  ALT 0 - 53 U/L 25 - 25    Lipid Panel     Component Value Date/Time   CHOL 134 08/08/2015 0845   TRIG 168 (H) 08/08/2015 0845   HDL 37 (L) 08/08/2015 0845   CHOLHDL 3.6 08/08/2015 0845   VLDL 34 (H) 08/08/2015 0845   LDLCALC 63 08/08/2015 0845    Lab Results  Component Value Date   HGBA1C 8.5 (A) 10/15/2017    Assessment & Plan:   1. Type 2 diabetes mellitus with hyperglycemia, without long-term current use of insulin (HCC) Uncontrolled with A1c of 8.5 which has improved significantly from 12 Advised against switching between 24 and 20 units of Lantus but to stick with 24 units I have spoken with the pharmacy will assist with application for Lantus via the patient assistance  program. Counseled on Diabetic diet, my plate method, 379 minutes of moderate intensity exercise/week Keep blood sugar logs with fasting goals of 80-120 mg/dl, random of less than 180 and in the event of sugars less than 60 mg/dl or greater than 400 mg/dl please notify the clinic ASAP. It is recommended that you undergo annual eye exams and annual foot exams. Pneumonia vaccine is recommended. - POCT glucose (manual entry) - POCT glycosylated hemoglobin (Hb A1C) - Lipid panel; Future - CMP14+EGFR; Future - lisinopril (PRINIVIL,ZESTRIL) 10 MG tablet; Take 1 tablet (10 mg total) by mouth daily.  Dispense: 90 tablet; Refill: 1 - Insulin Glargine (LANTUS SOLOSTAR) 100  UNIT/ML Solostar Pen; Inject 24 Units into the skin daily.  Dispense: 5 pen; Refill: 5 - Insulin Pen Needle 31G X 5 MM MISC; 1 each by Does not apply route at bedtime.  Dispense: 30 each; Refill: 5  2. Other insomnia Stable - traZODone (DESYREL) 50 MG tablet; Take 1 tablet (50 mg total) by mouth at bedtime as needed for sleep.  Dispense: 30 tablet; Refill: 3  3. Gastroesophageal reflux disease without esophagitis Controlled - omeprazole (PRILOSEC) 20 MG capsule; Take 1 capsule (20 mg total) by mouth daily.  Dispense: 90 capsule; Refill: 1  4. Essential hypertension Slightly elevated No regimen change today Counseled on Diabetic diet, my plate method, 672 minutes of moderate intensity exercise/week Keep blood sugar logs with fasting goals of 80-120 mg/dl, random of less than 180 and in the event of sugars less than 60 mg/dl or greater than 400 mg/dl please notify the clinic ASAP. It is recommended that you undergo annual eye exams and annual foot exams. Pneumonia vaccine is recommended. - lisinopril (PRINIVIL,ZESTRIL) 10 MG tablet; Take 1 tablet (10 mg total) by mouth daily.  Dispense: 90 tablet; Refill: 1 - amLODipine (NORVASC) 10 MG tablet; Take 1 tablet (10 mg total) by mouth daily.  Dispense: 90 tablet; Refill: 1  5.  Chronic gout without tophus, unspecified cause, unspecified site Controlled - allopurinol (ZYLOPRIM) 300 MG tablet; Take 1 tablet (300 mg total) by mouth daily.  Dispense: 90 tablet; Refill: 1   Meds ordered this encounter  Medications  . traZODone (DESYREL) 50 MG tablet    Sig: Take 1 tablet (50 mg total) by mouth at bedtime as needed for sleep.    Dispense:  30 tablet    Refill:  3  . omeprazole (PRILOSEC) 20 MG capsule    Sig: Take 1 capsule (20 mg total) by mouth daily.    Dispense:  90 capsule    Refill:  1  . lisinopril (PRINIVIL,ZESTRIL) 10 MG tablet    Sig: Take 1 tablet (10 mg total) by mouth daily.    Dispense:  90 tablet    Refill:  1  . amLODipine (NORVASC) 10 MG tablet    Sig: Take 1 tablet (10 mg total) by mouth daily.    Dispense:  90 tablet    Refill:  1  . allopurinol (ZYLOPRIM) 300 MG tablet    Sig: Take 1 tablet (300 mg total) by mouth daily.    Dispense:  90 tablet    Refill:  1  . Insulin Glargine (LANTUS SOLOSTAR) 100 UNIT/ML Solostar Pen    Sig: Inject 24 Units into the skin daily.    Dispense:  5 pen    Refill:  5  . Insulin Pen Needle 31G X 5 MM MISC    Sig: 1 each by Does not apply route at bedtime.    Dispense:  30 each    Refill:  5    Follow-up: Return in about 3 months (around 01/15/2018) for Follow-up of chronic medical conditions.   Charlott Rakes MD

## 2017-10-15 NOTE — Patient Instructions (Signed)
Diabetes Mellitus and Nutrition When you have diabetes (diabetes mellitus), it is very important to have healthy eating habits because your blood sugar (glucose) levels are greatly affected by what you eat and drink. Eating healthy foods in the appropriate amounts, at about the same times every day, can help you:  Control your blood glucose.  Lower your risk of heart disease.  Improve your blood pressure.  Reach or maintain a healthy weight.  Every person with diabetes is different, and each person has different needs for a meal plan. Your health care provider may recommend that you work with a diet and nutrition specialist (dietitian) to make a meal plan that is best for you. Your meal plan may vary depending on factors such as:  The calories you need.  The medicines you take.  Your weight.  Your blood glucose, blood pressure, and cholesterol levels.  Your activity level.  Other health conditions you have, such as heart or kidney disease.  How do carbohydrates affect me? Carbohydrates affect your blood glucose level more than any other type of food. Eating carbohydrates naturally increases the amount of glucose in your blood. Carbohydrate counting is a method for keeping track of how many carbohydrates you eat. Counting carbohydrates is important to keep your blood glucose at a healthy level, especially if you use insulin or take certain oral diabetes medicines. It is important to know how many carbohydrates you can safely have in each meal. This is different for every person. Your dietitian can help you calculate how many carbohydrates you should have at each meal and for snack. Foods that contain carbohydrates include:  Bread, cereal, rice, pasta, and crackers.  Potatoes and corn.  Peas, beans, and lentils.  Milk and yogurt.  Fruit and juice.  Desserts, such as cakes, cookies, ice cream, and candy.  How does alcohol affect me? Alcohol can cause a sudden decrease in blood  glucose (hypoglycemia), especially if you use insulin or take certain oral diabetes medicines. Hypoglycemia can be a life-threatening condition. Symptoms of hypoglycemia (sleepiness, dizziness, and confusion) are similar to symptoms of having too much alcohol. If your health care provider says that alcohol is safe for you, follow these guidelines:  Limit alcohol intake to no more than 1 drink per day for nonpregnant women and 2 drinks per day for men. One drink equals 12 oz of beer, 5 oz of wine, or 1 oz of hard liquor.  Do not drink on an empty stomach.  Keep yourself hydrated with water, diet soda, or unsweetened iced tea.  Keep in mind that regular soda, juice, and other mixers may contain a lot of sugar and must be counted as carbohydrates.  What are tips for following this plan? Reading food labels  Start by checking the serving size on the label. The amount of calories, carbohydrates, fats, and other nutrients listed on the label are based on one serving of the food. Many foods contain more than one serving per package.  Check the total grams (g) of carbohydrates in one serving. You can calculate the number of servings of carbohydrates in one serving by dividing the total carbohydrates by 15. For example, if a food has 30 g of total carbohydrates, it would be equal to 2 servings of carbohydrates.  Check the number of grams (g) of saturated and trans fats in one serving. Choose foods that have low or no amount of these fats.  Check the number of milligrams (mg) of sodium in one serving. Most people   should limit total sodium intake to less than 2,300 mg per day.  Always check the nutrition information of foods labeled as "low-fat" or "nonfat". These foods may be higher in added sugar or refined carbohydrates and should be avoided.  Talk to your dietitian to identify your daily goals for nutrients listed on the label. Shopping  Avoid buying canned, premade, or processed foods. These  foods tend to be high in fat, sodium, and added sugar.  Shop around the outside edge of the grocery store. This includes fresh fruits and vegetables, bulk grains, fresh meats, and fresh dairy. Cooking  Use low-heat cooking methods, such as baking, instead of high-heat cooking methods like deep frying.  Cook using healthy oils, such as olive, canola, or sunflower oil.  Avoid cooking with butter, cream, or high-fat meats. Meal planning  Eat meals and snacks regularly, preferably at the same times every day. Avoid going long periods of time without eating.  Eat foods high in fiber, such as fresh fruits, vegetables, beans, and whole grains. Talk to your dietitian about how many servings of carbohydrates you can eat at each meal.  Eat 4-6 ounces of lean protein each day, such as lean meat, chicken, fish, eggs, or tofu. 1 ounce is equal to 1 ounce of meat, chicken, or fish, 1 egg, or 1/4 cup of tofu.  Eat some foods each day that contain healthy fats, such as avocado, nuts, seeds, and fish. Lifestyle   Check your blood glucose regularly.  Exercise at least 30 minutes 5 or more days each week, or as told by your health care provider.  Take medicines as told by your health care provider.  Do not use any products that contain nicotine or tobacco, such as cigarettes and e-cigarettes. If you need help quitting, ask your health care provider.  Work with a counselor or diabetes educator to identify strategies to manage stress and any emotional and social challenges. What are some questions to ask my health care provider?  Do I need to meet with a diabetes educator?  Do I need to meet with a dietitian?  What number can I call if I have questions?  When are the best times to check my blood glucose? Where to find more information:  American Diabetes Association: diabetes.org/food-and-fitness/food  Academy of Nutrition and Dietetics:  www.eatright.org/resources/health/diseases-and-conditions/diabetes  National Institute of Diabetes and Digestive and Kidney Diseases (NIH): www.niddk.nih.gov/health-information/diabetes/overview/diet-eating-physical-activity Summary  A healthy meal plan will help you control your blood glucose and maintain a healthy lifestyle.  Working with a diet and nutrition specialist (dietitian) can help you make a meal plan that is best for you.  Keep in mind that carbohydrates and alcohol have immediate effects on your blood glucose levels. It is important to count carbohydrates and to use alcohol carefully. This information is not intended to replace advice given to you by your health care provider. Make sure you discuss any questions you have with your health care provider. Document Released: 11/09/2004 Document Revised: 03/19/2016 Document Reviewed: 03/19/2016 Elsevier Interactive Patient Education  2018 Elsevier Inc.  

## 2017-10-16 ENCOUNTER — Ambulatory Visit: Payer: No Typology Code available for payment source | Attending: Family Medicine

## 2017-10-16 DIAGNOSIS — E1165 Type 2 diabetes mellitus with hyperglycemia: Secondary | ICD-10-CM | POA: Insufficient documentation

## 2017-10-16 NOTE — Progress Notes (Signed)
Patient here for lab visit only 

## 2017-10-17 ENCOUNTER — Telehealth: Payer: Self-pay

## 2017-10-17 LAB — CMP14+EGFR
ALT: 17 IU/L (ref 0–44)
AST: 17 IU/L (ref 0–40)
Albumin/Globulin Ratio: 1.5 (ref 1.2–2.2)
Albumin: 4.3 g/dL (ref 3.6–4.8)
Alkaline Phosphatase: 79 IU/L (ref 39–117)
BUN/Creatinine Ratio: 12 (ref 10–24)
BUN: 11 mg/dL (ref 8–27)
Bilirubin Total: 0.4 mg/dL (ref 0.0–1.2)
CALCIUM: 9.3 mg/dL (ref 8.6–10.2)
CO2: 23 mmol/L (ref 20–29)
CREATININE: 0.93 mg/dL (ref 0.76–1.27)
Chloride: 101 mmol/L (ref 96–106)
GFR calc Af Amer: 102 mL/min/{1.73_m2} (ref >59)
GFR, EST NON AFRICAN AMERICAN: 88 mL/min/{1.73_m2} (ref >59)
Globulin, Total: 2.8 g/dL (ref 1.5–4.5)
Glucose: 141 mg/dL — ABNORMAL HIGH (ref 65–99)
Potassium: 4.4 mmol/L (ref 3.5–5.2)
Sodium: 139 mmol/L (ref 134–144)
Total Protein: 7.1 g/dL (ref 6.0–8.5)

## 2017-10-17 LAB — LIPID PANEL
CHOL/HDL RATIO: 4.9 ratio (ref 0.0–5.0)
Cholesterol, Total: 136 mg/dL (ref 100–199)
HDL: 28 mg/dL — AB (ref >39)
LDL CALC: 81 mg/dL (ref 0–99)
Triglycerides: 133 mg/dL (ref 0–149)
VLDL Cholesterol Cal: 27 mg/dL (ref 5–40)

## 2017-10-17 NOTE — Telephone Encounter (Signed)
Patient was called and informed of lab results. 

## 2017-10-17 NOTE — Telephone Encounter (Signed)
-----   Message from Charlott Rakes, MD sent at 10/17/2017  8:33 AM EDT ----- Total cholesterol is normal but HDL (good cholesterol).  This can be raised by increasing physical activity and adhering to a low-cholesterol diet.  Other labs are stable

## 2017-11-23 ENCOUNTER — Ambulatory Visit (HOSPITAL_COMMUNITY)
Admission: EM | Admit: 2017-11-23 | Discharge: 2017-11-23 | Disposition: A | Discharge status: Home / Self Care | Payer: No Typology Code available for payment source | Attending: Family Medicine | Admitting: Family Medicine

## 2017-11-23 ENCOUNTER — Encounter (HOSPITAL_COMMUNITY): Payer: Self-pay | Admitting: Emergency Medicine

## 2017-11-23 DIAGNOSIS — R05 Cough: Secondary | ICD-10-CM

## 2017-11-23 DIAGNOSIS — R059 Cough, unspecified: Secondary | ICD-10-CM

## 2017-11-23 DIAGNOSIS — J22 Unspecified acute lower respiratory infection: Secondary | ICD-10-CM

## 2017-11-23 MED ORDER — ALBUTEROL SULFATE HFA 108 (90 BASE) MCG/ACT IN AERS
INHALATION_SPRAY | RESPIRATORY_TRACT | Status: AC
  Filled 2017-11-23: qty 6.7

## 2017-11-23 MED ORDER — BENZONATATE 100 MG PO CAPS: 100.0000 mg | ORAL_CAPSULE | Freq: Three times a day (TID) | ORAL | 0 refills | Status: DC

## 2017-11-23 MED ORDER — AZITHROMYCIN 250 MG PO TABS: 250.0000 mg | ORAL_TABLET | Freq: Every day | ORAL | 0 refills | Status: DC

## 2017-11-23 MED ORDER — ALBUTEROL SULFATE HFA 108 (90 BASE) MCG/ACT IN AERS
2.0000 | INHALATION_SPRAY | Freq: Once | RESPIRATORY_TRACT | Status: AC
  Administered 2017-11-23: 2 via RESPIRATORY_TRACT

## 2017-11-23 NOTE — ED Triage Notes (Signed)
Pt c/o cough x2 weeks coughing up phlegm.

## 2017-11-23 NOTE — Discharge Instructions (Signed)
Declines chest x-ray today Declines breathing treatment Albuterol inhaler given in office.  Use as needed for shortness of breath or wheezing Get plenty of rest and push fluids Use OTC medication as needed for symptomatic relief Take antibiotic as directed and to completion Prescribed tessolone perles as needed for cough Follow up with PCP if symptoms persist Return or go to ER if you have any new or worsening symptoms

## 2017-11-23 NOTE — ED Provider Notes (Signed)
[facility]   [id number] 11/23/17 Arrival Time: 1121  CC: Cough  SUBJECTIVE:  Ray Garner is a 62 y.o. male who presents with worsening cough x 2 weeks ago.  Admits to positive sick exposure.  Describes cough as constant and productive with clear sputum.  Has tried mucinex without relief.  Symptoms are made worse with laying down at night.  Reports previous symptoms in the past. Complains of fatigue, sinus pain/pressure, rhinorrhea, SOB, and wheezing,    Denies fever, chills, fatigue, sore throat, chest pain, nausea, changes in bowel or bladder habits.    ROS: As per HPI.  Past Medical History:  Diagnosis Date  . Arthritis   . Back pain, lumbosacral 06/30/2013  . DM type 2 (diabetes mellitus, type 2) ([place]) 06/30/2013  . Gout   . HTN (hypertension) 06/30/2013  . Hypertension   . Hypothyroid 06/30/2013   Past Surgical History:  Procedure Laterality Date  . CYST EXCISION    . FRACTURE SURGERY     Allergies  Allergen Reactions  . Vicodin [Hydrocodone-Acetaminophen] Hives and Rash   No current facility-administered medications on file prior to encounter.    Current Outpatient Medications on File Prior to Encounter  Medication Sig Dispense Refill  . allopurinol (ZYLOPRIM) 300 MG tablet Take 1 tablet (300 mg total) by mouth daily. 90 tablet 1  . amLODipine (NORVASC) 10 MG tablet Take 1 tablet (10 mg total) by mouth daily. 90 tablet 1  . Continuous Blood Gluc Receiver (FREESTYLE LIBRE 14 DAY READER) DEVI 1 each by Does not apply route 3 (three) times daily with meals. 1 Device 0  . Continuous Blood Gluc Sensor (FREESTYLE LIBRE 14 DAY SENSOR) MISC 1 each by Does not apply route 3 (three) times daily with meals. 1 each 11  . glucose blood (BAYER CONTOUR TEST) test strip Use as instructed 100 each 12  . Insulin Glargine (LANTUS SOLOSTAR) 100 UNIT/ML Solostar Pen Inject 24 Units into the skin daily. 5 pen 5  . Insulin Pen Needle 31G X 5 MM MISC 1 each by Does not apply route at  bedtime. 30 each 5  . levothyroxine (SYNTHROID, LEVOTHROID) 175 MCG tablet TAKE 1 TABLET BY MOUTH ONCE DAILY BEFORE BREAKFAST 90 tablet 1  . lisinopril (PRINIVIL,ZESTRIL) 10 MG tablet Take 1 tablet (10 mg total) by mouth daily. 90 tablet 1  . metFORMIN (GLUCOPHAGE) 500 MG tablet Take 2 tablets (1,000 mg total) by mouth 2 (two) times daily with a meal. 180 tablet 0  . omeprazole (PRILOSEC) 20 MG capsule Take 1 capsule (20 mg total) by mouth daily. 90 capsule 1  . traZODone (DESYREL) 50 MG tablet Take 1 tablet (50 mg total) by mouth at bedtime as needed for sleep. 30 tablet 3  . vitamin B-12 (CYANOCOBALAMIN) 1000 MCG tablet Take 1,000 mcg by mouth daily.      Social History   Socioeconomic History  . Marital status: Divorced    Spouse name: Not on file  . Number of children: Not on file  . Years of education: Not on file  . Highest education level: Not on file  Occupational History  . Not on file  Social Needs  . Financial resource strain: Not on file  . Food insecurity:    Worry: Not on file    Inability: Not on file  . Transportation needs:    Medical: Not on file    Non-medical: Not on file  Tobacco Use  . Smoking status: Former Smoker    Last  attempt to quit: 05/14/1983    Years since quitting: 34.5  . Smokeless tobacco: Former Network engineer and Sexual Activity  . Alcohol use: Yes  . Drug use: No  . Sexual activity: Not on file  Lifestyle  . Physical activity:    Days per week: Not on file    Minutes per session: Not on file  . Stress: Not on file  Relationships  . Social connections:    Talks on phone: Not on file    Gets together: Not on file    Attends religious service: Not on file    Active member of club or organization: Not on file    Attends meetings of clubs or organizations: Not on file    Relationship status: Not on file  . Intimate partner violence:    Fear of current or ex partner: Not on file    Emotionally abused: Not on file    Physically abused:  Not on file    Forced sexual activity: Not on file  Other Topics Concern  . Not on file  Social History Narrative  . Not on file   No family history on file.   OBJECTIVE:  Vitals:   11/23/17 1156  BP: (!) 147/97  Pulse: 75  Resp: 18  Temp: 98.8 F (37.1 C)  TempSrc: Temporal  SpO2: 99%     General appearance: AOx3 in no acute distress; appears fatigued HEENT: PERRL.  EOM grossly intact.  Sinuses nontender; mild clear rhinorrhea; tonsils nonerythematous, uvula midline Neck: supple without LAD Lungs: Diffuse wheezes heard throughout Heart: regular rate and rhythm.  Radial pulses 2+ symmetrical bilaterally Skin: warm and dry Psychological: alert and cooperative; normal mood and affect   ASSESSMENT & PLAN:  1. Lower respiratory infection (e.g., bronchitis, pneumonia, pneumonitis, pulmonitis)   2. Cough     Meds ordered this encounter  Medications  . albuterol (PROVENTIL HFA;VENTOLIN HFA) 108 (90 Base) MCG/ACT inhaler 2 puff  . azithromycin (ZITHROMAX) 250 MG tablet    Sig: Take 1 tablet (250 mg total) by mouth daily. Take first 2 tablets together, then 1 every day until finished.    Dispense:  6 tablet    Refill:  0    Order Specific Question:   Supervising Provider    Answer:   Wynona Luna [phone]  . benzonatate (TESSALON) 100 MG capsule    Sig: Take 1 capsule (100 mg total) by mouth every 8 (eight) hours.    Dispense:  21 capsule    Refill:  0    Order Specific Question:   Supervising Provider    Answer:   Wynona Luna [[id number]]   Declines chest x-ray today Declines breathing treatment Albuterol inhaler given in office.  Use as needed for shortness of breath or wheezing Get plenty of rest and push fluids Use OTC medication as needed for symptomatic relief Take antibiotic as directed and to completion Prescribed tessolone perles as needed for cough Follow up with PCP if symptoms persist Return or go to ER if you have any new or worsening  symptoms   Reviewed expectations re: course of current medical issues. Questions answered. Outlined signs and symptoms indicating need for more acute intervention. Patient verbalized understanding. After Visit Summary given.          Lestine Box, PA-C 11/23/17 1350

## 2018-01-10 ENCOUNTER — Other Ambulatory Visit: Payer: Self-pay | Admitting: Family Medicine

## 2018-01-10 DIAGNOSIS — E039 Hypothyroidism, unspecified: Secondary | ICD-10-CM

## 2018-01-10 DIAGNOSIS — E1165 Type 2 diabetes mellitus with hyperglycemia: Secondary | ICD-10-CM

## 2018-01-11 ENCOUNTER — Other Ambulatory Visit: Payer: Self-pay | Admitting: Family Medicine

## 2018-01-11 DIAGNOSIS — E1165 Type 2 diabetes mellitus with hyperglycemia: Secondary | ICD-10-CM

## 2018-01-13 ENCOUNTER — Telehealth: Payer: Self-pay | Admitting: Family Medicine

## 2018-01-13 NOTE — Telephone Encounter (Signed)
1) Medication(s) Requested (by name): Metformin   2) Pharmacy of Choice: [facility]   3) Special Requests:   Approved medications will be sent to the pharmacy, we will reach out if there is an issue.  Requests made after 3pm may not be addressed until the following business day!  If a patient is unsure of the name of the medication(s) please note and ask patient to call back when they are able to provide all info, do not send to responsible party until all information is available!

## 2018-01-14 NOTE — Telephone Encounter (Signed)
MEDICATION REFILLED YESTERDAY

## 2018-01-15 ENCOUNTER — Ambulatory Visit: Payer: No Typology Code available for payment source | Admitting: Family Medicine

## 2018-01-28 ENCOUNTER — Ambulatory Visit: Payer: No Typology Code available for payment source | Attending: Family Medicine | Admitting: Family Medicine

## 2018-01-28 ENCOUNTER — Encounter: Payer: Self-pay | Admitting: Family Medicine

## 2018-01-28 VITALS — BP 151/90 | HR 74 | Temp 98.0°F | Ht 72.0 in | Wt 267.8 lb

## 2018-01-28 DIAGNOSIS — I10 Essential (primary) hypertension: Secondary | ICD-10-CM | POA: Diagnosis not present

## 2018-01-28 DIAGNOSIS — R05 Cough: Secondary | ICD-10-CM | POA: Insufficient documentation

## 2018-01-28 DIAGNOSIS — E1165 Type 2 diabetes mellitus with hyperglycemia: Secondary | ICD-10-CM | POA: Insufficient documentation

## 2018-01-28 DIAGNOSIS — Z87891 Personal history of nicotine dependence: Secondary | ICD-10-CM | POA: Insufficient documentation

## 2018-01-28 DIAGNOSIS — G4709 Other insomnia: Secondary | ICD-10-CM

## 2018-01-28 DIAGNOSIS — G4700 Insomnia, unspecified: Secondary | ICD-10-CM | POA: Insufficient documentation

## 2018-01-28 DIAGNOSIS — R059 Cough, unspecified: Secondary | ICD-10-CM

## 2018-01-28 DIAGNOSIS — Z79890 Hormone replacement therapy (postmenopausal): Secondary | ICD-10-CM | POA: Insufficient documentation

## 2018-01-28 DIAGNOSIS — Z79899 Other long term (current) drug therapy: Secondary | ICD-10-CM | POA: Insufficient documentation

## 2018-01-28 DIAGNOSIS — Z885 Allergy status to narcotic agent status: Secondary | ICD-10-CM | POA: Insufficient documentation

## 2018-01-28 DIAGNOSIS — E039 Hypothyroidism, unspecified: Secondary | ICD-10-CM | POA: Insufficient documentation

## 2018-01-28 DIAGNOSIS — Z794 Long term (current) use of insulin: Secondary | ICD-10-CM | POA: Insufficient documentation

## 2018-01-28 DIAGNOSIS — M1A9XX0 Chronic gout, unspecified, without tophus (tophi): Secondary | ICD-10-CM | POA: Insufficient documentation

## 2018-01-28 DIAGNOSIS — M1990 Unspecified osteoarthritis, unspecified site: Secondary | ICD-10-CM | POA: Insufficient documentation

## 2018-01-28 LAB — POCT GLYCOSYLATED HEMOGLOBIN (HGB A1C): Hemoglobin A1C: 8.8 % — AB (ref 4.0–5.6)

## 2018-01-28 LAB — GLUCOSE, POCT (MANUAL RESULT ENTRY): POC GLUCOSE: 155 mg/dL — AB (ref 70–99)

## 2018-01-28 MED ORDER — LOSARTAN POTASSIUM 100 MG PO TABS: 100.0000 mg | ORAL_TABLET | Freq: Every day | ORAL | 6 refills | Status: DC

## 2018-01-28 MED ORDER — TRAZODONE HCL 50 MG PO TABS: 50.0000 mg | ORAL_TABLET | Freq: Every evening | ORAL | 3 refills | Status: DC | PRN

## 2018-01-28 MED ORDER — INSULIN GLARGINE 100 UNIT/ML SOLOSTAR PEN: 30.0000 [IU] | PEN_INJECTOR | Freq: Every day | SUBCUTANEOUS | 5 refills | Status: DC

## 2018-01-28 MED ORDER — CETIRIZINE HCL 10 MG PO TABS: 10.0000 mg | ORAL_TABLET | Freq: Every day | ORAL | 1 refills | Status: DC

## 2018-01-28 MED ORDER — ALLOPURINOL 300 MG PO TABS: 300.0000 mg | ORAL_TABLET | Freq: Every day | ORAL | 1 refills | Status: DC

## 2018-01-28 MED ORDER — METFORMIN HCL 500 MG PO TABS: 1000.0000 mg | ORAL_TABLET | Freq: Two times a day (BID) | ORAL | 6 refills | Status: DC

## 2018-01-28 MED ORDER — AMLODIPINE BESYLATE 10 MG PO TABS: 10.0000 mg | ORAL_TABLET | Freq: Every day | ORAL | 1 refills | Status: DC

## 2018-01-28 MED FILL — metFORMIN HCL 500 MG TABS: 500 | 30 days supply | Qty: 120 | Fill #0

## 2018-01-28 MED FILL — ALLOPURINOL 300 MG TAB: 300 | 30 days supply | Qty: 30 | Fill #0

## 2018-01-28 MED FILL — traZODone HCL 50 MG TABS: 50 | 30 days supply | Qty: 30 | Fill #0

## 2018-01-28 MED FILL — LOSARTAN POTASSIUM 100 MG T: 100 | 30 days supply | Qty: 30 | Fill #0

## 2018-01-28 MED FILL — AMLODIPINE BESYLATE 10 MG T: 10 | 30 days supply | Qty: 30 | Fill #0

## 2018-01-28 MED FILL — LANTUS SOLOSTAR 100 UNITS/M: 100 | 20 days supply | Qty: 6 | Fill #0

## 2018-01-28 NOTE — Progress Notes (Signed)
Subjective:  Patient ID: Ray Garner, male    DOB: [birth year]  Age: 62 y.o. MRN: [id number]  CC: Diabetes   HPI Ray Garner is a 62 year old male with history of type 2 diabetes mellitus (A1c 8.8), hypertension, hypothyroidism, Gout who presents for a follow-up visit. He endorses compliance with his insulin but not with a diabetic diet Denies visual concerns, numbness in extremities but has had some hand cramps which occur intermittently and in the morning. He complains of a 22-month history of cough which wakes him up at night, postnasal drip and endorses some night sweats but no fever.Denies wheezing, dyspnea. He does have history of smoking but quit about 34 years ago His blood pressure is elevated but he endorses compliance with his antihypertensive. Denies acute gout flares and has been compliant with levothyroxine. He has had diarrhea for the last 3 to 4 days but states this is improving.  Past Medical History:  Diagnosis Date  . Arthritis   . Back pain, lumbosacral 06/30/2013  . DM type 2 (diabetes mellitus, type 2) ([place]) 06/30/2013  . Gout   . HTN (hypertension) 06/30/2013  . Hypertension   . Hypothyroid 06/30/2013    Past Surgical History:  Procedure Laterality Date  . CYST EXCISION    . FRACTURE SURGERY      Allergies  Allergen Reactions  . Vicodin [Hydrocodone-Acetaminophen] Hives and Rash     Outpatient Medications Prior to Visit  Medication Sig Dispense Refill  . benzonatate (TESSALON) 100 MG capsule Take 1 capsule (100 mg total) by mouth every 8 (eight) hours. 21 capsule 0  . Continuous Blood Gluc Receiver (FREESTYLE LIBRE 14 DAY READER) DEVI 1 each by Does not apply route 3 (three) times daily with meals. 1 Device 0  . Continuous Blood Gluc Sensor (FREESTYLE LIBRE 14 DAY SENSOR) MISC 1 each by Does not apply route 3 (three) times daily with meals. 1 each 11  . glucose blood (BAYER CONTOUR TEST) test strip Use as instructed 100 each 12  . Insulin Pen Needle 31G X 5  MM MISC 1 each by Does not apply route at bedtime. 30 each 5  . levothyroxine (SYNTHROID, LEVOTHROID) 175 MCG tablet TAKE 1 TABLET BY MOUTH ONCE DAILY BEFORE BREAKFAST 90 tablet 0  . omeprazole (PRILOSEC) 20 MG capsule Take 1 capsule (20 mg total) by mouth daily. 90 capsule 1  . vitamin B-12 (CYANOCOBALAMIN) 1000 MCG tablet Take 1,000 mcg by mouth daily.    Marland Kitchen allopurinol (ZYLOPRIM) 300 MG tablet Take 1 tablet (300 mg total) by mouth daily. 90 tablet 1  . amLODipine (NORVASC) 10 MG tablet Take 1 tablet (10 mg total) by mouth daily. 90 tablet 1  . azithromycin (ZITHROMAX) 250 MG tablet Take 1 tablet (250 mg total) by mouth daily. Take first 2 tablets together, then 1 every day until finished. 6 tablet 0  . Insulin Glargine (LANTUS SOLOSTAR) 100 UNIT/ML Solostar Pen Inject 24 Units into the skin daily. 5 pen 5  . lisinopril (PRINIVIL,ZESTRIL) 10 MG tablet Take 1 tablet (10 mg total) by mouth daily. 90 tablet 1  . metFORMIN (GLUCOPHAGE) 500 MG tablet TAKE 2 TABLETS BY MOUTH TWICE DAILY WITH A MEAL 180 tablet 0  . traZODone (DESYREL) 50 MG tablet Take 1 tablet (50 mg total) by mouth at bedtime as needed for sleep. 30 tablet 3   No facility-administered medications prior to visit.     ROS Review of Systems  Constitutional: Negative for activity change and appetite change.  HENT:  Negative for sinus pressure and sore throat.   Eyes: Negative for visual disturbance.  Respiratory: Positive for cough. Negative for chest tightness and shortness of breath.   Cardiovascular: Negative for chest pain and leg swelling.  Gastrointestinal: Positive for diarrhea. Negative for abdominal distention, abdominal pain and constipation.  Endocrine: Negative.   Genitourinary: Negative for dysuria.  Musculoskeletal: Negative for joint swelling and myalgias.  Skin: Negative for rash.  Allergic/Immunologic: Negative.   Neurological: Negative for weakness, light-headedness and numbness.  Psychiatric/Behavioral:  Negative for dysphoric mood and suicidal ideas.    Objective:  BP (!) 151/90   Pulse 74   Temp 98 F (36.7 C) (Oral)   Ht 6' (1.829 m)   Wt 267 lb 12.8 oz (121.5 kg)   SpO2 96%   BMI 36.32 kg/m   BP/Weight 01/28/2018 11/23/2017 8/41/6606  Systolic BP 301 601 093  Diastolic BP 90 97 79  Wt. (Lbs) 267.8 - 261.2  BMI 36.32 - 35.43      Physical Exam  Constitutional: He is oriented to person, place, and time. He appears well-developed and well-nourished.  Cardiovascular: Normal rate, normal heart sounds and intact distal pulses.  No murmur heard. Pulmonary/Chest: Effort normal and breath sounds normal. He has no wheezes. He has no rales. He exhibits no tenderness.  Abdominal: Soft. Bowel sounds are normal. He exhibits no distension and no mass. There is no tenderness.  Musculoskeletal: Normal range of motion.  Neurological: He is alert and oriented to person, place, and time.  Skin: Skin is warm and dry.  Psychiatric: He has a normal mood and affect.     Lab Results  Component Value Date   HGBA1C 8.8 (A) 01/28/2018    Assessment & Plan:   1. Type 2 diabetes mellitus with hyperglycemia, without long-term current use of insulin (HCC) Uncontrolled with A1c of 8.8 Increased dose of Lantus Counseled on Diabetic diet, my plate method, 235 minutes of moderate intensity exercise/week Keep blood sugar logs with fasting goals of 80-120 mg/dl, random of less than 180 and in the event of sugars less than 60 mg/dl or greater than 400 mg/dl please notify the clinic ASAP. It is recommended that you undergo annual eye exams and annual foot exams. Pneumonia vaccine is recommended. - POCT glucose (manual entry) - metFORMIN (GLUCOPHAGE) 500 MG tablet; Take 2 tablets (1,000 mg total) by mouth 2 (two) times daily with a meal.  Dispense: 120 tablet; Refill: 6 - POCT glycosylated hemoglobin (Hb A1C) - Insulin Glargine (LANTUS SOLOSTAR) 100 UNIT/ML Solostar Pen; Inject 30 Units into the skin  daily.  Dispense: 5 pen; Refill: 5  2. Other insomnia Stable - traZODone (DESYREL) 50 MG tablet; Take 1 tablet (50 mg total) by mouth at bedtime as needed for sleep.  Dispense: 30 tablet; Refill: 3  3. Essential hypertension Uncontrolled Counseled on blood pressure goal of less than 130/80, low-sodium, DASH diet, medication compliance, 150 minutes of moderate intensity exercise per week. Discussed medication compliance, adverse effects. - losartan (COZAAR) 100 MG tablet; Take 1 tablet (100 mg total) by mouth daily.  Dispense: 30 tablet; Refill: 6 - amLODipine (NORVASC) 10 MG tablet; Take 1 tablet (10 mg total) by mouth daily.  Dispense: 90 tablet; Refill: 1 - Basic Metabolic Panel  4. Chronic gout without tophus, unspecified cause, unspecified site No recent flares - allopurinol (ZYLOPRIM) 300 MG tablet; Take 1 tablet (300 mg total) by mouth daily.  Dispense: 90 tablet; Refill: 1  5. Cough Chronic cough-we will need to exclude  TB especially in the setting of night sweats We will also treat with antihistamine If labs are negative and symptoms persist will consider PFTs. - QuantiFERON-TB Gold Plus - cetirizine (ZYRTEC) 10 MG tablet; Take 1 tablet (10 mg total) by mouth daily.  Dispense: 30 tablet; Refill: 1  6. Hypothyroidism, unspecified type - T4, free - TSH   Meds ordered this encounter  Medications  . losartan (COZAAR) 100 MG tablet    Sig: Take 1 tablet (100 mg total) by mouth daily.    Dispense:  30 tablet    Refill:  6    Discontinue lisinopril  . traZODone (DESYREL) 50 MG tablet    Sig: Take 1 tablet (50 mg total) by mouth at bedtime as needed for sleep.    Dispense:  30 tablet    Refill:  3  . metFORMIN (GLUCOPHAGE) 500 MG tablet    Sig: Take 2 tablets (1,000 mg total) by mouth 2 (two) times daily with a meal.    Dispense:  120 tablet    Refill:  6    Please consider 90 day supplies to promote better adherence  . amLODipine (NORVASC) 10 MG tablet    Sig: Take 1  tablet (10 mg total) by mouth daily.    Dispense:  90 tablet    Refill:  1  . allopurinol (ZYLOPRIM) 300 MG tablet    Sig: Take 1 tablet (300 mg total) by mouth daily.    Dispense:  90 tablet    Refill:  1  . Insulin Glargine (LANTUS SOLOSTAR) 100 UNIT/ML Solostar Pen    Sig: Inject 30 Units into the skin daily.    Dispense:  5 pen    Refill:  5    Discontinue previous dose  . cetirizine (ZYRTEC) 10 MG tablet    Sig: Take 1 tablet (10 mg total) by mouth daily.    Dispense:  30 tablet    Refill:  1    Follow-up: Return in about 3 months (around 04/29/2018) for Follow-up of chronic medical conditions.   Charlott Rakes MD

## 2018-01-28 NOTE — Progress Notes (Signed)
Patient has been coughing for 2 months.

## 2018-01-29 ENCOUNTER — Other Ambulatory Visit: Payer: Self-pay | Admitting: Family Medicine

## 2018-01-29 DIAGNOSIS — E039 Hypothyroidism, unspecified: Secondary | ICD-10-CM

## 2018-01-29 MED ORDER — LEVOTHYROXINE SODIUM 175 MCG PO TABS: 175.0000 ug | ORAL_TABLET | Freq: Every day | ORAL | 1 refills | Status: DC

## 2018-01-31 LAB — BASIC METABOLIC PANEL
BUN / CREAT RATIO: 11 (ref 10–24)
BUN: 11 mg/dL (ref 8–27)
CO2: 21 mmol/L (ref 20–29)
Calcium: 9.6 mg/dL (ref 8.6–10.2)
Chloride: 107 mmol/L — ABNORMAL HIGH (ref 96–106)
Creatinine, Ser: 0.96 mg/dL (ref 0.76–1.27)
GFR, EST AFRICAN AMERICAN: 98 mL/min/{1.73_m2} (ref >59)
GFR, EST NON AFRICAN AMERICAN: 85 mL/min/{1.73_m2} (ref >59)
Glucose: 131 mg/dL — ABNORMAL HIGH (ref 65–99)
Potassium: 4.3 mmol/L (ref 3.5–5.2)
SODIUM: 145 mmol/L — AB (ref 134–144)

## 2018-01-31 LAB — QUANTIFERON-TB GOLD PLUS
QUANTIFERON NIL VALUE: 0.06 [IU]/mL
QUANTIFERON TB1 AG VALUE: 0.07 [IU]/mL
QUANTIFERON TB2 AG VALUE: 0.06 [IU]/mL
QUANTIFERON-TB GOLD PLUS: NEGATIVE
QuantiFERON Mitogen Value: >10 IU/mL

## 2018-01-31 LAB — T4, FREE: FREE T4: 1.26 ng/dL (ref 0.82–1.77)

## 2018-01-31 LAB — TSH: TSH: 0.64 u[IU]/mL (ref 0.450–4.500)

## 2018-02-03 ENCOUNTER — Telehealth: Payer: Self-pay

## 2018-02-03 NOTE — Telephone Encounter (Signed)
Patient was called and informed of lab results. 

## 2018-02-03 NOTE — Telephone Encounter (Signed)
-----   Message from Charlott Rakes, MD sent at 01/31/2018  5:32 PM EST ----- TB test is negative

## 2018-04-03 ENCOUNTER — Ambulatory Visit (INDEPENDENT_AMBULATORY_CARE_PROVIDER_SITE_OTHER): Payer: PRIVATE HEALTH INSURANCE

## 2018-04-03 ENCOUNTER — Encounter (HOSPITAL_COMMUNITY): Payer: Self-pay | Admitting: Emergency Medicine

## 2018-04-03 ENCOUNTER — Ambulatory Visit (HOSPITAL_COMMUNITY)
Admission: EM | Admit: 2018-04-03 | Discharge: 2018-04-03 | Disposition: A | Discharge status: Home / Self Care | Payer: PRIVATE HEALTH INSURANCE | Attending: Emergency Medicine | Admitting: Emergency Medicine

## 2018-04-03 ENCOUNTER — Other Ambulatory Visit: Payer: Self-pay

## 2018-04-03 DIAGNOSIS — R05 Cough: Secondary | ICD-10-CM | POA: Diagnosis not present

## 2018-04-03 DIAGNOSIS — R062 Wheezing: Secondary | ICD-10-CM

## 2018-04-03 DIAGNOSIS — R059 Cough, unspecified: Secondary | ICD-10-CM

## 2018-04-03 MED ORDER — ALBUTEROL SULFATE HFA 108 (90 BASE) MCG/ACT IN AERS: 1.0000 | INHALATION_SPRAY | Freq: Four times a day (QID) | RESPIRATORY_TRACT | 0 refills | Status: DC | PRN

## 2018-04-03 MED ORDER — IPRATROPIUM-ALBUTEROL 0.5-2.5 (3) MG/3ML IN SOLN
3.0000 mL | Freq: Once | RESPIRATORY_TRACT | Status: AC
  Administered 2018-04-03: 3 mL via RESPIRATORY_TRACT

## 2018-04-03 MED ORDER — IPRATROPIUM-ALBUTEROL 0.5-2.5 (3) MG/3ML IN SOLN
RESPIRATORY_TRACT | Status: AC
  Filled 2018-04-03: qty 3

## 2018-04-03 MED ORDER — AEROCHAMBER PLUS MISC: 2 refills | Status: DC

## 2018-04-03 MED ORDER — PROMETHAZINE-DM 6.25-15 MG/5ML PO SYRP: 5.0000 mL | ORAL_SOLUTION | Freq: Four times a day (QID) | ORAL | 0 refills | Status: DC | PRN

## 2018-04-03 MED ORDER — BENZONATATE 200 MG PO CAPS: 200.0000 mg | ORAL_CAPSULE | Freq: Three times a day (TID) | ORAL | 0 refills | Status: DC | PRN

## 2018-04-03 MED ORDER — DOXYCYCLINE HYCLATE 100 MG PO CAPS: 100.0000 mg | ORAL_CAPSULE | Freq: Two times a day (BID) | ORAL | 0 refills | Status: AC

## 2018-04-03 MED ORDER — PREDNISONE 20 MG PO TABS: 40.0000 mg | ORAL_TABLET | Freq: Every day | ORAL | 0 refills | Status: AC

## 2018-04-03 NOTE — ED Provider Notes (Signed)
HPI  SUBJECTIVE:  Ray Garner is a 63 y.o. male who presents with 3 to 4 months of a cough that was initially productive of yellowish phlegm, now productive of clear phlegm.  He states that the cough has been present since he was diagnosed with a lower respiratory tract infection that was treated with azithromycin in late December.  He has seen his PMD for this, and had lisinopril discontinued.  He reports chest, rib, back soreness secondary to the cough.  He reports wheezing, occasional shortness of breath.  No fevers.  No dyspnea on exertion.  No new calf pain or swelling, hemoptysis, surgery in the past 4 weeks.  No Prolonged immobilization.  He states that he was tested for TB and it was negative.  States he is unable to sleep at night secondary to the cough.  No GERD symptoms.  No nasal congestion, rhinorrhea, postnasal drip, sinus pain or pressure.  No unintentional weight loss.  He reports occasional night sweats.  No unintentional weight gain, change in his baseline nocturia, PND, orthopnea, abdominal pain.  No antibiotics in the past month.  No antipyretic in the past 4 to 6 hours.  He has a past medical history of diabetes, hypertension, GERD.  States that he is compliant with this medication.  No history of asthma, emphysema, COPD, smoking, DVT, PE, cancer, CHF.  JKD:TOIZTI, Charlane Ferretti, MD   Past Medical History:  Diagnosis Date  . Arthritis   . Back pain, lumbosacral 06/30/2013  . DM type 2 (diabetes mellitus, type 2) ([place]) 06/30/2013  . Gout   . HTN (hypertension) 06/30/2013  . Hypertension   . Hypothyroid 06/30/2013    Past Surgical History:  Procedure Laterality Date  . CYST EXCISION    . FRACTURE SURGERY      History reviewed. No pertinent family history.  Social History   Tobacco Use  . Smoking status: Former Smoker    Last attempt to quit: 05/14/1983    Years since quitting: 34.9  . Smokeless tobacco: Former Network engineer Use Topics  . Alcohol use: Yes  . Drug use: No     No current facility-administered medications for this encounter.   Current Outpatient Medications:  .  allopurinol (ZYLOPRIM) 300 MG tablet, Take 1 tablet (300 mg total) by mouth daily., Disp: 90 tablet, Rfl: 1 .  amLODipine (NORVASC) 10 MG tablet, Take 1 tablet (10 mg total) by mouth daily., Disp: 90 tablet, Rfl: 1 .  cetirizine (ZYRTEC) 10 MG tablet, Take 1 tablet (10 mg total) by mouth daily., Disp: 30 tablet, Rfl: 1 .  levothyroxine (SYNTHROID, LEVOTHROID) 175 MCG tablet, Take 1 tablet (175 mcg total) by mouth daily before breakfast., Disp: 90 tablet, Rfl: 1 .  losartan (COZAAR) 100 MG tablet, Take 1 tablet (100 mg total) by mouth daily., Disp: 30 tablet, Rfl: 6 .  metFORMIN (GLUCOPHAGE) 500 MG tablet, Take 2 tablets (1,000 mg total) by mouth 2 (two) times daily with a meal., Disp: 120 tablet, Rfl: 6 .  NON FORMULARY, , Disp: , Rfl:  .  omeprazole (PRILOSEC) 20 MG capsule, Take 1 capsule (20 mg total) by mouth daily., Disp: 90 capsule, Rfl: 1 .  traZODone (DESYREL) 50 MG tablet, Take 1 tablet (50 mg total) by mouth at bedtime as needed for sleep., Disp: 30 tablet, Rfl: 3 .  vitamin B-12 (CYANOCOBALAMIN) 1000 MCG tablet, Take 1,000 mcg by mouth daily., Disp: , Rfl:  .  albuterol (PROVENTIL HFA;VENTOLIN HFA) 108 (90 Base) MCG/ACT inhaler, Inhale 1-2  puffs into the lungs every 6 (six) hours as needed for wheezing or shortness of breath., Disp: 1 Inhaler, Rfl: 0 .  benzonatate (TESSALON) 200 MG capsule, Take 1 capsule (200 mg total) by mouth 3 (three) times daily as needed for cough., Disp: 30 capsule, Rfl: 0 .  Continuous Blood Gluc Receiver (FREESTYLE LIBRE 14 DAY READER) DEVI, 1 each by Does not apply route 3 (three) times daily with meals., Disp: 1 Device, Rfl: 0 .  Continuous Blood Gluc Sensor (FREESTYLE LIBRE 14 DAY SENSOR) MISC, 1 each by Does not apply route 3 (three) times daily with meals., Disp: 1 each, Rfl: 11 .  doxycycline (VIBRAMYCIN) 100 MG capsule, Take 1 capsule (100 mg  total) by mouth 2 (two) times daily for 7 days., Disp: 14 capsule, Rfl: 0 .  glucose blood (BAYER CONTOUR TEST) test strip, Use as instructed, Disp: 100 each, Rfl: 12 .  Insulin Glargine (LANTUS SOLOSTAR) 100 UNIT/ML Solostar Pen, Inject 30 Units into the skin daily., Disp: 5 pen, Rfl: 5 .  Insulin Pen Needle 31G X 5 MM MISC, 1 each by Does not apply route at bedtime., Disp: 30 each, Rfl: 5 .  predniSONE (DELTASONE) 20 MG tablet, Take 2 tablets (40 mg total) by mouth daily with breakfast for 5 days., Disp: 10 tablet, Rfl: 0 .  promethazine-dextromethorphan (PROMETHAZINE-DM) 6.25-15 MG/5ML syrup, Take 5 mLs by mouth 4 (four) times daily as needed for cough., Disp: 118 mL, Rfl: 0 .  Spacer/Aero-Holding Chambers (AEROCHAMBER PLUS) inhaler, Use as instructed, Disp: 1 each, Rfl: 2  Allergies  Allergen Reactions  . Vicodin [Hydrocodone-Acetaminophen] Hives and Rash     ROS  As noted in HPI.   Physical Exam  BP (!) 144/69 (BP Location: Right Arm) Comment (BP Location): large cuff  Pulse 87   Temp 98 F (36.7 C) (Temporal)   Resp 20   SpO2 97%   Constitutional: Well developed, well nourished, no acute distress.  Coughing. Eyes:  EOMI, conjunctiva normal bilaterally HENT: Normocephalic, atraumatic,mucus membranes moist.  No nasal congestion.  Normal turbinates.  No maxillary, frontal sinus tenderness.  Unable to completely visualize oropharynx.   Respiratory: Normal inspiratory effort, scattered wheezing throughout.  No rales, rhonchi.  Positive anterior and lateral chest wall tenderness. Cardiovascular: Normal rate regular rhythm, no murmurs rubs or gallops.  No JVD. GI: nondistended soft, no hepatomegaly. skin: No rash, skin intact Musculoskeletal: no deformities calves symmetric, nontender, no edema. Neurologic: Alert & oriented x 3, no focal neuro deficits Psychiatric: Speech and behavior appropriate   ED Course   Medications  ipratropium-albuterol (DUONEB) 0.5-2.5 (3) MG/3ML  nebulizer solution 3 mL (3 mLs Nebulization Given 04/03/18 1258)    Orders Placed This Encounter  Procedures  . DG Chest 2 View    Standing Status:   Standing    Number of Occurrences:   1    Order Specific Question:   Reason for Exam (SYMPTOM  OR DIAGNOSIS REQUIRED)    Answer:   cough 4 months r/o PNA, effusion pulm edema mass    No results found for this or any previous visit (from the past 24 hour(s)). Dg Chest 2 View  Result Date: 04/03/2018 CLINICAL DATA:  Cough and congestion. EXAM: CHEST - 2 VIEW COMPARISON:  No recent prior. FINDINGS: Minimal fullness noted in the anterior mediastinum diffusely. This most likely related to patient rotation and mediastinal fat. No focal mediastinal mass lesion noted. No hilar adenopathy. Borderline cardiomegaly with normal pulmonary vascularity. No focal infiltrate. No pleural  effusion or pneumothorax. Mild thoracic spine compression fractures, age undetermined. Diffuse osteopenia and degenerative change low lung volumes with mild basilar atelectasis. Cardiomegaly with normal pulmonary vascularity. IMPRESSION: Mild cardiomegaly. No pulmonary venous congestion no acute cardiopulmonary disease. 2. Mild thoracic spine compression fractures, age undetermined. Diffuse osteopenia degenerative change. Electronically Signed   By: Frierson   On: 04/03/2018 13:10    ED Clinical Impression  Cough   ED Assessment/Plan  Checking chest x-ray.  He has scattered expiratory wheezing.  Will give DuoNeb and reevaluate.  On reevaluation, patient states that he feels better.  He has persistent wheezing, but improved air movement.  [place] Narcotic database reviewed for this patient, and feel that the risk/benefit ratio today is favorable for proceeding with a prescription for controlled substance.  No opiate prescriptions in the past 2 years.  Reviewed imaging independently.  No acute cardiopulmonary disease.  Mild cardiomegaly.  See radiology report for full  details.  Unable to see the lateral border of the heart clearly.  No obvious infiltrates, though.  Will send home with doxycycline, and albuterol inhaler with a spacer.  He is to take 2 puffs from his albuterol inhaler every 4 hours for the next 2 days, then every 6 hours for the next 2 days and then as needed.  We will send home with prednisone 40 mg for 5 days which he has tolerated before.  He will adjust his insulin accordingly.  Tessalon for the cough during the day, promethazine DM for the cough at night.  Advised him to not drive while taking the Promethazine DM.  Will write a work note for tomorrow and the next day. He will follow-up with his doctor if not getting better after finishing his medications, he is to go to the ER if he gets worse.  Discussed  imaging, MDM, treatment plan, and plan for follow-up with patient. Discussed sn/sx that should prompt return to the ED. patient agrees with plan.   Meds ordered this encounter  Medications  . ipratropium-albuterol (DUONEB) 0.5-2.5 (3) MG/3ML nebulizer solution 3 mL  . albuterol (PROVENTIL HFA;VENTOLIN HFA) 108 (90 Base) MCG/ACT inhaler    Sig: Inhale 1-2 puffs into the lungs every 6 (six) hours as needed for wheezing or shortness of breath.    Dispense:  1 Inhaler    Refill:  0  . predniSONE (DELTASONE) 20 MG tablet    Sig: Take 2 tablets (40 mg total) by mouth daily with breakfast for 5 days.    Dispense:  10 tablet    Refill:  0  . Spacer/Aero-Holding Chambers (AEROCHAMBER PLUS) inhaler    Sig: Use as instructed    Dispense:  1 each    Refill:  2  . benzonatate (TESSALON) 200 MG capsule    Sig: Take 1 capsule (200 mg total) by mouth 3 (three) times daily as needed for cough.    Dispense:  30 capsule    Refill:  0  . doxycycline (VIBRAMYCIN) 100 MG capsule    Sig: Take 1 capsule (100 mg total) by mouth 2 (two) times daily for 7 days.    Dispense:  14 capsule    Refill:  0  . promethazine-dextromethorphan (PROMETHAZINE-DM)  6.25-15 MG/5ML syrup    Sig: Take 5 mLs by mouth 4 (four) times daily as needed for cough.    Dispense:  118 mL    Refill:  0    *This clinic note was created using [employer]. Therefore, there may be occasional  mistakes despite careful proofreading.   ?   Melynda Ripple, MD 04/03/18 1729

## 2018-04-03 NOTE — ED Triage Notes (Signed)
Cough, chest/abdomen soreness with cough.  Sometimes has a clear phlegm.  Denies runny nose, stuffy in head and generalized aching.

## 2018-04-03 NOTE — Discharge Instructions (Addendum)
2 puffs from your albuterol inhaler every 4 hours for the next 2 days, then every 6 hours for the next 2 days and then as needed. prednisone 40 mg for 5 days, adjust his insulin accordingly.  Tessalon for the cough during the day, Tussionex for the cough at night.   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

## 2018-04-13 ENCOUNTER — Other Ambulatory Visit: Payer: Self-pay | Admitting: Family Medicine

## 2018-04-13 DIAGNOSIS — E1165 Type 2 diabetes mellitus with hyperglycemia: Secondary | ICD-10-CM

## 2018-04-13 DIAGNOSIS — E039 Hypothyroidism, unspecified: Secondary | ICD-10-CM

## 2018-04-15 ENCOUNTER — Other Ambulatory Visit: Payer: Self-pay

## 2018-04-15 DIAGNOSIS — E1165 Type 2 diabetes mellitus with hyperglycemia: Secondary | ICD-10-CM

## 2018-04-15 MED FILL — LANTUS SOLOSTAR 100 UNITS/M: 100 | 30 days supply | Qty: 9 | Fill #1

## 2018-04-15 MED FILL — metFORMIN HCL 500 MG TABS: 500 | 90 days supply | Qty: 360 | Fill #1

## 2018-04-15 NOTE — Telephone Encounter (Signed)
Call was transferred to me by Raynelle Fanning at the front desk. Pt reports taking a total of 5 tablets (2,500mg ) of metformin daily. He was told to follow directions on his RX which instruct 2 tabs bid.  He also expressed concern about his lantus being $300+ monthly and I looked into it and we can fill this at our pharmacy at [facility] for $10.

## 2018-04-28 ENCOUNTER — Ambulatory Visit: Payer: PRIVATE HEALTH INSURANCE | Attending: Family Medicine | Admitting: Family Medicine

## 2018-04-28 ENCOUNTER — Encounter: Payer: Self-pay | Admitting: Family Medicine

## 2018-04-28 VITALS — BP 159/86 | HR 76 | Temp 98.1°F | Ht 72.0 in | Wt 279.6 lb

## 2018-04-28 DIAGNOSIS — E1142 Type 2 diabetes mellitus with diabetic polyneuropathy: Secondary | ICD-10-CM

## 2018-04-28 DIAGNOSIS — I10 Essential (primary) hypertension: Secondary | ICD-10-CM

## 2018-04-28 DIAGNOSIS — E1165 Type 2 diabetes mellitus with hyperglycemia: Secondary | ICD-10-CM

## 2018-04-28 DIAGNOSIS — Z794 Long term (current) use of insulin: Secondary | ICD-10-CM | POA: Diagnosis not present

## 2018-04-28 DIAGNOSIS — R05 Cough: Secondary | ICD-10-CM | POA: Diagnosis not present

## 2018-04-28 DIAGNOSIS — Z23 Encounter for immunization: Secondary | ICD-10-CM | POA: Diagnosis not present

## 2018-04-28 DIAGNOSIS — R600 Localized edema: Secondary | ICD-10-CM

## 2018-04-28 DIAGNOSIS — R059 Cough, unspecified: Secondary | ICD-10-CM

## 2018-04-28 DIAGNOSIS — E039 Hypothyroidism, unspecified: Secondary | ICD-10-CM

## 2018-04-28 LAB — POCT GLYCOSYLATED HEMOGLOBIN (HGB A1C): HbA1c, POC (controlled diabetic range): 10.6 % — AB (ref 0.0–7.0)

## 2018-04-28 LAB — GLUCOSE, POCT (MANUAL RESULT ENTRY): POC Glucose: 191 mg/dl — AB (ref 70–99)

## 2018-04-28 MED ORDER — LIRAGLUTIDE 18 MG/3ML ~~LOC~~ SOPN: PEN_INJECTOR | SUBCUTANEOUS | 3 refills | Status: DC

## 2018-04-28 MED ORDER — GABAPENTIN 300 MG PO CAPS: 300.0000 mg | ORAL_CAPSULE | Freq: Every day | ORAL | 3 refills | Status: DC

## 2018-04-28 MED ORDER — CARVEDILOL 12.5 MG PO TABS: 12.5000 mg | ORAL_TABLET | Freq: Two times a day (BID) | ORAL | 3 refills | Status: DC

## 2018-04-28 MED FILL — CARVEDILOL 12.5 MG TABLET: 12.5 | 30 days supply | Qty: 60 | Fill #0

## 2018-04-28 MED FILL — GABAPENTIN 300 MG CAPSULE: 300 | 30 days supply | Qty: 30 | Fill #0

## 2018-04-28 MED FILL — !VICTOZA 18MG/3ML INJECT: 18 | 17 days supply | Qty: 3 | Fill #0

## 2018-04-28 NOTE — Progress Notes (Signed)
Subjective:  Patient ID: Ray Garner, male    DOB: 06-10-1955  Age: 63 y.o. MRN: [id number]  CC: Diabetes   HPI Ray Garner is a 63 year old male with history of type 2 diabetes mellitus (A1c 10.6), hypertension, hypothyroidism, Gout who presents for a follow-up visit.  His A1c is 10.6 which has trended up from 8.8 previously and he endorses compliance with his medications but of note has gained 12 pounds in the last 3 months.  He does not exercise regularly and is not adherent with a diabetic diet. Compliant with his antihypertensives however his blood pressure is elevated.  Doing well on his thyroid medication and denies recent gout flares.  He was seen at the ED 1 month ago for cough which he has had for the last 3 months.  He describes this as follows:  "as soon as ranch touches my throat it leads to coughing; happens with cheese, mayonnaise". Denies allergies to foods  His symptoms are worse at night. Coughs up clear liquids but he denies reflux, abdominal pain or chest pain.  I have not heard him cough in the exam room though. At the ED he was prescribed Proventil MDI which he states has brought about some improvement. 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  Also noticed tingling in feet.  Past Medical History:  Diagnosis Date  . Arthritis   . Back pain, lumbosacral 06/30/2013  . DM type 2 (diabetes mellitus, type 2) ([place]) 06/30/2013  . Gout   . HTN (hypertension) 06/30/2013  . Hypertension   . Hypothyroid 06/30/2013    Past Surgical History:  Procedure Laterality Date  . CYST EXCISION    . FRACTURE SURGERY      History reviewed. No pertinent family history.  Allergies  Allergen Reactions  . Vicodin [Hydrocodone-Acetaminophen] Hives and Rash    Outpatient Medications Prior to Visit  Medication Sig  Dispense Refill  . albuterol (PROVENTIL HFA;VENTOLIN HFA) 108 (90 Base) MCG/ACT inhaler Inhale 1-2 puffs into the lungs every 6 (six) hours as needed for wheezing or shortness of breath. 1 Inhaler 0  . allopurinol (ZYLOPRIM) 300 MG tablet Take 1 tablet (300 mg total) by mouth daily. 90 tablet 1  . Continuous Blood Gluc Receiver (FREESTYLE LIBRE 14 DAY READER) DEVI 1 each by Does not apply route 3 (three) times daily with meals. 1 Device 0  . Continuous Blood Gluc Sensor (FREESTYLE LIBRE 14 DAY SENSOR) MISC 1 each by Does not apply route 3 (three) times daily with meals. 1 each 11  . glucose blood (BAYER CONTOUR TEST) test strip Use as instructed 100 each 12  . Insulin Glargine (LANTUS SOLOSTAR) 100 UNIT/ML Solostar Pen Inject 30 Units into the skin daily. 5 pen 5  . Insulin Pen Needle 31G X 5 MM MISC 1 each by Does not apply route at bedtime. 30 each 5  . levothyroxine (SYNTHROID, LEVOTHROID) 175 MCG tablet TAKE 1 TABLET BY MOUTH ONCE DAILY BEFORE BREAKFAST 90 tablet 0  . losartan (COZAAR) 100 MG tablet Take 1 tablet (100 mg total) by mouth daily. 30 tablet 6  . metFORMIN (GLUCOPHAGE) 500 MG tablet TAKE 2 TABLETS BY MOUTH TWICE DAILY WITH A MEAL 180 tablet 0  . NON FORMULARY     . omeprazole (PRILOSEC) 20 MG capsule Take 1 capsule (20  mg total) by mouth daily. 90 capsule 1  . Spacer/Aero-Holding Chambers (AEROCHAMBER PLUS) inhaler Use as instructed 1 each 2  . traZODone (DESYREL) 50 MG tablet Take 1 tablet (50 mg total) by mouth at bedtime as needed for sleep. 30 tablet 3  . vitamin B-12 (CYANOCOBALAMIN) 1000 MCG tablet Take 1,000 mcg by mouth daily.    Marland Kitchen amLODipine (NORVASC) 10 MG tablet Take 1 tablet (10 mg total) by mouth daily. 90 tablet 1  . benzonatate (TESSALON) 200 MG capsule Take 1 capsule (200 mg total) by mouth 3 (three) times daily as needed for cough. (Patient not taking: Reported on 04/28/2018) 30 capsule 0  . cetirizine (ZYRTEC) 10 MG tablet Take 1 tablet (10 mg total) by mouth daily.  (Patient not taking: Reported on 04/28/2018) 30 tablet 1  . promethazine-dextromethorphan (PROMETHAZINE-DM) 6.25-15 MG/5ML syrup Take 5 mLs by mouth 4 (four) times daily as needed for cough. (Patient not taking: Reported on 04/28/2018) 118 mL 0   No facility-administered medications prior to visit.      ROS Review of Systems General: negative for fever, weight loss, appetite change Eyes: no visual symptoms. ENT: no ear symptoms, no sinus tenderness, no nasal congestion or sore throat. Neck: no pain  Respiratory: no wheezing, shortness of breath, + cough Cardiovascular: no chest pain, no dyspnea on exertion, +pedal edema, no orthopnea. Gastrointestinal: no abdominal pain, no diarrhea, no constipation Genito-Urinary: no urinary frequency, no dysuria, no polyuria. Hematologic: no bruising Endocrine: no cold or heat intolerance Neurological: no headaches, no seizures, no tremors, +numbness Musculoskeletal: no joint pains, no joint swelling Skin: no pruritus, no rash. Psychological: no depression, no anxiety,    Objective:  BP (!) 159/86   Pulse 76   Temp 98.1 F (36.7 C) (Oral)   Ht 6' (1.829 m)   Wt 279 lb 9.6 oz (126.8 kg)   SpO2 98%   BMI 37.92 kg/m   BP/Weight 04/28/2018 04/03/2018 67/07/1947  Systolic BP 932 671 245  Diastolic BP 86 69 90  Wt. (Lbs) 279.6 - 267.8  BMI 37.92 - 36.32      Physical Exam Constitutional: normal appearing,  Eyes: PERRLA HEENT: Head is atraumatic, normal sinuses, normal oropharynx, normal appearing tonsils and palate, tympanic membrane is normal bilaterally. Neck: normal range of motion, no thyromegaly, no JVD Cardiovascular: normal rate and rhythm, normal heart sounds, no murmurs, rub or gallop, 1+ b/l non pitting pedal edema Respiratory: Normal breath sounds, clear to auscultation bilaterally, no wheezes, no rales, no rhonchi Abdomen: soft, not tender to palpation, normal bowel sounds, no enlarged organs Musculoskeletal: Full ROM, no  tenderness in joints Skin: warm and dry, no lesions. Neurological: alert, oriented x3, cranial nerves I-XII grossly intact , normal motor strength, normal sensation. Psychological: normal mood.   CMP Latest Ref Rng & Units 01/28/2018 10/16/2017 07/08/2017  Glucose 65 - 99 mg/dL 131(H) 141(H) 320(H)  BUN 8 - 27 mg/dL 11 11 10   Creatinine 0.76 - 1.27 mg/dL 0.96 0.93 0.99  Sodium 134 - 144 mmol/L 145(H) 139 136  Potassium 3.5 - 5.2 mmol/L 4.3 4.4 4.4  Chloride 96 - 106 mmol/L 107(H) 101 99  CO2 20 - 29 mmol/L 21 23 26   Calcium 8.6 - 10.2 mg/dL 9.6 9.3 9.7  Total Protein 6.0 - 8.5 g/dL - 7.1 8.0  Total Bilirubin 0.0 - 1.2 mg/dL - 0.4 0.4  Alkaline Phos 39 - 117 IU/L - 79 90  AST 0 - 40 IU/L - 17 18  ALT 0 - 44  IU/L - 17 25    Lipid Panel     Component Value Date/Time   CHOL 136 10/16/2017 0909   TRIG 133 10/16/2017 0909   HDL 28 (L) 10/16/2017 0909   CHOLHDL 4.9 10/16/2017 0909   CHOLHDL 3.6 08/08/2015 0845   VLDL 34 (H) 08/08/2015 0845   LDLCALC 81 10/16/2017 0909    CBC    Component Value Date/Time   WBC 9.1 07/08/2017 1541   RBC 4.95 07/08/2017 1541   HGB 13.9 07/08/2017 1541   HCT 42.3 07/08/2017 1541   PLT 302.0 07/08/2017 1541   MCV 85.4 07/08/2017 1541   MCH 28.2 12/19/2016 1430   MCHC 32.9 07/08/2017 1541   RDW 13.9 07/08/2017 1541   LYMPHSABS 3.9 12/06/2010 1638   MONOABS 0.5 12/06/2010 1638   EOSABS 0.4 12/06/2010 1638   BASOSABS 0.1 12/06/2010 1638    Lab Results  Component Value Date   HGBA1C 10.6 (A) 04/28/2018    Assessment & Plan:   1. Type 2 diabetes mellitus with hyperglycemia, with long-term current use of insulin (HCC) Uncontrolled with A1c of 10.6 which has increased from 8.8 previously Victoza added to regimen - POCT glucose (manual entry) - POCT glycosylated hemoglobin (Hb A1C) - liraglutide (VICTOZA) 18 MG/3ML SOPN; 0.6mg  subcut daily for 1 week then 1.2mg  for 1 week then 1.8mg  thereafter  Dispense: 30 mL; Refill: 3  2. 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 Hypothyroidism, unspecified type Controlled Continue levothyroxine  4. Diabetic polyneuropathy associated with type 2 diabetes mellitus (HCC) Exacerbated by poor glycemic control Discussed sedating side effects of gabapentin - gabapentin (NEURONTIN) 300 MG capsule; Take 1 capsule (300 mg total) by mouth at bedtime.  Dispense: 30 capsule; Refill: 3  5. Cough Chronic cough, imaging unrevealing He states cough is related to certain foods but again it is present outside of meal times; denies GERD We will need to exclude underlying pulmonary process Continue MDI, Zyrtec - Pulmonary function test; Future  6. Essential hypertension Uncontrolled Increased carvedilol dose - carvedilol (COREG) 12.5 MG tablet; Take 1 tablet (12.5 mg total) by mouth 2 (two) times daily with a meal.  Dispense: 60 tablet; Refill: 3   Follow-up: Return in about 3 months (around 07/29/2018) for follow up of chronic medical conditions; 2 weeks with Lurena Joiner - DM.       Charlott Rakes, MD, FAAFP. [facility], [place]   04/28/2018, 9:16 AM

## 2018-04-28 NOTE — Patient Instructions (Signed)

## 2018-04-28 NOTE — Progress Notes (Signed)
Patient states that he still hs cough.

## 2018-05-12 ENCOUNTER — Ambulatory Visit: Payer: PRIVATE HEALTH INSURANCE | Admitting: Pharmacist

## 2018-05-29 MED FILL — $LANTUS SOLOSTAR 100 UNITS/: 100 | 90 days supply | Qty: 27 | Fill #2

## 2018-06-03 MED FILL — $LANTUS SOLOSTAR 100 UNITS/: 100 | 90 days supply | Qty: 27 | Fill #3

## 2018-06-09 ENCOUNTER — Other Ambulatory Visit: Payer: Self-pay | Admitting: Pharmacist

## 2018-06-09 DIAGNOSIS — E1165 Type 2 diabetes mellitus with hyperglycemia: Secondary | ICD-10-CM

## 2018-06-09 MED ORDER — INSULIN PEN NEEDLE 31G X 5 MM MISC: 1.0000 | Freq: Every day | 5 refills | Status: DC

## 2018-06-09 MED FILL — TRUEPLUS PEN NDL 31G X 1/4": 31G X 6 MM | 30 days supply | Qty: 100 | Fill #0

## 2018-06-09 MED FILL — TRUEPLUS PEN NDL 31G X 1/4: 31G X 6 MM | 30 days supply | Qty: 100 | Fill #0

## 2018-08-06 MED FILL — LEVOTHYROXINE 175 MCG TAB: 175 | 90 days supply | Qty: 90 | Fill #0

## 2018-08-06 MED FILL — metFORMIN HCL 500 MG TABS: 500 | 90 days supply | Qty: 360 | Fill #2

## 2018-08-06 MED FILL — ALLOPURINOL 300 MG TAB: 300 | 90 days supply | Qty: 90 | Fill #1

## 2018-08-06 MED FILL — AMLODIPINE BESYLATE 10 MG T: 10 | 90 days supply | Qty: 90 | Fill #1

## 2018-08-11 ENCOUNTER — Ambulatory Visit: Payer: PRIVATE HEALTH INSURANCE | Admitting: Family Medicine

## 2018-08-12 ENCOUNTER — Other Ambulatory Visit: Payer: Self-pay

## 2018-08-12 ENCOUNTER — Ambulatory Visit: Payer: PRIVATE HEALTH INSURANCE | Attending: Family Medicine | Admitting: Family Medicine

## 2018-08-12 ENCOUNTER — Encounter: Payer: Self-pay | Admitting: Family Medicine

## 2018-08-12 VITALS — BP 132/84 | HR 84 | Temp 98.6°F | Ht 72.0 in | Wt 268.0 lb

## 2018-08-12 DIAGNOSIS — R252 Cramp and spasm: Secondary | ICD-10-CM

## 2018-08-12 DIAGNOSIS — Z794 Long term (current) use of insulin: Secondary | ICD-10-CM | POA: Diagnosis not present

## 2018-08-12 DIAGNOSIS — E1165 Type 2 diabetes mellitus with hyperglycemia: Secondary | ICD-10-CM | POA: Diagnosis not present

## 2018-08-12 DIAGNOSIS — G4709 Other insomnia: Secondary | ICD-10-CM

## 2018-08-12 DIAGNOSIS — Z1211 Encounter for screening for malignant neoplasm of colon: Secondary | ICD-10-CM

## 2018-08-12 DIAGNOSIS — E1142 Type 2 diabetes mellitus with diabetic polyneuropathy: Secondary | ICD-10-CM

## 2018-08-12 DIAGNOSIS — I10 Essential (primary) hypertension: Secondary | ICD-10-CM

## 2018-08-12 DIAGNOSIS — E039 Hypothyroidism, unspecified: Secondary | ICD-10-CM

## 2018-08-12 DIAGNOSIS — K219 Gastro-esophageal reflux disease without esophagitis: Secondary | ICD-10-CM

## 2018-08-12 LAB — POCT GLYCOSYLATED HEMOGLOBIN (HGB A1C): HbA1c, POC (controlled diabetic range): 10.7 % — AB (ref 0.0–7.0)

## 2018-08-12 MED ORDER — TRAZODONE HCL 50 MG PO TABS: 50.0000 mg | ORAL_TABLET | Freq: Every evening | ORAL | 3 refills | Status: DC | PRN

## 2018-08-12 MED ORDER — LANTUS SOLOSTAR 100 UNIT/ML ~~LOC~~ SOPN: 30.0000 [IU] | PEN_INJECTOR | Freq: Every day | SUBCUTANEOUS | 5 refills | Status: DC

## 2018-08-12 MED ORDER — VICTOZA 18 MG/3ML ~~LOC~~ SOPN: 1.8000 mg | PEN_INJECTOR | Freq: Every day | SUBCUTANEOUS | 3 refills | Status: DC

## 2018-08-12 MED ORDER — CARVEDILOL 12.5 MG PO TABS: 12.5000 mg | ORAL_TABLET | Freq: Two times a day (BID) | ORAL | 1 refills | Status: DC

## 2018-08-12 MED ORDER — LOSARTAN POTASSIUM 100 MG PO TABS: 100.0000 mg | ORAL_TABLET | Freq: Every day | ORAL | 1 refills | Status: DC

## 2018-08-12 MED ORDER — GABAPENTIN 300 MG PO CAPS: 300.0000 mg | ORAL_CAPSULE | Freq: Every day | ORAL | 3 refills | Status: DC

## 2018-08-12 MED ORDER — OMEPRAZOLE 20 MG PO CPDR: 20.0000 mg | DELAYED_RELEASE_CAPSULE | Freq: Every day | ORAL | 1 refills | Status: DC

## 2018-08-12 MED ORDER — METFORMIN HCL 500 MG PO TABS: ORAL_TABLET | ORAL | 1 refills | Status: DC

## 2018-08-12 MED ORDER — TIZANIDINE HCL 4 MG PO TABS: 4.0000 mg | ORAL_TABLET | Freq: Four times a day (QID) | ORAL | 0 refills | Status: DC | PRN

## 2018-08-12 MED FILL — VICTOZA 18 MG/3 ML INJECT P: 18 | 30 days supply | Qty: 9 | Fill #0

## 2018-08-12 MED FILL — CARVEDILOL 12.5 MG TABLET: 12.5 | 30 days supply | Qty: 60 | Fill #0

## 2018-08-12 MED FILL — LOSARTAN POTASSIUM 100 MG T: 100 | 30 days supply | Qty: 30 | Fill #0

## 2018-08-12 MED FILL — tiZANidine HCL 4 MG TABS: 4 | 7 days supply | Qty: 30 | Fill #0

## 2018-08-12 NOTE — Progress Notes (Signed)
Both hands are cramping at times.
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
## 2018-08-13 ENCOUNTER — Ambulatory Visit: Payer: Self-pay | Attending: Family Medicine

## 2018-08-13 DIAGNOSIS — E1165 Type 2 diabetes mellitus with hyperglycemia: Secondary | ICD-10-CM

## 2018-08-13 DIAGNOSIS — E039 Hypothyroidism, unspecified: Secondary | ICD-10-CM

## 2018-08-13 MED FILL — traZODone HCL 50 MG TABS: 50 | 30 days supply | Qty: 30 | Fill #0

## 2018-08-13 MED FILL — GABAPENTIN 300 MG CAPSULE: 300 | 30 days supply | Qty: 30 | Fill #0

## 2018-08-13 MED FILL — ?OMEPRAZOLE 20 MG CAPSULE D: 20 | 30 days supply | Qty: 30 | Fill #0

## 2018-08-14 ENCOUNTER — Encounter: Payer: Self-pay | Admitting: Gastroenterology

## 2018-08-14 ENCOUNTER — Other Ambulatory Visit: Payer: Self-pay | Admitting: Family Medicine

## 2018-08-14 DIAGNOSIS — E039 Hypothyroidism, unspecified: Secondary | ICD-10-CM

## 2018-08-14 LAB — LIPID PANEL
Chol/HDL Ratio: 4.3 ratio (ref 0.0–5.0)
Cholesterol, Total: 130 mg/dL (ref 100–199)
HDL: 30 mg/dL — ABNORMAL LOW (ref >39)
LDL Calculated: 68 mg/dL (ref 0–99)
Triglycerides: 162 mg/dL — ABNORMAL HIGH (ref 0–149)
VLDL Cholesterol Cal: 32 mg/dL (ref 5–40)

## 2018-08-14 LAB — CMP14+EGFR
ALT: 16 IU/L (ref 0–44)
AST: 16 IU/L (ref 0–40)
Albumin/Globulin Ratio: 1.4 (ref 1.2–2.2)
Albumin: 4.2 g/dL (ref 3.8–4.8)
Alkaline Phosphatase: 88 IU/L (ref 39–117)
BUN/Creatinine Ratio: 9 — ABNORMAL LOW (ref 10–24)
BUN: 8 mg/dL (ref 8–27)
Bilirubin Total: 0.3 mg/dL (ref 0.0–1.2)
CO2: 22 mmol/L (ref 20–29)
Calcium: 9.5 mg/dL (ref 8.6–10.2)
Chloride: 103 mmol/L (ref 96–106)
Creatinine, Ser: 0.93 mg/dL (ref 0.76–1.27)
GFR calc Af Amer: 102 mL/min/{1.73_m2} (ref >59)
GFR calc non Af Amer: 88 mL/min/{1.73_m2} (ref >59)
Globulin, Total: 3 g/dL (ref 1.5–4.5)
Glucose: 184 mg/dL — ABNORMAL HIGH (ref 65–99)
Potassium: 4.1 mmol/L (ref 3.5–5.2)
Sodium: 142 mmol/L (ref 134–144)
Total Protein: 7.2 g/dL (ref 6.0–8.5)

## 2018-08-14 LAB — TSH: TSH: 3.37 u[IU]/mL (ref 0.450–4.500)

## 2018-08-14 LAB — T4, FREE: Free T4: 1.09 ng/dL (ref 0.82–1.77)

## 2018-08-14 LAB — MICROALBUMIN / CREATININE URINE RATIO
Creatinine, Urine: 145.1 mg/dL
Microalb/Creat Ratio: 12 mg/g creat (ref 0–29)
Microalbumin, Urine: 17.1 ug/mL

## 2018-08-14 MED ORDER — LEVOTHYROXINE SODIUM 175 MCG PO TABS: ORAL_TABLET | ORAL | 0 refills | Status: DC

## 2018-08-18 ENCOUNTER — Telehealth: Payer: Self-pay | Admitting: Family Medicine

## 2018-08-18 NOTE — Telephone Encounter (Signed)
Patients call returned.  Patient identified by name and date of birth.  Patient states since hae has started new medications had has ben having dizziness spell.  Patiient feels weak as well.    Patient was told that if his condition worsened and/or change for the worse to go to the Emergency Department or Urgent care.  Patient has appointment tomorrow.  Patient acknowledged understanding of advice.

## 2018-08-18 NOTE — Telephone Encounter (Signed)
New Message   Pt states he is having dizziness and feeling light headed. Scheduled an appt for tomorrow at 11:10 but still wanted to follow up with a nurse. Please f/u

## 2018-08-19 ENCOUNTER — Ambulatory Visit: Payer: Self-pay | Attending: Primary Care | Admitting: Primary Care

## 2018-08-19 ENCOUNTER — Encounter: Payer: Self-pay | Admitting: Primary Care

## 2018-08-19 ENCOUNTER — Other Ambulatory Visit: Payer: Self-pay

## 2018-08-19 DIAGNOSIS — E1165 Type 2 diabetes mellitus with hyperglycemia: Secondary | ICD-10-CM

## 2018-08-19 DIAGNOSIS — I10 Essential (primary) hypertension: Secondary | ICD-10-CM

## 2018-08-19 DIAGNOSIS — Z794 Long term (current) use of insulin: Secondary | ICD-10-CM

## 2018-08-19 DIAGNOSIS — R42 Dizziness and giddiness: Secondary | ICD-10-CM

## 2018-08-19 DIAGNOSIS — K219 Gastro-esophageal reflux disease without esophagitis: Secondary | ICD-10-CM

## 2018-08-19 MED ORDER — MECLIZINE HCL 25 MG PO TABS: 25.0000 mg | ORAL_TABLET | Freq: Two times a day (BID) | ORAL | 0 refills | Status: DC | PRN

## 2018-08-19 MED ORDER — MECLIZINE HCL 12.5 MG PO TABS: 25.0000 mg | ORAL_TABLET | Freq: Two times a day (BID) | ORAL | Status: DC | PRN

## 2018-08-19 MED FILL — MECLIZINE 25 MG TABLET: 25 | 15 days supply | Qty: 30 | Fill #0
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
## 2018-08-19 NOTE — Progress Notes (Signed)
Virtual Visit via Telephone Note  I connected with Ray Garner on 08/19/18 at 11:10 AM EDT by telephone and verified that I am speaking with the correct person using two identifiers.   I discussed the limitations, risks, security and privacy concerns of performing an evaluation and management service by telephone and the availability of in person appointments. I also discussed with the patient that there may be a patient responsible charge related to this service. The patient expressed understanding and agreed to proceed.   History of Present Illness: Ray Garner is having a tele visit for new onset of dizziness. Seen by PCP last week and this was not a issue. He  Is unable to identify any causative factors. He is a non compliant type 2 diabetic with hypertension , hypothyroidism and Gout. Any of these co morbidities can contribute to a headache. Also, has complaints of burping and heart burn. Unable to decipher if his stomach hurts more empty or full.   Observations/Objective: Review of Systems  Constitutional: Negative.   HENT: Positive for sore throat.   Eyes: Negative.   Respiratory: Negative.   Cardiovascular: Negative.   Gastrointestinal: Positive for heartburn and nausea.       Burping  Musculoskeletal: Negative.   Skin: Negative.   Neurological: Positive for dizziness and headaches.  Endo/Heme/Allergies: Negative.   Psychiatric/Behavioral: Negative.     Assessment and Plan: Ray Garner was seen today for dizziness and gastroesophageal reflux.  Diagnoses and all orders for this visit:  Dizziness Unclear of etiology : meclizine (ANTIVERT) tablet 25 mg  Gastroesophageal reflux disease without esophagitis Currently on omeprazole and advised to not lay down after eating decrease fried and spicy foods  Type 2 diabetes mellitus with hyperglycemia, with long-term current use of insulin ([place]) The ADA recommends the following therapeutic goals for glycemic control related to A1c  measurements: Goal of therapy: Less than 6.5 hemoglobin A1c.  Reference clinical practice recommendations. Foods that are high in carbohydrates are the following rice, potatoes, breads, sugars, and pastas.  Reduction in the intake (eating) will assist in lowering your blood sugars.  Essential hypertension Denies shortness of breath, headaches, chest pain or lower extremity edema. Counseled on blood pressure goal of less than 130/80, low-sodium, DASH diet, medication compliance, 150 minutes of moderate intensity exercise per week. Discussed medication compliance, adverse effects.  Other orders -     meclizine (ANTIVERT) 25 MG tablet; Take 1 tablet (25 mg total) by mouth 2 (two) times daily as needed for dizziness.  Follow Up Instructions:    I discussed the assessment and treatment plan with the patient. The patient was provided an opportunity to ask questions and all were answered. The patient agreed with the plan and demonstrated an understanding of the instructions.   The patient was advised to call back or seek an in-person evaluation if the symptoms worsen or if the condition fails to improve as anticipated.  I provided 20 minutes of non-face-to-face time during this encounter.   Kerin Perna, NP

## 2018-08-21 ENCOUNTER — Telehealth: Payer: Self-pay

## 2018-08-21 NOTE — Telephone Encounter (Signed)
Patient name and DOB has been verified Patient was informed of lab results. Patient had no questions.  

## 2018-08-21 NOTE — Telephone Encounter (Signed)
-----   Message from Charlott Rakes, MD sent at 08/14/2018  2:50 PM EDT ----- Labs are stable

## 2018-08-25 ENCOUNTER — Telehealth: Payer: Self-pay | Admitting: Family Medicine

## 2018-08-25 NOTE — Telephone Encounter (Signed)
Left message on voicemail for patient to return call. Attempt to f/u with sx's.

## 2018-08-25 NOTE — Telephone Encounter (Signed)
New Message   Pt states that he had an appt (tele) last week on 6/23 but is still having a headache and dizziness. Please f/u

## 2018-08-25 NOTE — Telephone Encounter (Signed)
Patient had a telvisit with Ray Garner. Patient states that he is still having dizzy spells.

## 2018-08-25 NOTE — Telephone Encounter (Signed)
Patient called back stating he would like to know what he should do in regards to his headaches. Please follow up.

## 2018-08-27 NOTE — Telephone Encounter (Signed)
Blurry vision and headaches Headaches are intermittent throughout he day He states medication makes him sleepy but unsure which medication it is. 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 He adds that once before, he felt like this he needed a blood transfusion. Denies chest pain or SOB.  Blood sugars range 119-135 and unable to get BP cuff.   Scheduled an appointment for patient with PCP on 08/28/2018 at 10:00. Pt verbalized understanding.  Patient advised to go to ED if sx's worsen, slurred speech or difficulty with gait or mobility. Advised to monitor blood sugar before going to sleep.

## 2018-08-28 ENCOUNTER — Other Ambulatory Visit: Payer: Self-pay

## 2018-08-28 ENCOUNTER — Ambulatory Visit: Payer: Self-pay

## 2018-08-28 ENCOUNTER — Encounter: Payer: Self-pay | Admitting: Family Medicine

## 2018-08-28 ENCOUNTER — Ambulatory Visit: Payer: Self-pay | Attending: Family Medicine | Admitting: Family Medicine

## 2018-08-28 DIAGNOSIS — R42 Dizziness and giddiness: Secondary | ICD-10-CM

## 2018-08-28 DIAGNOSIS — R51 Headache: Secondary | ICD-10-CM

## 2018-08-28 DIAGNOSIS — R519 Headache, unspecified: Secondary | ICD-10-CM

## 2018-08-28 MED ORDER — CETIRIZINE HCL 10 MG PO TABS: 10.0000 mg | ORAL_TABLET | Freq: Every day | ORAL | 1 refills | Status: DC

## 2018-08-28 MED ORDER — FLUTICASONE PROPIONATE 50 MCG/ACT NA SUSP: 2.0000 | Freq: Every day | NASAL | 1 refills | Status: DC

## 2018-08-28 MED FILL — ?CETIRIZINE HCL 10 MG TABLE: 10 | 30 days supply | Qty: 30 | Fill #0

## 2018-08-28 MED FILL — FLUTICASONE PROP 50 MCG SPR: 50 | 30 days supply | Qty: 16 | Fill #0

## 2018-08-28 MED FILL — !LANTUS SOLOSTAR 100UNITS/M: 100 | 20 days supply | Qty: 6 | Fill #4

## 2018-08-28 NOTE — Progress Notes (Signed)
Patient has been called and DOB has been verified. Patient has been screened and transferred to PCP to start phone visit.     

## 2018-08-28 NOTE — Progress Notes (Signed)
Virtual Visit via Telephone Note  I connected with [name], on 08/28/2018 at 10:48 AM by telephone due to the COVID-19 pandemic and verified that I am speaking with the correct person using two identifiers.   Consent: I discussed the limitations, risks, security and privacy concerns of performing an evaluation and management service by telephone and the availability of in person appointments. I also discussed with the patient that there may be a patient responsible charge related to this service. The patient expressed understanding and agreed to proceed.   Location of Patient: Home  Location of Provider: Clinic   Persons participating in Telemedicine visit: Shamarion Coots [place]-CMA Dr. Felecia Shelling     History of Present Illness: Ray Garner  is a 63 year old male with history of type 2 diabetes mellitus (A1c 10.7), hypertension, hypothyroidism, Gout who presents for an acute visit. He complains of dizziness which he has had for the last 2 weeks and also headaches which are rated at a 7/10 on the superior aspect of his head.  Dizziness occurs to the point where he almost fell and he has noticed it while driving and has to blink his eyes on several occasions in order to focus.  He denies the room spinning, denies tinnitus or hearing loss, denies nausea or vomiting. He was prescribed meclizine during his telehealth visit with the nurse practitioner last week but complains of sedation with meclizine. Headaches are intermittent and he endorses pressure on his nose but denies sinus congestion, postnasal drip, cough, fever, myalgias. He checks his blood pressure at this morning and it was 139/94.   Past Medical History:  Diagnosis Date  . Arthritis   . Back pain, lumbosacral 06/30/2013  . DM type 2 (diabetes mellitus, type 2) ([place]) 06/30/2013  . Gout   . HTN (hypertension) 06/30/2013  . Hypertension   . Hypothyroid 06/30/2013   Allergies  Allergen Reactions  . Vicodin  [Hydrocodone-Acetaminophen] Hives and Rash    Current Outpatient Medications on File Prior to Visit  Medication Sig Dispense Refill  . albuterol (PROVENTIL HFA;VENTOLIN HFA) 108 (90 Base) MCG/ACT inhaler Inhale 1-2 puffs into the lungs every 6 (six) hours as needed for wheezing or shortness of breath. 1 Inhaler 0  . allopurinol (ZYLOPRIM) 300 MG tablet Take 1 tablet (300 mg total) by mouth daily. 90 tablet 1  . carvedilol (COREG) 12.5 MG tablet Take 1 tablet (12.5 mg total) by mouth 2 (two) times daily with a meal. 180 tablet 1  . Continuous Blood Gluc Receiver (FREESTYLE LIBRE 14 DAY READER) DEVI 1 each by Does not apply route 3 (three) times daily with meals. 1 Device 0  . Continuous Blood Gluc Sensor (FREESTYLE LIBRE 14 DAY SENSOR) MISC 1 each by Does not apply route 3 (three) times daily with meals. 1 each 11  . gabapentin (NEURONTIN) 300 MG capsule Take 1 capsule (300 mg total) by mouth at bedtime. 30 capsule 3  . glucose blood (BAYER CONTOUR TEST) test strip Use as instructed 100 each 12  . Insulin Glargine (LANTUS SOLOSTAR) 100 UNIT/ML Solostar Pen Inject 30 Units into the skin daily. 5 pen 5  . Insulin Pen Needle 31G X 5 MM MISC 1 each by Does not apply route at bedtime. 100 each 5  . levothyroxine (SYNTHROID) 175 MCG tablet TAKE 1 TABLET BY MOUTH ONCE DAILY BEFORE BREAKFAST 90 tablet 0  . liraglutide (VICTOZA) 18 MG/3ML SOPN Inject 0.3 mLs (1.8 mg total) into the skin daily with breakfast. 0.6mg  subcut daily for 1  week then 1.2mg  for 1 week then 1.8mg  thereafter 30 mL 3  . losartan (COZAAR) 100 MG tablet Take 1 tablet (100 mg total) by mouth daily. 90 tablet 1  . meclizine (ANTIVERT) 25 MG tablet Take 1 tablet (25 mg total) by mouth 2 (two) times daily as needed for dizziness. 30 tablet 0  . metFORMIN (GLUCOPHAGE) 500 MG tablet TAKE 2 TABLETS BY MOUTH TWICE DAILY WITH A MEAL 180 tablet 1  . NON FORMULARY     . omeprazole (PRILOSEC) 20 MG capsule Take 1 capsule (20 mg total) by mouth  daily. 90 capsule 1  . Spacer/Aero-Holding Chambers (AEROCHAMBER PLUS) inhaler Use as instructed 1 each 2  . tiZANidine (ZANAFLEX) 4 MG tablet Take 1 tablet (4 mg total) by mouth every 6 (six) hours as needed for muscle spasms. 30 tablet 0  . traZODone (DESYREL) 50 MG tablet Take 1 tablet (50 mg total) by mouth at bedtime as needed for sleep. 30 tablet 3  . vitamin B-12 (CYANOCOBALAMIN) 1000 MCG tablet Take 1,000 mcg by mouth daily.     No current facility-administered medications on file prior to visit.     Observations/Objective: Awake, alert, oriented x3 Not in acute distress  Assessment and Plan: 1. Vertigo Advised to change positions slowly Continue meclizine and use at night due to sedation We will need to exclude anemia given persisting dizziness - CBC with Differential/Platelet  2. Sinus headache We will treat presumptively for sinus symptoms given concomitant of dizziness and headache - fluticasone (FLONASE) 50 MCG/ACT nasal spray; Place 2 sprays into both nostrils daily.  Dispense: 16 g; Refill: 1 - cetirizine (ZYRTEC) 10 MG tablet; Take 1 tablet (10 mg total) by mouth daily.  Dispense: 30 tablet; Refill: 1   Follow Up Instructions: Return for Follow-up, keep previously scheduled appointment.    I discussed the assessment and treatment plan with the patient. The patient was provided an opportunity to ask questions and all were answered. The patient agreed with the plan and demonstrated an understanding of the instructions.   The patient was advised to call back or seek an in-person evaluation if the symptoms worsen or if the condition fails to improve as anticipated.     I provided 15 minutes total of non-face-to-face time during this encounter including median intraservice time, reviewing previous notes, labs, imaging, medications, management and patient verbalized understanding.     Charlott Rakes, MD, FAAFP. [facility] [place] [place]   08/28/2018, 10:48 AM

## 2018-08-29 LAB — CBC WITH DIFFERENTIAL/PLATELET
Basophils Absolute: 0 10*3/uL (ref 0.0–0.2)
Basos: 0 %
EOS (ABSOLUTE): 0.2 10*3/uL (ref 0.0–0.4)
Eos: 2 %
Hematocrit: 39 % (ref 37.5–51.0)
Hemoglobin: 13.2 g/dL (ref 13.0–17.7)
Immature Grans (Abs): 0 10*3/uL (ref 0.0–0.1)
Immature Granulocytes: 0 %
Lymphocytes Absolute: 3.6 10*3/uL — ABNORMAL HIGH (ref 0.7–3.1)
Lymphs: 35 %
MCH: 28.3 pg (ref 26.6–33.0)
MCHC: 33.8 g/dL (ref 31.5–35.7)
MCV: 84 fL (ref 79–97)
Monocytes Absolute: 0.5 10*3/uL (ref 0.1–0.9)
Monocytes: 5 %
Neutrophils Absolute: 6 10*3/uL (ref 1.4–7.0)
Neutrophils: 58 %
Platelets: 274 10*3/uL (ref 150–450)
RBC: 4.67 x10E6/uL (ref 4.14–5.80)
RDW: 13.4 % (ref 11.6–15.4)
WBC: 10.4 10*3/uL (ref 3.4–10.8)

## 2018-09-01 ENCOUNTER — Telehealth: Payer: Self-pay

## 2018-09-01 NOTE — Telephone Encounter (Signed)
Patient name and DOB has been verified Patient was informed of lab results. Patient had no questions.  

## 2018-09-01 NOTE — Telephone Encounter (Signed)
-----   Message from Charlott Rakes, MD sent at 09/01/2018  8:18 AM EDT ----- Labs are normal and negative for anemia

## 2018-09-11 ENCOUNTER — Encounter: Payer: PRIVATE HEALTH INSURANCE | Admitting: Gastroenterology

## 2018-09-18 ENCOUNTER — Other Ambulatory Visit: Payer: Self-pay | Admitting: Pharmacist

## 2018-09-18 DIAGNOSIS — E1165 Type 2 diabetes mellitus with hyperglycemia: Secondary | ICD-10-CM

## 2018-09-18 MED ORDER — LANTUS SOLOSTAR 100 UNIT/ML ~~LOC~~ SOPN: 30.0000 [IU] | PEN_INJECTOR | Freq: Every day | SUBCUTANEOUS | 0 refills | Status: DC

## 2018-09-18 MED FILL — $LANTUS SOLOSTAR 100 UNITS/: 100 | 90 days supply | Qty: 27 | Fill #0

## 2018-09-18 MED FILL — VICTOZA 18 MG/3 ML INJECT P: 18 | 30 days supply | Qty: 9 | Fill #1

## 2018-10-01 ENCOUNTER — Other Ambulatory Visit: Payer: Self-pay

## 2018-10-01 ENCOUNTER — Ambulatory Visit (AMBULATORY_SURGERY_CENTER): Payer: Self-pay | Admitting: *Deleted

## 2018-10-01 VITALS — Temp 96.6°F | Ht 72.0 in | Wt 275.2 lb

## 2018-10-01 DIAGNOSIS — Z1211 Encounter for screening for malignant neoplasm of colon: Secondary | ICD-10-CM

## 2018-10-01 MED ORDER — PEG 3350-KCL-NA BICARB-NACL 420 G PO SOLR: 4000.0000 mL | Freq: Once | ORAL | 0 refills | Status: AC

## 2018-10-01 MED FILL — PEG-3350 AND ELECTROLYTES S: 236 | 1 days supply | Qty: 4000 | Fill #0

## 2018-10-01 NOTE — Progress Notes (Signed)
No egg or soy allergy known to patient  No issues with past sedation with any surgeries  or procedures, no intubation problems  No diet pills per patient No home 02 use per patient  No blood thinners per patient  Pt denies issues with constipation  No A fib or A flutter  EMMI video sent to pt's e mail  

## 2018-10-15 ENCOUNTER — Telehealth: Payer: Self-pay | Admitting: Gastroenterology

## 2018-10-15 NOTE — Telephone Encounter (Signed)

## 2018-10-16 ENCOUNTER — Encounter: Payer: Self-pay | Admitting: Gastroenterology

## 2018-10-16 ENCOUNTER — Other Ambulatory Visit: Payer: Self-pay

## 2018-10-16 ENCOUNTER — Ambulatory Visit (AMBULATORY_SURGERY_CENTER): Payer: PRIVATE HEALTH INSURANCE | Admitting: Gastroenterology

## 2018-10-16 VITALS — BP 143/97 | HR 73 | Temp 97.5°F | Resp 16 | Ht 72.0 in | Wt 275.0 lb

## 2018-10-16 DIAGNOSIS — Z1211 Encounter for screening for malignant neoplasm of colon: Secondary | ICD-10-CM

## 2018-10-16 DIAGNOSIS — D125 Benign neoplasm of sigmoid colon: Secondary | ICD-10-CM

## 2018-10-16 DIAGNOSIS — K635 Polyp of colon: Secondary | ICD-10-CM

## 2018-10-16 DIAGNOSIS — D122 Benign neoplasm of ascending colon: Secondary | ICD-10-CM

## 2018-10-16 MED ORDER — SODIUM CHLORIDE 0.9 % IV SOLN: 500.0000 mL | Freq: Once | INTRAVENOUS | Status: DC

## 2018-10-16 NOTE — Progress Notes (Signed)
Called to room to assist during endoscopic procedure.  Patient ID and intended procedure confirmed with present staff. Received instructions for my participation in the procedure from the performing physician.  

## 2018-10-16 NOTE — Op Note (Signed)
Ray Garner Procedure Date: 10/16/2018 10:43 AM MRN: [id number] Endoscopist: Mauri Pole , MD Age: 63 Referring MD:  Date of Birth: Mar 13, 1955 Gender: Male Account #: [id number] Procedure:                Colonoscopy Indications:              Screening for colorectal malignant neoplasm Medicines:                Monitored Anesthesia Care Procedure:                Pre-Anesthesia Assessment:                           - Prior to the procedure, a History and Physical                            was performed, and patient medications and                            allergies were reviewed. The patient's tolerance of                            previous anesthesia was also reviewed. The risks                            and benefits of the procedure and the sedation                            options and risks were discussed with the patient.                            All questions were answered, and informed consent                            was obtained. Prior Anticoagulants: The patient has                            taken no previous anticoagulant or antiplatelet                            agents. ASA Grade Assessment: III - A patient with                            severe systemic disease. After reviewing the risks                            and benefits, the patient was deemed in                            satisfactory condition to undergo the procedure.                           After obtaining informed consent, the colonoscope  was passed under direct vision. Throughout the                            procedure, the patient's blood pressure, pulse, and                            oxygen saturations were monitored continuously. The                            Colonoscope was introduced through the anus and                            advanced to the the cecum, identified by                            appendiceal orifice  and ileocecal valve. The                            colonoscopy was performed without difficulty. The                            patient tolerated the procedure well. The quality                            of the bowel preparation was adequate. The                            ileocecal valve, appendiceal orifice, and rectum                            were photographed. Scope In: 10:51:58 AM Scope Out: 11:13:40 AM Scope Withdrawal Time: 0 hours 12 minutes 57 seconds  Total Procedure Duration: 0 hours 21 minutes 42 seconds  Findings:                 The perianal and digital rectal examinations were                            normal.                           Two sessile polyps were found in the sigmoid colon                            and ascending colon. The polyps were 1 to 2 mm in                            size. These polyps were removed with a cold biopsy                            forceps. Resection and retrieval were complete.                           A few small-mouthed diverticula were found in the  sigmoid colon and descending colon.                           Non-bleeding internal hemorrhoids were found during                            retroflexion. The hemorrhoids were medium-sized.                           The exam was otherwise without abnormality. Complications:            No immediate complications. Estimated Blood Loss:     Estimated blood loss was minimal. Impression:               - Two 1 to 2 mm polyps in the sigmoid colon and in                            the ascending colon, removed with a cold biopsy                            forceps. Resected and retrieved.                           - Diverticulosis in the sigmoid colon and in the                            descending colon.                           - Non-bleeding internal hemorrhoids.                           - The examination was otherwise normal. Recommendation:           - Patient  has a contact number available for                            emergencies. The signs and symptoms of potential                            delayed complications were discussed with the                            patient. Return to normal activities tomorrow.                            Written discharge instructions were provided to the                            patient.                           - Resume previous diet.                           - Continue present medications.                           -  Await pathology results.                           - Repeat colonoscopy in 5 years for surveillance                            based on pathology results.                           - For future colonoscopy the patient will require                            an extended preparation. If there are any                            questions, please contact the gastroenterologist. Mauri Pole, MD 10/16/2018 11:24:35 AM This report has been signed electronically.

## 2018-10-16 NOTE — Patient Instructions (Signed)
Please read handouts provided. Await pathology results. Continue present medications.      YOU HAD AN ENDOSCOPIC PROCEDURE TODAY AT THE [facility]:   Refer to the procedure report that was given to you for any specific questions about what was found during the examination.  If the procedure report does not answer your questions, please call your gastroenterologist to clarify.  If you requested that your care partner not be given the details of your procedure findings, then the procedure report has been included in a sealed envelope for you to review at your convenience later.  YOU SHOULD EXPECT: Some feelings of bloating in the abdomen. Passage of more gas than usual.  Walking can help get rid of the air that was put into your GI tract during the procedure and reduce the bloating. If you had a lower endoscopy (such as a colonoscopy or flexible sigmoidoscopy) you may notice spotting of blood in your stool or on the toilet paper. If you underwent a bowel prep for your procedure, you may not have a normal bowel movement for a few days.  Please Note:  You might notice some irritation and congestion in your nose or some drainage.  This is from the oxygen used during your procedure.  There is no need for concern and it should clear up in a day or so.  SYMPTOMS TO REPORT IMMEDIATELY:   Following lower endoscopy (colonoscopy or flexible sigmoidoscopy):  Excessive amounts of blood in the stool  Significant tenderness or worsening of abdominal pains  Swelling of the abdomen that is new, acute  Fever of 100F or higher    For urgent or emergent issues, a gastroenterologist can be reached at any hour by calling [phone].   DIET:  We do recommend a small meal at first, but then you may proceed to your regular diet.  Drink plenty of fluids but you should avoid alcoholic beverages for 24 hours.  ACTIVITY:  You should plan to take it easy for the rest of today and you should NOT  DRIVE or use heavy machinery until tomorrow (because of the sedation medicines used during the test).    FOLLOW UP: Our staff will call the number listed on your records 48-72 hours following your procedure to check on you and address any questions or concerns that you may have regarding the information given to you following your procedure. If we do not reach you, we will leave a message.  We will attempt to reach you two times.  During this call, we will ask if you have developed any symptoms of COVID 19. If you develop any symptoms (ie: fever, flu-like symptoms, shortness of breath, cough etc.) before then, please call [phone].  If you test positive for Covid 19 in the 2 weeks post procedure, please call and report this information to us.    If any biopsies were taken you will be contacted by phone or by letter within the next 1-3 weeks.  Please call us at [phone] if you have not heard about the biopsies in 3 weeks.    SIGNATURES/CONFIDENTIALITY: You and/or your care partner have signed paperwork which will be entered into your electronic medical record.  These signatures attest to the fact that that the information above on your After Visit Summary has been reviewed and is understood.  Full responsibility of the confidentiality of this discharge information lies with you and/or your care-partner. 

## 2018-10-20 ENCOUNTER — Telehealth: Payer: Self-pay | Admitting: *Deleted

## 2018-10-20 NOTE — Telephone Encounter (Signed)
  Follow up Call-  Call back number 10/16/2018  Post procedure Call Back phone  # [phone]  Permission to leave phone message Yes  Some recent data might be hidden     Patient questions:  Message left to call us if necessary. Second call.

## 2018-10-20 NOTE — Telephone Encounter (Signed)
  Follow up Call-  Call back number 10/16/2018  Post procedure Call Back phone  # [phone]  Permission to leave phone message Yes  Some recent data might be hidden     Patient questions:  Message left to call us if necessary.

## 2018-10-22 MED FILL — !VICTOZA 18MG/3ML INJECT: 18 | 30 days supply | Qty: 9 | Fill #2

## 2018-10-22 MED FILL — LOSARTAN POTASSIUM 100 MG T: 100 | 30 days supply | Qty: 30 | Fill #1

## 2018-10-24 ENCOUNTER — Encounter: Payer: Self-pay | Admitting: Gastroenterology

## 2018-11-12 ENCOUNTER — Encounter: Payer: Self-pay | Admitting: Family Medicine

## 2018-11-12 ENCOUNTER — Other Ambulatory Visit: Payer: Self-pay

## 2018-11-12 ENCOUNTER — Ambulatory Visit: Payer: PRIVATE HEALTH INSURANCE | Attending: Family Medicine | Admitting: Family Medicine

## 2018-11-12 VITALS — BP 154/99 | HR 81 | Temp 98.2°F | Ht 72.0 in | Wt 274.0 lb

## 2018-11-12 DIAGNOSIS — I10 Essential (primary) hypertension: Secondary | ICD-10-CM

## 2018-11-12 DIAGNOSIS — Z794 Long term (current) use of insulin: Secondary | ICD-10-CM | POA: Diagnosis not present

## 2018-11-12 DIAGNOSIS — R21 Rash and other nonspecific skin eruption: Secondary | ICD-10-CM

## 2018-11-12 DIAGNOSIS — E1165 Type 2 diabetes mellitus with hyperglycemia: Secondary | ICD-10-CM

## 2018-11-12 DIAGNOSIS — E039 Hypothyroidism, unspecified: Secondary | ICD-10-CM

## 2018-11-12 DIAGNOSIS — M1A9XX0 Chronic gout, unspecified, without tophus (tophi): Secondary | ICD-10-CM

## 2018-11-12 LAB — POCT GLYCOSYLATED HEMOGLOBIN (HGB A1C): HbA1c, POC (controlled diabetic range): 9.6 % — AB (ref 0.0–7.0)

## 2018-11-12 LAB — GLUCOSE, POCT (MANUAL RESULT ENTRY): POC Glucose: 156 mg/dl — AB (ref 70–99)

## 2018-11-12 MED ORDER — HYDROCORTISONE 1 % EX OINT: 1.0000 "application " | TOPICAL_OINTMENT | Freq: Two times a day (BID) | CUTANEOUS | 1 refills | Status: AC

## 2018-11-12 MED ORDER — LANTUS SOLOSTAR 100 UNIT/ML ~~LOC~~ SOPN: 35.0000 [IU] | PEN_INJECTOR | Freq: Every day | SUBCUTANEOUS | 6 refills | Status: DC

## 2018-11-12 MED ORDER — VICTOZA 18 MG/3ML ~~LOC~~ SOPN: 1.8000 mg | PEN_INJECTOR | Freq: Every day | SUBCUTANEOUS | 3 refills | Status: DC

## 2018-11-12 MED ORDER — LEVOTHYROXINE SODIUM 175 MCG PO TABS: ORAL_TABLET | ORAL | 0 refills | Status: DC

## 2018-11-12 MED ORDER — ALLOPURINOL 300 MG PO TABS: 300.0000 mg | ORAL_TABLET | Freq: Every day | ORAL | 1 refills | Status: DC

## 2018-11-12 MED FILL — LEVOTHYROXINE 175 MCG TAB: 175 | 30 days supply | Qty: 30 | Fill #0

## 2018-11-12 MED FILL — ?ALLOPURINOL 300 MG TABLET: 300 | 30 days supply | Qty: 30 | Fill #0

## 2018-11-12 NOTE — Progress Notes (Signed)
Patient has red bumps on arms that he is concerned about.

## 2018-11-12 NOTE — Progress Notes (Signed)
Established Patient Office Visit  Subjective:  Patient ID: Ray Garner, male    DOB: 1956-01-29  Age: 63 y.o. MRN: [id number]  CC:  Chief Complaint  Patient presents with  . Diabetes    HPI Ray Garner is a 63 year old male with a history of type 2 diabetes mellitus (A1c 9.6), hypertension, hypothyroidism, and gout who presents today for a follow up visit.  His blood pressure is somewhat elevated today but was within acceptable limits at his last visit and when he checks it at home.  His diabetes is uncontrolled but his A1c is somewhat improved from his last visit and he endorses compliance with medications.  He checks his blood sugar at home with postprandial blood sugar ranging 140-200.  His last eye exam was 6 weeks ago, due for a diabetic foot exam today.  He denies recent gout flairs and takes medication as prescribed for prevention.  Compliant with levothyroxine and denies unexpected weight changes, constipation, and diarrhea.  He does complain today of a diffuse rash on bilateral forearms that has persisted for one month.  He states that he does a lot of yard work and he thinks that may be the cause.  Patient endorses constant pruritis.  No open areas or drainage noted.  He has tried calamine lotion with no relief.  He has also noted two circular spots on his left shin that have been present for one month.  He first noticed them after working outside without socks on and he believed that they are spider bites.  Denies current drainage but endorses pain and pruritus.  He confirms that he scratches them regularly.  Past Medical History:  Diagnosis Date  . Anemia   . Arthritis   . Back pain, lumbosacral 06/30/2013  . Blood transfusion without reported diagnosis   . Cataract    BEGINNING  . DM type 2 (diabetes mellitus, type 2) ([place]) 06/30/2013  . Gout   . HTN (hypertension) 06/30/2013  . Hypertension   . Hypothyroid 06/30/2013    Past Surgical History:  Procedure Laterality Date  .  CYST EXCISION    . FRACTURE SURGERY      Family History  Problem Relation Age of Onset  . Breast cancer Niece   . Colon cancer Neg Hx   . Colon polyps Neg Hx   . Esophageal cancer Neg Hx   . Rectal cancer Neg Hx   . Stomach cancer Neg Hx     Social History   Socioeconomic History  . Marital status: Divorced    Spouse name: Not on file  . Number of children: Not on file  . Years of education: Not on file  . Highest education level: Not on file  Occupational History  . Not on file  Social Needs  . Financial resource strain: Not on file  . Food insecurity    Worry: Not on file    Inability: Not on file  . Transportation needs    Medical: Not on file    Non-medical: Not on file  Tobacco Use  . Smoking status: Former Smoker    Quit date: 05/14/1983    Years since quitting: 35.5  . Smokeless tobacco: Former Network engineer and Sexual Activity  . Alcohol use: Yes    Comment: OCC.  . Drug use: No  . Sexual activity: Not on file  Lifestyle  . Physical activity    Days per week: Not on file    Minutes per session: Not on file  .  Stress: Not on file  Relationships  . Social Herbalist on phone: Not on file    Gets together: Not on file    Attends religious service: Not on file    Active member of club or organization: Not on file    Attends meetings of clubs or organizations: Not on file    Relationship status: Not on file  . Intimate partner violence    Fear of current or ex partner: Not on file    Emotionally abused: Not on file    Physically abused: Not on file    Forced sexual activity: Not on file  Other Topics Concern  . Not on file  Social History Narrative  . Not on file    Outpatient Medications Prior to Visit  Medication Sig Dispense Refill  . amLODipine (NORVASC) 10 MG tablet Take 10 mg by mouth daily.    . carvedilol (COREG) 12.5 MG tablet Take 1 tablet (12.5 mg total) by mouth 2 (two) times daily with a meal. 180 tablet 1  . Continuous  Blood Gluc Receiver (FREESTYLE LIBRE 14 DAY READER) DEVI 1 each by Does not apply route 3 (three) times daily with meals. 1 Device 0  . Continuous Blood Gluc Sensor (FREESTYLE LIBRE 14 DAY SENSOR) MISC 1 each by Does not apply route 3 (three) times daily with meals. 1 each 11  . glucose blood (BAYER CONTOUR TEST) test strip Use as instructed 100 each 12  . Insulin Pen Needle 31G X 5 MM MISC 1 each by Does not apply route at bedtime. 100 each 5  . losartan (COZAAR) 100 MG tablet Take 1 tablet (100 mg total) by mouth daily. 90 tablet 1  . metFORMIN (GLUCOPHAGE) 500 MG tablet TAKE 2 TABLETS BY MOUTH TWICE DAILY WITH A MEAL 180 tablet 1  . omeprazole (PRILOSEC) 20 MG capsule Take 1 capsule (20 mg total) by mouth daily. 90 capsule 1  . vitamin B-12 (CYANOCOBALAMIN) 1000 MCG tablet Take 1,000 mcg by mouth daily.    Marland Kitchen allopurinol (ZYLOPRIM) 300 MG tablet Take 1 tablet (300 mg total) by mouth daily. 90 tablet 1  . Insulin Glargine (LANTUS SOLOSTAR) 100 UNIT/ML Solostar Pen Inject 30 Units into the skin daily. 27 mL 0  . levothyroxine (SYNTHROID) 175 MCG tablet TAKE 1 TABLET BY MOUTH ONCE DAILY BEFORE BREAKFAST 90 tablet 0  . liraglutide (VICTOZA) 18 MG/3ML SOPN Inject 0.3 mLs (1.8 mg total) into the skin daily with breakfast. 0.6mg  subcut daily for 1 week then 1.2mg  for 1 week then 1.8mg  thereafter 30 mL 3  . albuterol (PROVENTIL HFA;VENTOLIN HFA) 108 (90 Base) MCG/ACT inhaler Inhale 1-2 puffs into the lungs every 6 (six) hours as needed for wheezing or shortness of breath. (Patient not taking: Reported on 10/16/2018) 1 Inhaler 0  . cetirizine (ZYRTEC) 10 MG tablet Take 1 tablet (10 mg total) by mouth daily. (Patient not taking: Reported on 10/16/2018) 30 tablet 1  . fluticasone (FLONASE) 50 MCG/ACT nasal spray Place 2 sprays into both nostrils daily. (Patient not taking: Reported on 10/16/2018) 16 g 1  . gabapentin (NEURONTIN) 300 MG capsule Take 1 capsule (300 mg total) by mouth at bedtime. (Patient not  taking: Reported on 10/16/2018) 30 capsule 3  . meclizine (ANTIVERT) 25 MG tablet Take 1 tablet (25 mg total) by mouth 2 (two) times daily as needed for dizziness. (Patient not taking: Reported on 10/16/2018) 30 tablet 0  . Spacer/Aero-Holding Chambers (AEROCHAMBER PLUS) inhaler Use as instructed (Patient  not taking: Reported on 10/16/2018) 1 each 2  . tiZANidine (ZANAFLEX) 4 MG tablet Take 1 tablet (4 mg total) by mouth every 6 (six) hours as needed for muscle spasms. (Patient not taking: Reported on 10/01/2018) 30 tablet 0  . traZODone (DESYREL) 50 MG tablet Take 1 tablet (50 mg total) by mouth at bedtime as needed for sleep. (Patient not taking: Reported on 10/16/2018) 30 tablet 3   No facility-administered medications prior to visit.     Allergies  Allergen Reactions  . Vicodin [Hydrocodone-Acetaminophen] Hives and Rash    ROS Review of Systems  Constitutional: Negative for fatigue, fever and unexpected weight change.  HENT: Negative for congestion, rhinorrhea, sinus pressure and sinus pain.   Eyes: Negative for visual disturbance.  Respiratory: Negative for cough, chest tightness and shortness of breath.   Cardiovascular: Negative for chest pain, palpitations and leg swelling.  Gastrointestinal: Negative for abdominal distention, abdominal pain, constipation, diarrhea, nausea and vomiting.  Endocrine: Negative for polydipsia and polyuria.  Genitourinary: Negative for decreased urine volume, difficulty urinating and dysuria.  Musculoskeletal: Negative for arthralgias and myalgias.  Skin: Positive for rash. Negative for color change.       Bilateral forearms Two circular wounds to LLE  Neurological: Negative for dizziness, tremors, weakness and numbness.  Hematological: Does not bruise/bleed easily.  Psychiatric/Behavioral: Negative for agitation and behavioral problems.      Objective:    Physical Exam  Constitutional: He is oriented to person, place, and time. He appears  well-developed and well-nourished. No distress.  HENT:  Head: Normocephalic.  Eyes: Pupils are equal, round, and reactive to light. EOM are normal.  Neck: Normal range of motion. Neck supple. No thyromegaly present.  Cardiovascular: Normal rate, regular rhythm, normal heart sounds and intact distal pulses.  No murmur heard. Pulmonary/Chest: Effort normal and breath sounds normal. No respiratory distress.  Abdominal: Soft. Bowel sounds are normal. He exhibits no distension. There is no abdominal tenderness.  Musculoskeletal: Normal range of motion.  Lymphadenopathy:    He has no cervical adenopathy.  Neurological: He is alert and oriented to person, place, and time.  Skin: Skin is warm and dry. Rash noted. Rash is papular.       BP (!) 154/99   Pulse 81   Temp 98.2 F (36.8 C) (Oral)   Ht 6' (1.829 m)   Wt 274 lb (124.3 kg)   SpO2 98%   BMI 37.16 kg/m  Wt Readings from Last 3 Encounters:  11/12/18 274 lb (124.3 kg)  10/16/18 275 lb (124.7 kg)  10/01/18 275 lb 3.2 oz (124.8 kg)     Health Maintenance Due  Topic Date Due  . OPHTHALMOLOGY EXAM  10/09/1966  . FOOT EXAM  12/17/2017  . INFLUENZA VACCINE  09/27/2018    There are no preventive care reminders to display for this patient.  Lab Results  Component Value Date   TSH 3.370 08/13/2018   Lab Results  Component Value Date   WBC 10.4 08/28/2018   HGB 13.2 08/28/2018   HCT 39.0 08/28/2018   MCV 84 08/28/2018   PLT 274 08/28/2018   Lab Results  Component Value Date   NA 142 08/13/2018   K 4.1 08/13/2018   CO2 22 08/13/2018   GLUCOSE 184 (H) 08/13/2018   BUN 8 08/13/2018   CREATININE 0.93 08/13/2018   BILITOT 0.3 08/13/2018   ALKPHOS 88 08/13/2018   AST 16 08/13/2018   ALT 16 08/13/2018   PROT 7.2 08/13/2018   ALBUMIN  4.2 08/13/2018   CALCIUM 9.5 08/13/2018   ANIONGAP 12 12/19/2016   GFR 98.92 07/08/2017   Lab Results  Component Value Date   CHOL 130 08/13/2018   Lab Results  Component Value  Date   HDL 30 (L) 08/13/2018   Lab Results  Component Value Date   LDLCALC 68 08/13/2018   Lab Results  Component Value Date   TRIG 162 (H) 08/13/2018   Lab Results  Component Value Date   CHOLHDL 4.3 08/13/2018   Lab Results  Component Value Date   HGBA1C 9.6 (A) 11/12/2018      Assessment & Plan:   1. Type 2 diabetes mellitus with hyperglycemia, with long-term current use of insulin (HCC) Uncontrolled with an A1c of 9.6 Counseled on Diabetic diet, my plate method, [id number] minutes of moderate intensity exercise/week Keep blood sugar logs with fasting goals of 80-120 mg/dl, random of less than 180 and in the event of sugars less than 60 mg/dl or greater than 400 mg/dl please notify the clinic ASAP. Last urine microalbumin 07/2018 Last eye exam 09/2018 Diabetic foot exam completed today Continue metformin. Increase daily Lantus dose from 30 units to 35 units. - POCT glucose (manual entry) - POCT glycosylated hemoglobin (Hb A1C) - Insulin Glargine (LANTUS SOLOSTAR) 100 UNIT/ML Solostar Pen; Inject 35 Units into the skin daily.  Dispense: 30 mL; Refill: 6 - liraglutide (VICTOZA) 18 MG/3ML SOPN; Inject 0.3 mLs (1.8 mg total) into the skin daily with breakfast.  Dispense: 30 mL; Refill: 3  2. Chronic gout without tophus, unspecified cause, unspecified site Stable Continue to avoid foods high in purines Exercise as tolerated Counseled about the importance of a healthy weight. - allopurinol (ZYLOPRIM) 300 MG tablet; Take 1 tablet (300 mg total) by mouth daily.  Dispense: 90 tablet; Refill: 1  3. Acquired hypothyroidism Stable Recheck thyroid levels in 3 months - levothyroxine (SYNTHROID) 175 MCG tablet; TAKE 1 TABLET BY MOUTH ONCE DAILY BEFORE BREAKFAST  Dispense: 90 tablet; Refill: 0  4. Rash and nonspecific skin eruption New diagnosis Educated on the importance of avoiding scratching and to avoid substances that exacerbate rash.  Wear protective clothing as possible. Begin  hydrocortisone 1% applied to affected area twice daily  5. Essential hypertension Stable Blood pressure is elevated today but is within the acceptable range at home and at prior visit.  Instructed patient to bring medications for a reconciliation at next visit. Continue medications as prescribed. Counseled on blood pressure goal of less than 130/80, low-sodium, DASH diet, medication compliance, 150 minutes of moderate intensity exercise per week.   Meds ordered this encounter  Medications  . Insulin Glargine (LANTUS SOLOSTAR) 100 UNIT/ML Solostar Pen    Sig: Inject 35 Units into the skin daily.    Dispense:  30 mL    Refill:  6    Dose change  . allopurinol (ZYLOPRIM) 300 MG tablet    Sig: Take 1 tablet (300 mg total) by mouth daily.    Dispense:  90 tablet    Refill:  1  . levothyroxine (SYNTHROID) 175 MCG tablet    Sig: TAKE 1 TABLET BY MOUTH ONCE DAILY BEFORE BREAKFAST    Dispense:  90 tablet    Refill:  0  . liraglutide (VICTOZA) 18 MG/3ML SOPN    Sig: Inject 0.3 mLs (1.8 mg total) into the skin daily with breakfast.    Dispense:  30 mL    Refill:  3  . hydrocortisone 1 % ointment    Sig: Apply  1 application topically 2 (two) times daily.    Dispense:  30 g    Refill:  1    Follow-up: Return in about 3 months (around 02/11/2019) for medical conditions.    Tomasita Morrow, RN

## 2018-12-15 MED FILL — $LANTUS SOLOSTAR 100 UNITS/: 100 | 85 days supply | Qty: 30 | Fill #0

## 2018-12-15 MED FILL — $VICTOZA 2-PAK 18MG/3ML PEN: 18 | 90 days supply | Qty: 27 | Fill #3

## 2019-01-15 ENCOUNTER — Telehealth: Payer: Self-pay | Admitting: Family Medicine

## 2019-02-02 ENCOUNTER — Other Ambulatory Visit: Payer: Self-pay | Admitting: Family Medicine

## 2019-02-02 MED FILL — ?ALLOPURINOL 300 MG TABLET: 300 | 30 days supply | Qty: 30 | Fill #1

## 2019-02-02 MED FILL — ?METFORMIN HCL 500MG TABLET: 500 | 30 days supply | Qty: 120 | Fill #0

## 2019-02-02 MED FILL — LOSARTAN POTASSIUM 100 MG T: 100 | 30 days supply | Qty: 30 | Fill #2

## 2019-02-02 MED FILL — LEVOTHYROXINE 175 MCG TAB: 175 | 30 days supply | Qty: 30 | Fill #1

## 2019-02-04 MED FILL — ?AMLODIPINE BESYLATE 10 MG: 10 | 30 days supply | Qty: 30 | Fill #0

## 2019-02-11 ENCOUNTER — Encounter: Payer: Self-pay | Admitting: Family Medicine

## 2019-02-11 ENCOUNTER — Other Ambulatory Visit: Payer: Self-pay

## 2019-02-11 ENCOUNTER — Ambulatory Visit: Payer: PRIVATE HEALTH INSURANCE | Attending: Family Medicine | Admitting: Family Medicine

## 2019-02-11 DIAGNOSIS — I10 Essential (primary) hypertension: Secondary | ICD-10-CM

## 2019-02-11 DIAGNOSIS — M62838 Other muscle spasm: Secondary | ICD-10-CM

## 2019-02-11 MED ORDER — TIZANIDINE HCL 4 MG PO TABS: 4.0000 mg | ORAL_TABLET | Freq: Three times a day (TID) | ORAL | 1 refills | Status: DC | PRN

## 2019-02-11 MED ORDER — METOPROLOL SUCCINATE ER 25 MG PO TB24: 25.0000 mg | ORAL_TABLET | Freq: Every day | ORAL | 1 refills | Status: DC

## 2019-02-11 MED FILL — tiZANidine HCL 4 MG TABS: 4 | 20 days supply | Qty: 60 | Fill #0

## 2019-02-11 MED FILL — ?METOPROLOL SUCC ER 25MG TA: 25 | 30 days supply | Qty: 30 | Fill #0

## 2019-02-11 NOTE — Progress Notes (Signed)
Virtual Visit via Telephone Note  I connected with Ray Garner, on 02/11/2019 at 9:19 AM by telephone due to the COVID-19 pandemic and verified that I am speaking with the correct person using two identifiers.   Consent: I discussed the limitations, risks, security and privacy concerns of performing an evaluation and management service by telephone and the availability of in person appointments. I also discussed with the patient that there may be a patient responsible charge related to this service. The patient expressed understanding and agreed to proceed.   Location of Patient: Home  Location of Provider: Clinic   Persons participating in Telemedicine visit: Kaleb Boedeker [place]-CMA Dr. Margarita Rana     History of Present Illness: Ray Garner  is a 63 year old male with history of type 2 diabetes mellitus (A1c 10.7), hypertension, hypothyroidism, Gout who presents for an acute visit.   He complains of pain in left elbow and pain in R chestwall which shoots around to his back. Symptoms occur when he sits upright for a long period of time. Symptoms have been present for 3 weeks to a month. The elbow pain has been present for 2-3 weeks and then he adds that pain radiates to his left wrist. Sometimes he has cramps in his abdomen.  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 His blood sugar is 105; the highest has been 250 which he had after eating watermelon. He went for his DOT physical and was found to have elevated blood pressure; a form was sent over to clear him provided blood pressure is less than 140/90. He states his blood pressures at home have been controlled.  Past Medical History:  Diagnosis Date  . Anemia   . Arthritis   . Back pain, lumbosacral 06/30/2013  . Blood transfusion without reported diagnosis   . Cataract    BEGINNING  . DM type 2 (diabetes mellitus, type 2) ([place]) 06/30/2013  . Gout   . HTN (hypertension) 06/30/2013  . Hypertension   .  Hypothyroid 06/30/2013   Allergies  Allergen Reactions  . Vicodin [Hydrocodone-Acetaminophen] Hives and Rash    Current Outpatient Medications on File Prior to Visit  Medication Sig Dispense Refill  . allopurinol (ZYLOPRIM) 300 MG tablet Take 1 tablet (300 mg total) by mouth daily. 90 tablet 1  . amLODipine (NORVASC) 10 MG tablet TAKE 1 TABLET (10 MG TOTAL) BY MOUTH DAILY. 30 tablet 2  . carvedilol (COREG) 12.5 MG tablet Take 1 tablet (12.5 mg total) by mouth 2 (two) times daily with a meal. 180 tablet 1  . glucose blood (BAYER CONTOUR TEST) test strip Use as instructed 100 each 12  . hydrocortisone 1 % ointment Apply 1 application topically 2 (two) times daily. 30 g 1  . Insulin Glargine (LANTUS SOLOSTAR) 100 UNIT/ML Solostar Pen Inject 35 Units into the skin daily. 30 mL 6  . Insulin Pen Needle 31G X 5 MM MISC 1 each by Does not apply route at bedtime. 100 each 5  . levothyroxine (SYNTHROID) 175 MCG tablet TAKE 1 TABLET BY MOUTH ONCE DAILY BEFORE BREAKFAST 90 tablet 0  . liraglutide (VICTOZA) 18 MG/3ML SOPN Inject 0.3 mLs (1.8 mg total) into the skin daily with breakfast. 30 mL 3  . losartan (COZAAR) 100 MG tablet Take 1 tablet (100 mg total) by mouth daily. 90 tablet 1  . metFORMIN (GLUCOPHAGE) 500 MG tablet TAKE 2 TABLETS BY MOUTH TWICE DAILY WITH A MEAL 180 tablet 1  . omeprazole (PRILOSEC) 20 MG capsule Take  1 capsule (20 mg total) by mouth daily. 90 capsule 1  . vitamin B-12 (CYANOCOBALAMIN) 1000 MCG tablet Take 1,000 mcg by mouth daily.    Marland Kitchen albuterol (PROVENTIL HFA;VENTOLIN HFA) 108 (90 Base) MCG/ACT inhaler Inhale 1-2 puffs into the lungs every 6 (six) hours as needed for wheezing or shortness of breath. (Patient not taking: Reported on 10/16/2018) 1 Inhaler 0  . cetirizine (ZYRTEC) 10 MG tablet Take 1 tablet (10 mg total) by mouth daily. (Patient not taking: Reported on 10/16/2018) 30 tablet 1  . Continuous Blood Gluc Receiver (FREESTYLE LIBRE 14 DAY READER) DEVI 1 each by Does not  apply route 3 (three) times daily with meals. (Patient not taking: Reported on 02/11/2019) 1 Device 0  . Continuous Blood Gluc Sensor (FREESTYLE LIBRE 14 DAY SENSOR) MISC 1 each by Does not apply route 3 (three) times daily with meals. (Patient not taking: Reported on 02/11/2019) 1 each 11  . fluticasone (FLONASE) 50 MCG/ACT nasal spray Place 2 sprays into both nostrils daily. (Patient not taking: Reported on 10/16/2018) 16 g 1  . gabapentin (NEURONTIN) 300 MG capsule Take 1 capsule (300 mg total) by mouth at bedtime. (Patient not taking: Reported on 10/16/2018) 30 capsule 3  . meclizine (ANTIVERT) 25 MG tablet Take 1 tablet (25 mg total) by mouth 2 (two) times daily as needed for dizziness. (Patient not taking: Reported on 10/16/2018) 30 tablet 0  . Spacer/Aero-Holding Chambers (AEROCHAMBER PLUS) inhaler Use as instructed (Patient not taking: Reported on 10/16/2018) 1 each 2  . tiZANidine (ZANAFLEX) 4 MG tablet Take 1 tablet (4 mg total) by mouth every 6 (six) hours as needed for muscle spasms. (Patient not taking: Reported on 10/01/2018) 30 tablet 0  . traZODone (DESYREL) 50 MG tablet Take 1 tablet (50 mg total) by mouth at bedtime as needed for sleep. (Patient not taking: Reported on 10/16/2018) 30 tablet 3   No current facility-administered medications on file prior to visit.    Observations/Objective: Awake, alert, oriented x3 Not in acute distress  Lab Results  Component Value Date   HGBA1C 9.6 (A) 11/12/2018    Assessment and Plan: 1. Muscle spasm This could explain his right-sided chest pain, abdominal cramps Advised that tizanidine is sedating and he needs to use it at night - tiZANidine (ZANAFLEX) 4 MG tablet; Take 1 tablet (4 mg total) by mouth every 8 (eight) hours as needed for muscle spasms.  Dispense: 60 tablet; Refill: 1  2. Essential hypertension Uncontrolled Metoprolol added to regimen Continue to keep blood pressure logs at home This will be reviewed at his next visit with  the clinical pharmacist after which I will complete his form so his DOT visit can be scheduled. - metoprolol succinate (TOPROL XL) 25 MG 24 hr tablet; Take 1 tablet (25 mg total) by mouth daily.  Dispense: 90 tablet; Refill: 1   Follow Up Instructions: Return in about 3 months (around 05/12/2019).    I discussed the assessment and treatment plan with the patient. The patient was provided an opportunity to ask questions and all were answered. The patient agreed with the plan and demonstrated an understanding of the instructions.   The patient was advised to call back or seek an in-person evaluation if the symptoms worsen or if the condition fails to improve as anticipated.     I provided 15 minutes total of non-face-to-face time during this encounter including median intraservice time, reviewing previous notes, labs, imaging, medications, management and patient verbalized understanding.  Charlott Rakes, MD, FAAFP. [facility] and [place], [place]   02/11/2019, 9:19 AM

## 2019-02-11 NOTE — Progress Notes (Signed)
Patient has been called and DOB has been verified. Patient has been screened and transferred to PCP to start phone visit.  Patient is having pain in right side of chest.

## 2019-02-16 ENCOUNTER — Ambulatory Visit: Payer: PRIVATE HEALTH INSURANCE | Attending: Family Medicine | Admitting: Pharmacist

## 2019-02-16 VITALS — BP 121/73 | HR 83

## 2019-02-16 DIAGNOSIS — I10 Essential (primary) hypertension: Secondary | ICD-10-CM | POA: Diagnosis not present

## 2019-02-16 NOTE — Patient Instructions (Signed)

## 2019-02-16 NOTE — Progress Notes (Signed)
   S:    PCP: Dr. Margarita Rana  Patient arrives in good spirits. Presents to the clinic for BP check.  Patient was referred and last seen by Primary Care Provider on 02/11/19. Dr. Margarita Rana added metoprolol.   Patient reports adherence with medications.  Current BP Medications include:  Amlodipine 10 mg daily, losartan 100 mg daily, Toprol XL 25 mg daily   Dietary habits include: limits salt; denies drinking caffeine  Exercise habits include: limited  Family / Social history:  - FHx: no pertinent positives - Tobacco: former smoker - Alcohol: admits to occasional use   O:  Vitals:   02/16/19 1058  BP: 121/73  Pulse: 83   Home BP readings: none   Last 3 Office BP readings: BP Readings from Last 3 Encounters:  02/16/19 121/73  11/12/18 (!) 154/99  10/16/18 (!) 143/97   BMET    Component Value Date/Time   NA 142 08/13/2018 1022   K 4.1 08/13/2018 1022   CL 103 08/13/2018 1022   CO2 22 08/13/2018 1022   GLUCOSE 184 (H) 08/13/2018 1022   GLUCOSE 320 (H) 07/08/2017 1541   BUN 8 08/13/2018 1022   CREATININE 0.93 08/13/2018 1022   CREATININE 0.89 04/11/2016 1445   CALCIUM 9.5 08/13/2018 1022   GFRNONAA 88 08/13/2018 1022   GFRNONAA >89 04/11/2016 1445   GFRAA 102 08/13/2018 1022   GFRAA >89 04/11/2016 1445   Renal function: CrCl cannot be calculated (Patient's most recent lab result is older than the maximum 21 days allowed.).  Clinical ASCVD: No  The 10-year ASCVD risk score Mikey Bussing DC Jr., et al., 2013) is: 24%   Values used to calculate the score:     Age: 63 years     Sex: Male     Is Non-Hispanic African American: Yes     Diabetic: Yes     Tobacco smoker: No     Systolic Blood Pressure: 123XX123 mmHg     Is BP treated: Yes     HDL Cholesterol: 30 mg/dL     Total Cholesterol: 130 mg/dL   A/P: Hypertension longstanding currently at goal on current medications. BP Goal = <130/80 mmHg. Patient is adherent with medications. PCP notified; signatures for DOT form provided.     -Continued current regimen.  -Counseled on lifestyle modifications for blood pressure control including reduced dietary sodium, increased exercise, adequate sleep  Results reviewed and written information provided.   Total time in face-to-face counseling 15 minutes.   F/U Clinic Visit w/ PCP.  Benard Halsted, PharmD, [place] [phone]

## 2019-03-06 MED FILL — !LANTUS SOLOSTAR 100UNITS/M: 100 | 25 days supply | Qty: 9 | Fill #1

## 2019-03-06 MED FILL — $VICTOZA 2-PAK 18MG/3ML PEN: 18 | 90 days supply | Qty: 27 | Fill #4

## 2019-03-17 MED FILL — metFORMIN HCL 500 MG TABS: 500 | 60 days supply | Qty: 240 | Fill #1

## 2019-03-17 MED FILL — LOSARTAN POTASSIUM 100 MG T: 100 | 90 days supply | Qty: 90 | Fill #3

## 2019-03-17 MED FILL — METOPROLOL SUCCINATE ER 25: 25 | 90 days supply | Qty: 90 | Fill #1

## 2019-03-17 MED FILL — AMLODIPINE BESYLATE 10 MG T: 10 | 60 days supply | Qty: 60 | Fill #1

## 2019-03-17 MED FILL — tiZANidine HCL 4 MG TABS: 4 | 20 days supply | Qty: 60 | Fill #1

## 2019-03-17 MED FILL — LEVOTHYROXINE 175 MCG TAB: 175 | 30 days supply | Qty: 30 | Fill #2

## 2019-03-17 MED FILL — ALLOPURINOL 300 MG TAB: 300 | 90 days supply | Qty: 90 | Fill #2

## 2019-06-01 ENCOUNTER — Other Ambulatory Visit: Payer: Self-pay | Admitting: Family Medicine

## 2019-06-02 MED FILL — $LANTUS SOLOSTAR 100 UNITS/: 100 | 83 days supply | Qty: 30 | Fill #2

## 2019-06-02 MED FILL — $VICTOZA 2-PAK 18MG/3ML PEN: 18 | 30 days supply | Qty: 9 | Fill #5

## 2019-06-02 MED FILL — AMLODIPINE BESYLATE 10 MG T: 10 | 30 days supply | Qty: 30 | Fill #0

## 2019-06-02 MED FILL — LEVOTHYROXINE SODIUM 175 MC: 175 | 90 days supply | Qty: 90 | Fill #0

## 2019-08-11 MED FILL — METOPROLOL SUCCINATE ER 25: 25 | 60 days supply | Qty: 60 | Fill #2

## 2019-08-11 MED FILL — ALLOPURINOL 300 MG TAB: 300 | 30 days supply | Qty: 30 | Fill #3

## 2019-08-13 NOTE — Telephone Encounter (Signed)
error 

## 2019-08-14 ENCOUNTER — Other Ambulatory Visit: Payer: Self-pay | Admitting: Family Medicine

## 2019-08-14 DIAGNOSIS — E1165 Type 2 diabetes mellitus with hyperglycemia: Secondary | ICD-10-CM

## 2019-08-21 ENCOUNTER — Telehealth: Payer: Self-pay | Admitting: Family Medicine

## 2019-08-21 MED ORDER — AMLODIPINE BESYLATE 10 MG PO TABS: ORAL_TABLET | ORAL | 0 refills | Status: DC

## 2019-08-21 NOTE — Telephone Encounter (Signed)
Rx sent 

## 2019-08-21 NOTE — Telephone Encounter (Signed)
Patient called in and requested for listed medications to be refilled and sent to [facility] amLODipine (NORVASC) 10 MG tablet [[id number]]

## 2019-08-24 MED FILL — ?AMLODIPINE BESYL 10MG TABL: 10 | 30 days supply | Qty: 30 | Fill #0

## 2019-09-01 ENCOUNTER — Other Ambulatory Visit: Payer: Self-pay | Admitting: Family Medicine

## 2019-09-01 ENCOUNTER — Telehealth: Payer: Self-pay | Admitting: Family Medicine

## 2019-09-01 DIAGNOSIS — Z794 Long term (current) use of insulin: Secondary | ICD-10-CM

## 2019-09-01 MED FILL — $VICTOZA 2-PAK 18MG/3ML PEN: 18 | 90 days supply | Qty: 27 | Fill #0

## 2019-09-01 NOTE — Telephone Encounter (Signed)
Requested Prescriptions  Pending Prescriptions Disp Refills   [place] 18 MG/3ML SOPN [Pharmacy Med Name: VICTOZA 2-PAK 18MG /3ML PEN 18 Solution Pen-injector] 9 mL 3    Sig: INJECT 0.3 MLS (1.8 MG TOTAL) INTO THE SKIN DAILY WITH BREAKFAST.     Endocrinology:  Diabetes - GLP-1 Receptor Agonists Failed - 09/01/2019  1:48 PM      Failed - HBA1C is between 0 and 7.9 and within 180 days    HbA1c, POC (controlled diabetic range)  Date Value Ref Range Status  11/12/2018 9.6 (A) 0.0 - 7.0 % Final         Failed - Valid encounter within last 6 months    Recent Outpatient Visits          6 months ago Essential hypertension   [place], Jarome Matin, RPH-CPP   6 months ago Essential hypertension   [place], [place], MD   9 months ago Type 2 diabetes mellitus with hyperglycemia, with long-term current use of insulin [facility])   [place], Enobong, MD   1 year ago [place], Enobong, MD   1 year ago [place], Milford Cage, NP      Future Appointments            In 2 weeks Charlott Rakes, MD [place]

## 2019-09-01 NOTE — Telephone Encounter (Signed)
Medication Refill - Medication: liraglutide (VICTOZA) 18 MG/3ML SOPN Pt states he is coming by office today to get other prescriptions from the pharmacy and has run out of the medication above and wanted to see if possible he picks up today as well/ please advise   Has the patient contacted their pharmacy? No. (Agent: If no, request that the patient contact the pharmacy for the refill.) (Agent: If yes, when and what did the pharmacy advise?)  Preferred Pharmacy (with phone number or street name):  [facility] [address] [place] [address] Phone:  [phone]  Fax:  [phone]       Agent: Please be advised that RX refills may take up to 3 business days. We ask that you follow-up with your pharmacy.

## 2019-09-15 ENCOUNTER — Encounter: Payer: Self-pay | Admitting: Family Medicine

## 2019-09-15 ENCOUNTER — Other Ambulatory Visit: Payer: Self-pay

## 2019-09-15 ENCOUNTER — Other Ambulatory Visit: Payer: Self-pay | Admitting: Family Medicine

## 2019-09-15 ENCOUNTER — Ambulatory Visit: Payer: Self-pay | Attending: Family Medicine | Admitting: Family Medicine

## 2019-09-15 VITALS — BP 152/88 | HR 74 | Ht 72.0 in | Wt 271.0 lb

## 2019-09-15 DIAGNOSIS — I10 Essential (primary) hypertension: Secondary | ICD-10-CM

## 2019-09-15 DIAGNOSIS — E1165 Type 2 diabetes mellitus with hyperglycemia: Secondary | ICD-10-CM

## 2019-09-15 DIAGNOSIS — K219 Gastro-esophageal reflux disease without esophagitis: Secondary | ICD-10-CM

## 2019-09-15 DIAGNOSIS — Z794 Long term (current) use of insulin: Secondary | ICD-10-CM

## 2019-09-15 DIAGNOSIS — M1A9XX0 Chronic gout, unspecified, without tophus (tophi): Secondary | ICD-10-CM

## 2019-09-15 DIAGNOSIS — E039 Hypothyroidism, unspecified: Secondary | ICD-10-CM

## 2019-09-15 DIAGNOSIS — K3184 Gastroparesis: Secondary | ICD-10-CM

## 2019-09-15 LAB — POCT GLYCOSYLATED HEMOGLOBIN (HGB A1C): HbA1c, POC (controlled diabetic range): 10.7 % — AB (ref 0.0–7.0)

## 2019-09-15 LAB — GLUCOSE, POCT (MANUAL RESULT ENTRY): POC Glucose: 256 mg/dl — AB (ref 70–99)

## 2019-09-15 MED ORDER — OMEPRAZOLE 20 MG PO CPDR: 20.0000 mg | DELAYED_RELEASE_CAPSULE | Freq: Every day | ORAL | 1 refills | Status: DC

## 2019-09-15 MED ORDER — LOSARTAN POTASSIUM 100 MG PO TABS: 100.0000 mg | ORAL_TABLET | Freq: Every day | ORAL | 1 refills | Status: DC

## 2019-09-15 MED ORDER — METOCLOPRAMIDE HCL 5 MG PO TABS: 5.0000 mg | ORAL_TABLET | Freq: Three times a day (TID) | ORAL | 1 refills | Status: DC

## 2019-09-15 MED ORDER — ATORVASTATIN CALCIUM 40 MG PO TABS: 40.0000 mg | ORAL_TABLET | Freq: Every day | ORAL | 1 refills | Status: DC

## 2019-09-15 MED ORDER — AMLODIPINE BESYLATE 10 MG PO TABS: ORAL_TABLET | ORAL | 1 refills | Status: DC

## 2019-09-15 MED ORDER — VICTOZA 18 MG/3ML ~~LOC~~ SOPN: 1.8000 mg | PEN_INJECTOR | Freq: Every day | SUBCUTANEOUS | 3 refills | Status: DC

## 2019-09-15 MED ORDER — METFORMIN HCL 500 MG PO TABS: ORAL_TABLET | ORAL | 1 refills | Status: DC

## 2019-09-15 MED ORDER — ALLOPURINOL 300 MG PO TABS: 300.0000 mg | ORAL_TABLET | Freq: Every day | ORAL | 1 refills | Status: DC

## 2019-09-15 MED ORDER — LANTUS SOLOSTAR 100 UNIT/ML ~~LOC~~ SOPN: 42.0000 [IU] | PEN_INJECTOR | Freq: Every day | SUBCUTANEOUS | 6 refills | Status: DC

## 2019-09-15 MED FILL — LOSARTAN POTASSIUM 100 MG T: 100 | 90 days supply | Qty: 90 | Fill #0

## 2019-09-15 MED FILL — METOCLOPRAMIDE 5 MG TABLET: 5 | 60 days supply | Qty: 180 | Fill #0

## 2019-09-15 MED FILL — !LANTUS SOLOSTAR 100UNITS/M: 100 | 21 days supply | Qty: 9 | Fill #0

## 2019-09-15 MED FILL — ALLOPURINOL 300 MG TAB: 300 | 90 days supply | Qty: 90 | Fill #0

## 2019-09-15 MED FILL — AMLODIPINE BESYLATE 10 MG T: 10 | 90 days supply | Qty: 90 | Fill #0

## 2019-09-15 MED FILL — OMEPRAZOLE 20 MG CAP: 20 | 90 days supply | Qty: 90 | Fill #0

## 2019-09-15 MED FILL — METFORMIN HCL 500 MG TABS: 500 | 90 days supply | Qty: 360 | Fill #0

## 2019-09-15 MED FILL — ATORVASTATIN CALCIUM 40 MG: 40 | 90 days supply | Qty: 90 | Fill #0

## 2019-09-15 NOTE — Progress Notes (Signed)
Subjective:  Patient ID: Ray Garner, male    DOB: 1955-03-01  Age: 64 y.o. MRN: [id number]  CC: Diabetes   HPI Ray Garner is a 64 year old male with history of type 2 diabetes mellitus (A1c 10.7), hypertension, hypothyroidism, Gout here for follow-up visit.  Everytime he eats he has a sour taste and is nauseated then feels like he is about to regugitate and sometimes throws up.  Symptoms have been present for the last 3 to 4 weeks.  Denies presence of diarrhea, constipation, blood in stool. He has early satiety and can get full even by eating a few strawberries which is concerning for him.  He has been out of of some of his antihypetensives hence his elevated blood pressure.  Also states he has been intermittently out of his other medications but has been compliant with his diabetic regimen. He has had no gout flares.  Past Medical History:  Diagnosis Date  . Anemia   . Arthritis   . Back pain, lumbosacral 06/30/2013  . Blood transfusion without reported diagnosis   . Cataract    BEGINNING  . DM type 2 (diabetes mellitus, type 2) ([place]) 06/30/2013  . Gout   . HTN (hypertension) 06/30/2013  . Hypertension   . Hypothyroid 06/30/2013    Past Surgical History:  Procedure Laterality Date  . CYST EXCISION    . FRACTURE SURGERY      Family History  Problem Relation Age of Onset  . Breast cancer Niece   . Colon cancer Neg Hx   . Colon polyps Neg Hx   . Esophageal cancer Neg Hx   . Rectal cancer Neg Hx   . Stomach cancer Neg Hx     Allergies  Allergen Reactions  . Vicodin [Hydrocodone-Acetaminophen] Hives and Rash    Outpatient Medications Prior to Visit  Medication Sig Dispense Refill  . Continuous Blood Gluc Receiver (FREESTYLE LIBRE 14 DAY READER) DEVI 1 each by Does not apply route 3 (three) times daily with meals. 1 Device 0  . Continuous Blood Gluc Sensor (FREESTYLE LIBRE 14 DAY SENSOR) MISC 1 each by Does not apply route 3 (three) times daily with meals. 1 each 11  .  gabapentin (NEURONTIN) 300 MG capsule Take 1 capsule (300 mg total) by mouth at bedtime. 30 capsule 3  . Insulin Pen Needle 31G X 5 MM MISC 1 each by Does not apply route at bedtime. 100 each 5  . levothyroxine (SYNTHROID) 175 MCG tablet TAKE 1 TABLET BY MOUTH ONCE DAILY BEFORE BREAKFAST 90 tablet 0  . metoprolol succinate (TOPROL XL) 25 MG 24 hr tablet Take 1 tablet (25 mg total) by mouth daily. 90 tablet 1  . tiZANidine (ZANAFLEX) 4 MG tablet Take 1 tablet (4 mg total) by mouth every 8 (eight) hours as needed for muscle spasms. 60 tablet 1  . vitamin B-12 (CYANOCOBALAMIN) 1000 MCG tablet Take 1,000 mcg by mouth daily.    Marland Kitchen allopurinol (ZYLOPRIM) 300 MG tablet Take 1 tablet (300 mg total) by mouth daily. 90 tablet 1  . amLODipine (NORVASC) 10 MG tablet TAKE 1 TABLET (10 MG TOTAL) BY MOUTH DAILY. 30 tablet 0  . Insulin Glargine (LANTUS SOLOSTAR) 100 UNIT/ML Solostar Pen Inject 35 Units into the skin daily. 30 mL 6  . losartan (COZAAR) 100 MG tablet Take 1 tablet (100 mg total) by mouth daily. 90 tablet 1  . metFORMIN (GLUCOPHAGE) 500 MG tablet TAKE 2 TABLETS BY MOUTH TWICE DAILY WITH A MEAL 180 tablet 1  .  omeprazole (PRILOSEC) 20 MG capsule Take 1 capsule (20 mg total) by mouth daily. 90 capsule 1  . VICTOZA 18 MG/3ML SOPN INJECT 0.3 MLS (1.8 MG TOTAL) INTO THE SKIN DAILY WITH BREAKFAST. 9 mL 3  . albuterol (PROVENTIL HFA;VENTOLIN HFA) 108 (90 Base) MCG/ACT inhaler Inhale 1-2 puffs into the lungs every 6 (six) hours as needed for wheezing or shortness of breath. (Patient not taking: Reported on 10/16/2018) 1 Inhaler 0  . cetirizine (ZYRTEC) 10 MG tablet Take 1 tablet (10 mg total) by mouth daily. (Patient not taking: Reported on 10/16/2018) 30 tablet 1  . fluticasone (FLONASE) 50 MCG/ACT nasal spray Place 2 sprays into both nostrils daily. (Patient not taking: Reported on 10/16/2018) 16 g 1  . glucose blood (BAYER CONTOUR TEST) test strip Use as instructed (Patient not taking: Reported on 09/15/2019)  100 each 12  . hydrocortisone 1 % ointment Apply 1 application topically 2 (two) times daily. (Patient not taking: Reported on 09/15/2019) 30 g 1  . meclizine (ANTIVERT) 25 MG tablet Take 1 tablet (25 mg total) by mouth 2 (two) times daily as needed for dizziness. (Patient not taking: Reported on 10/16/2018) 30 tablet 0  . Spacer/Aero-Holding Chambers (AEROCHAMBER PLUS) inhaler Use as instructed (Patient not taking: Reported on 10/16/2018) 1 each 2  . traZODone (DESYREL) 50 MG tablet Take 1 tablet (50 mg total) by mouth at bedtime as needed for sleep. (Patient not taking: Reported on 10/16/2018) 30 tablet 3   No facility-administered medications prior to visit.     ROS Review of Systems  Constitutional: Negative for activity change and appetite change.  HENT: Negative for sinus pressure and sore throat.   Eyes: Negative for visual disturbance.  Respiratory: Negative for cough, chest tightness and shortness of breath.   Cardiovascular: Negative for chest pain and leg swelling.  Gastrointestinal: Positive for nausea and vomiting. Negative for abdominal distention, abdominal pain, constipation and diarrhea.  Endocrine: Negative.   Genitourinary: Negative for dysuria.  Musculoskeletal: Negative for joint swelling and myalgias.  Skin: Negative for rash.  Allergic/Immunologic: Negative.   Neurological: Negative for weakness, light-headedness and numbness.  Psychiatric/Behavioral: Negative for dysphoric mood and suicidal ideas.    Objective:  BP (!) 152/88   Pulse 74   Ht 6' (1.829 m)   Wt 271 lb (122.9 kg)   SpO2 98%   BMI 36.75 kg/m   BP/Weight 09/15/2019 02/16/2019 7/34/2876  Systolic BP 811 572 620  Diastolic BP 88 73 99  Wt. (Lbs) 271 - 274  BMI 36.75 - 37.16      Physical Exam Constitutional:      Appearance: He is well-developed.  Neck:     Vascular: No JVD.  Cardiovascular:     Rate and Rhythm: Normal rate.     Heart sounds: Normal heart sounds. No murmur heard.    Pulmonary:     Effort: Pulmonary effort is normal.     Breath sounds: Normal breath sounds. No wheezing or rales.  Chest:     Chest wall: No tenderness.  Abdominal:     General: Bowel sounds are normal. There is no distension.     Palpations: Abdomen is soft. There is no mass.     Tenderness: There is no abdominal tenderness.  Musculoskeletal:        General: Normal range of motion.     Right lower leg: No edema.     Left lower leg: No edema.  Neurological:     Mental Status: He is alert  and oriented to person, place, and time.  Psychiatric:        Mood and Affect: Mood normal.     CMP Latest Ref Rng & Units 08/13/2018 01/28/2018 10/16/2017  Glucose 65 - 99 mg/dL 184(H) 131(H) 141(H)  BUN 8 - 27 mg/dL 8 11 11   Creatinine 0.76 - 1.27 mg/dL 0.93 0.96 0.93  Sodium 134 - 144 mmol/L 142 145(H) 139  Potassium 3.5 - 5.2 mmol/L 4.1 4.3 4.4  Chloride 96 - 106 mmol/L 103 107(H) 101  CO2 20 - 29 mmol/L 22 21 23   Calcium 8.6 - 10.2 mg/dL 9.5 9.6 9.3  Total Protein 6.0 - 8.5 g/dL 7.2 - 7.1  Total Bilirubin 0.0 - 1.2 mg/dL 0.3 - 0.4  Alkaline Phos 39 - 117 IU/L 88 - 79  AST 0 - 40 IU/L 16 - 17  ALT 0 - 44 IU/L 16 - 17    Lipid Panel     Component Value Date/Time   CHOL 130 08/13/2018 1022   TRIG 162 (H) 08/13/2018 1022   HDL 30 (L) 08/13/2018 1022   CHOLHDL 4.3 08/13/2018 1022   CHOLHDL 3.6 08/08/2015 0845   VLDL 34 (H) 08/08/2015 0845   LDLCALC 68 08/13/2018 1022    CBC    Component Value Date/Time   WBC 10.4 08/28/2018 1507   WBC 9.1 07/08/2017 1541   RBC 4.67 08/28/2018 1507   RBC 4.95 07/08/2017 1541   HGB 13.2 08/28/2018 1507   HCT 39.0 08/28/2018 1507   PLT 274 08/28/2018 1507   MCV 84 08/28/2018 1507   MCH 28.3 08/28/2018 1507   MCH 28.2 12/19/2016 1430   MCHC 33.8 08/28/2018 1507   MCHC 32.9 07/08/2017 1541   RDW 13.4 08/28/2018 1507   LYMPHSABS 3.6 (H) 08/28/2018 1507   MONOABS 0.5 12/06/2010 1638   EOSABS 0.2 08/28/2018 1507   BASOSABS 0.0 08/28/2018  1507    Lab Results  Component Value Date   HGBA1C 10.7 (A) 09/15/2019    The 10-year ASCVD risk score Mikey Bussing DC Jr., et al., 2013) is: 34.6%   Values used to calculate the score:     Age: 15 years     Sex: Male     Is Non-Hispanic African American: Yes     Diabetic: Yes     Tobacco smoker: No     Systolic Blood Pressure: 067 mmHg     Is BP treated: Yes     HDL Cholesterol: 30 mg/dL     Total Cholesterol: 130 mg/dL  Assessment & Plan:   1. Type 2 diabetes mellitus with hyperglycemia, with long-term current use of insulin (HCC) Uncontrolled with A1c of 10.7; goal is less than 7.0 Increased dose of Lantus Counseled on Diabetic diet, my plate method, 703 minutes of moderate intensity exercise/week Blood sugar logs with fasting goals of 80-120 mg/dl, random of less than 180 and in the event of sugars less than 60 mg/dl or greater than 400 mg/dl encouraged to notify the clinic. Advised on the need for annual eye exams, annual foot exams, Pneumonia vaccine. - POCT glucose (manual entry) - POCT glycosylated hemoglobin (Hb A1C) - CMP14+EGFR - Microalbumin / creatinine urine ratio - atorvastatin (LIPITOR) 40 MG tablet; Take 1 tablet (40 mg total) by mouth daily.  Dispense: 90 tablet; Refill: 1 - metFORMIN (GLUCOPHAGE) 500 MG tablet; TAKE 2 TABLETS BY MOUTH TWICE DAILY WITH A MEAL  Dispense: 360 tablet; Refill: 1 - insulin glargine (LANTUS SOLOSTAR) 100 UNIT/ML Solostar Pen; Inject 42 Units  into the skin daily.  Dispense: 30 mL; Refill: 6 - liraglutide (VICTOZA) 18 MG/3ML SOPN; Inject 0.3 mLs (1.8 mg total) into the skin daily with breakfast.  Dispense: 9 mL; Refill: 3  2. Essential hypertension Uncontrolled We will make no regimen change especially since he has been out of his antihypertensive Medications refilled Counseled on blood pressure goal of less than 130/80, low-sodium, DASH diet, medication compliance, 150 minutes of moderate intensity exercise per week. Discussed medication  compliance, adverse effects. - losartan (COZAAR) 100 MG tablet; Take 1 tablet (100 mg total) by mouth daily.  Dispense: 90 tablet; Refill: 1  3. Gastroesophageal reflux disease without esophagitis We will need to evaluate for presence of H. pylori meanwhile he has been commenced on treatment for reflux - H. pylori breath test - amLODipine (NORVASC) 10 MG tablet; TAKE 1 TABLET (10 MG TOTAL) BY MOUTH DAILY.  Dispense: 90 tablet; Refill: 1 - omeprazole (PRILOSEC) 20 MG capsule; Take 1 capsule (20 mg total) by mouth daily.  Dispense: 90 capsule; Refill: 1  4. Gastroparesis Symptoms sound like gastroparesis with possibility of superimposed reflux Advised to eat small frequent portions Initiate Reglan If symptoms persist we will need to discuss taking him off Victoza - metoCLOPramide (REGLAN) 5 MG tablet; Take 1 tablet (5 mg total) by mouth 3 (three) times daily before meals.  Dispense: 90 tablet; Refill: 1  5. Chronic gout without tophus, unspecified cause, unspecified site Stable - allopurinol (ZYLOPRIM) 300 MG tablet; Take 1 tablet (300 mg total) by mouth daily.  Dispense: 90 tablet; Refill: 1  6. Acquired hypothyroidism We will check thyroid panel and adjust regimen accordingly; will also be in mind the fact that he might have been out of his levothyroxine for some time - T4, free - TSH  Meds ordered this encounter  Medications  . atorvastatin (LIPITOR) 40 MG tablet    Sig: Take 1 tablet (40 mg total) by mouth daily.    Dispense:  90 tablet    Refill:  1  . metFORMIN (GLUCOPHAGE) 500 MG tablet    Sig: TAKE 2 TABLETS BY MOUTH TWICE DAILY WITH A MEAL    Dispense:  360 tablet    Refill:  1  . insulin glargine (LANTUS SOLOSTAR) 100 UNIT/ML Solostar Pen    Sig: Inject 42 Units into the skin daily.    Dispense:  30 mL    Refill:  6    Dose change  . metoCLOPramide (REGLAN) 5 MG tablet    Sig: Take 1 tablet (5 mg total) by mouth 3 (three) times daily before meals.    Dispense:  90  tablet    Refill:  1  . amLODipine (NORVASC) 10 MG tablet    Sig: TAKE 1 TABLET (10 MG TOTAL) BY MOUTH DAILY.    Dispense:  90 tablet    Refill:  1  . allopurinol (ZYLOPRIM) 300 MG tablet    Sig: Take 1 tablet (300 mg total) by mouth daily.    Dispense:  90 tablet    Refill:  1  . losartan (COZAAR) 100 MG tablet    Sig: Take 1 tablet (100 mg total) by mouth daily.    Dispense:  90 tablet    Refill:  1    Discontinue lisinopril  . omeprazole (PRILOSEC) 20 MG capsule    Sig: Take 1 capsule (20 mg total) by mouth daily.    Dispense:  90 capsule    Refill:  1  . liraglutide (VICTOZA) 18 MG/3ML  SOPN    Sig: Inject 0.3 mLs (1.8 mg total) into the skin daily with breakfast.    Dispense:  9 mL    Refill:  3    Follow-up: Return in about 3 months (around 12/16/2019) for Chronic disease management.       Charlott Rakes, MD, FAAFP. [facility] [place], [place]   09/15/2019, 3:52 PM

## 2019-09-15 NOTE — Patient Instructions (Signed)
Gastroparesis  Gastroparesis is a condition in which food takes longer than normal to empty from the stomach. The condition is usually long-lasting (chronic). It may also be called delayed gastric emptying. There is no cure, but there are treatments and things that you can do at home to help relieve symptoms. Treating the underlying condition that causes gastroparesis can also help relieve symptoms. What are the causes? In many cases, the cause of this condition is not known. Possible causes include:  A hormone (endocrine) disorder, such as hypothyroidism or diabetes.  A nervous system disease, such as Parkinson's disease or multiple sclerosis.  Cancer, infection, or surgery that affects the stomach or vagus nerve. The vagus nerve runs from your chest, through your neck, to the lower part of your brain.  A connective tissue disorder, such as scleroderma.  Certain medicines. What increases the risk? You are more likely to develop this condition if you:  Have certain disorders or diseases, including: ? An endocrine disorder. ? An eating disorder. ? Amyloidosis. ? Scleroderma. ? Parkinson's disease. ? Multiple sclerosis. ? Cancer or infection of the stomach or the vagus nerve.  Have had surgery on the stomach or vagus nerve.  Take certain medicines.  Are male. What are the signs or symptoms? Symptoms of this condition include:  Feeling full after eating very little.  Nausea.  Vomiting.  Heartburn.  Abdominal bloating.  Inconsistent blood sugar (glucose) levels on blood tests.  Lack of appetite.  Weight loss.  Acid from the stomach coming up into the esophagus (gastroesophageal reflux).  Sudden tightening (spasm) of the stomach, which can be painful. Symptoms may come and go. Some people may not notice any symptoms. How is this diagnosed? This condition is diagnosed with tests, such as:  Tests that check how long it takes food to move through the stomach and  intestines. These tests include: ? Upper gastrointestinal (GI) series. For this test, you drink a liquid that shows up well on X-rays, and then X-rays will be taken of your intestines. ? Gastric emptying scintigraphy. For this test, you eat food that contains a small amount of radioactive material, and then scans are taken. ? Wireless capsule GI monitoring system. For this test, you swallow a pill (capsule) that records information about how foods and fluid move through your stomach.  Gastric manometry. For this test, a tube is passed down your throat and into your stomach to measure electrical and muscular activity.  Endoscopy. For this test, a long, thin tube is passed down your throat and into your stomach to check for problems in your stomach lining.  Ultrasound. This test uses sound waves to create images of inside the body. This can help rule out gallbladder disease or pancreatitis as a cause of your symptoms. How is this treated? There is no cure for gastroparesis. Treatment may include:  Treating the underlying cause.  Managing your symptoms by making changes to your diet and exercise habits.  Taking medicines to control nausea and vomiting and to stimulate stomach muscles.  Getting food through a feeding tube in the hospital. This may be done in severe cases.  Having surgery to insert a device into your body that helps improve stomach emptying and control nausea and vomiting (gastric neurostimulator). Follow these instructions at home:  Take over-the-counter and prescription medicines only as told by your health care provider.  Follow instructions from your health care provider about eating or drinking restrictions. Your health care provider may recommend that you: ? Eat   smaller meals more often. ? Eat low-fat foods. ? Eat low-fiber forms of high-fiber foods. For example, eat cooked vegetables instead of raw vegetables. ? Have only liquid foods instead of solid foods. Liquid  foods are easier to digest.  Drink enough fluid to keep your urine pale yellow.  Exercise as often as told by your health care provider.  Keep all follow-up visits as told by your health care provider. This is important. Contact a health care provider if you:  Notice that your symptoms do not improve with treatment.  Have new symptoms. Get help right away if you:  Have severe abdominal pain that does not improve with treatment.  Have nausea that is severe or does not go away.  Cannot drink fluids without vomiting. Summary  Gastroparesis is a chronic condition in which food takes longer than normal to empty from the stomach.  Symptoms include nausea, vomiting, heartburn, abdominal bloating, and loss of appetite.  Eating smaller portions, and low-fat, low-fiber foods may help you manage your symptoms.  Get help right away if you have severe abdominal pain. This information is not intended to replace advice given to you by your health care provider. Make sure you discuss any questions you have with your health care provider. Document Revised: 05/13/2017 Document Reviewed: 12/18/2016 Elsevier Patient Education  2020 [employer]  

## 2019-09-15 NOTE — Progress Notes (Signed)
States that he can not keep any food down.  Needs refills on medication.

## 2019-09-16 LAB — CMP14+EGFR
ALT: 11 IU/L (ref 0–44)
AST: 17 IU/L (ref 0–40)
Albumin/Globulin Ratio: 1.4 (ref 1.2–2.2)
Albumin: 4.1 g/dL (ref 3.8–4.8)
Alkaline Phosphatase: 84 IU/L (ref 48–121)
BUN/Creatinine Ratio: 8 — ABNORMAL LOW (ref 10–24)
BUN: 8 mg/dL (ref 8–27)
Bilirubin Total: 0.4 mg/dL (ref 0.0–1.2)
CO2: 25 mmol/L (ref 20–29)
Calcium: 8.8 mg/dL (ref 8.6–10.2)
Chloride: 102 mmol/L (ref 96–106)
Creatinine, Ser: 0.97 mg/dL (ref 0.76–1.27)
GFR calc Af Amer: 96 mL/min/{1.73_m2} (ref >59)
GFR calc non Af Amer: 83 mL/min/{1.73_m2} (ref >59)
Globulin, Total: 3 g/dL (ref 1.5–4.5)
Glucose: 219 mg/dL — ABNORMAL HIGH (ref 65–99)
Potassium: 4.1 mmol/L (ref 3.5–5.2)
Sodium: 141 mmol/L (ref 134–144)
Total Protein: 7.1 g/dL (ref 6.0–8.5)

## 2019-09-16 LAB — MICROALBUMIN / CREATININE URINE RATIO
Creatinine, Urine: 200.4 mg/dL
Microalb/Creat Ratio: 8 mg/g creat (ref 0–29)
Microalbumin, Urine: 15.4 ug/mL

## 2019-09-16 LAB — T4, FREE: Free T4: 0.63 ng/dL — ABNORMAL LOW (ref 0.82–1.77)

## 2019-09-16 LAB — TSH: TSH: 9.21 u[IU]/mL — ABNORMAL HIGH (ref 0.450–4.500)

## 2019-09-17 ENCOUNTER — Other Ambulatory Visit: Payer: Self-pay | Admitting: Family Medicine

## 2019-09-17 DIAGNOSIS — E039 Hypothyroidism, unspecified: Secondary | ICD-10-CM

## 2019-09-17 LAB — H. PYLORI BREATH TEST: H pylori Breath Test: NEGATIVE

## 2019-09-17 MED ORDER — LEVOTHYROXINE SODIUM 200 MCG PO TABS: ORAL_TABLET | ORAL | 1 refills | Status: DC

## 2019-09-18 MED FILL — LEVOTHYROXINE SODIUM 200 MC: 200 | 90 days supply | Qty: 90 | Fill #0

## 2019-09-24 ENCOUNTER — Telehealth: Payer: Self-pay

## 2019-09-24 NOTE — Telephone Encounter (Signed)
-----   Message from Charlott Rakes, MD sent at 09/17/2019  7:51 PM EDT ----- Helicobacter pylori test is negative, thyroid labs are abnormal and I have sent an increased dose of Levothyroxine to his pharmacy. Other labs are normal

## 2019-09-24 NOTE — Telephone Encounter (Signed)
Patient name and DOB has been verified Patient was informed of lab results. Patient had no questions.  

## 2019-09-29 MED FILL — LEVOTHYROXINE SODIUM 200 MC: 200 | 90 days supply | Qty: 90 | Fill #0

## 2019-11-06 ENCOUNTER — Ambulatory Visit (HOSPITAL_COMMUNITY)
Admission: EM | Admit: 2019-11-06 | Discharge: 2019-11-06 | Disposition: A | Discharge status: Home / Self Care | Payer: Self-pay | Attending: Emergency Medicine | Admitting: Emergency Medicine

## 2019-11-06 ENCOUNTER — Encounter (HOSPITAL_COMMUNITY): Payer: Self-pay | Admitting: *Deleted

## 2019-11-06 ENCOUNTER — Other Ambulatory Visit: Payer: Self-pay

## 2019-11-06 DIAGNOSIS — W19XXXA Unspecified fall, initial encounter: Secondary | ICD-10-CM

## 2019-11-06 DIAGNOSIS — S46811A Strain of other muscles, fascia and tendons at shoulder and upper arm level, right arm, initial encounter: Secondary | ICD-10-CM

## 2019-11-06 MED ORDER — TIZANIDINE HCL 2 MG PO TABS: 2.0000 mg | ORAL_TABLET | Freq: Four times a day (QID) | ORAL | 0 refills | Status: DC | PRN

## 2019-11-06 MED ORDER — MELOXICAM 7.5 MG PO TABS
7.5000 mg | ORAL_TABLET | Freq: Every day | ORAL | 0 refills | Status: DC
Start: 2019-11-06 — End: 2019-12-16

## 2019-11-06 MED FILL — MELOXICAM 7.5 MG TABLET: 7.5 | 20 days supply | Qty: 20 | Fill #0

## 2019-11-06 MED FILL — tiZANidine HCL 2 MG TABS: 2 | 2 days supply | Qty: 20 | Fill #0

## 2019-11-06 NOTE — Discharge Instructions (Addendum)
Light and regular activity as tolerated.  Heat, massage for comfort.  Meloxicam daily. Don't take additional ibuprofen for aleve. Take with food.  Tizanidine as needed for pain and spasm. May be helpful before bed. If symptoms worsen or do not improve in the next week to return to be seen or to follow up with you PCP.

## 2019-11-06 NOTE — ED Provider Notes (Signed)
[place]    CSN: [id number] Arrival date & time: 11/06/19  1009      History   Chief Complaint Chief Complaint  Patient presents with   Shoulder Pain   Fall    HPI Ray Garner is a 64 y.o. male.   Ray Garner presents with complaints of right neck/ shoulder pain after a fall on 9/5. Was helping a friend load a boat onto a trailer, his friend fell onto him, causing Ray Garner to fall back against a chain link fence. Didn't fall to the ground. Pain since. He is right handed. Drives truck, using right arm to shift gears. Some tingling sensation down arm into thumb. No weakness. Has taken some ibuprofen which hasn't helped with symptoms. Denies any previous similar. History of htn and dm.     ROS per HPI, negative if not otherwise mentioned.      Past Medical History:  Diagnosis Date   Anemia    Arthritis    Back pain, lumbosacral 06/30/2013   Blood transfusion without reported diagnosis    Cataract    BEGINNING   DM type 2 (diabetes mellitus, type 2) ([place]) 06/30/2013   Gout    HTN (hypertension) 06/30/2013   Hypertension    Hypothyroid 06/30/2013    Patient Active Problem List   Diagnosis Date Noted   Healthcare maintenance 07/08/2017   B12 deficiency 07/08/2017   Carpal tunnel syndrome 08/01/2015   DM type 2 (diabetes mellitus, type 2) ([place]) 06/30/2013   HTN (hypertension) 06/30/2013   Gout 06/30/2013   Back pain, lumbosacral 06/30/2013   Testicular cyst s/p surgery 06/30/2013   Hypothyroidism 06/30/2013    Past Surgical History:  Procedure Laterality Date   CYST EXCISION     FRACTURE SURGERY         Home Medications    Prior to Admission medications   Medication Sig Start Date End Date Taking? Authorizing Provider  albuterol (PROVENTIL HFA;VENTOLIN HFA) 108 (90 Base) MCG/ACT inhaler Inhale 1-2 puffs into the lungs every 6 (six) hours as needed for wheezing or shortness of breath. Patient not taking: Reported on 10/16/2018  04/03/18   Melynda Ripple, MD  allopurinol (ZYLOPRIM) 300 MG tablet Take 1 tablet (300 mg total) by mouth daily. 09/15/19   Charlott Rakes, MD  amLODipine (NORVASC) 10 MG tablet TAKE 1 TABLET (10 MG TOTAL) BY MOUTH DAILY. 09/15/19   Charlott Rakes, MD  atorvastatin (LIPITOR) 40 MG tablet Take 1 tablet (40 mg total) by mouth daily. 09/15/19   Charlott Rakes, MD  cetirizine (ZYRTEC) 10 MG tablet Take 1 tablet (10 mg total) by mouth daily. Patient not taking: Reported on 10/16/2018 08/28/18   Charlott Rakes, MD  Continuous Blood Gluc Receiver (FREESTYLE LIBRE 14 DAY READER) DEVI 1 each by Does not apply route 3 (three) times daily with meals. 07/11/17   Charlott Rakes, MD  Continuous Blood Gluc Sensor (FREESTYLE LIBRE 14 DAY SENSOR) MISC 1 each by Does not apply route 3 (three) times daily with meals. 07/11/17   Charlott Rakes, MD  fluticasone (FLONASE) 50 MCG/ACT nasal spray Place 2 sprays into both nostrils daily. Patient not taking: Reported on 10/16/2018 08/28/18   Charlott Rakes, MD  gabapentin (NEURONTIN) 300 MG capsule Take 1 capsule (300 mg total) by mouth at bedtime. 08/12/18   Charlott Rakes, MD  glucose blood (BAYER CONTOUR TEST) test strip Use as instructed Patient not taking: Reported on 09/15/2019 07/08/17   Libby Maw, MD  hydrocortisone 1 % ointment Apply 1  application topically 2 (two) times daily. Patient not taking: Reported on 09/15/2019 11/12/18   Charlott Rakes, MD  insulin glargine (LANTUS SOLOSTAR) 100 UNIT/ML Solostar Pen Inject 42 Units into the skin daily. 09/15/19   Charlott Rakes, MD  Insulin Pen Needle 31G X 5 MM MISC 1 each by Does not apply route at bedtime. 06/09/18   Charlott Rakes, MD  levothyroxine (SYNTHROID) 200 MCG tablet TAKE 1 TABLET BY MOUTH ONCE DAILY BEFORE BREAKFAST 09/17/19   Charlott Rakes, MD  liraglutide (VICTOZA) 18 MG/3ML SOPN Inject 0.3 mLs (1.8 mg total) into the skin daily with breakfast. 09/15/19   Charlott Rakes, MD  losartan (COZAAR) 100  MG tablet Take 1 tablet (100 mg total) by mouth daily. 09/15/19   Charlott Rakes, MD  meclizine (ANTIVERT) 25 MG tablet Take 1 tablet (25 mg total) by mouth 2 (two) times daily as needed for dizziness. Patient not taking: Reported on 10/16/2018 08/19/18   Kerin Perna, NP  meloxicam (MOBIC) 7.5 MG tablet Take 1 tablet (7.5 mg total) by mouth daily. 11/06/19   Augusto Gamble B, NP  metFORMIN (GLUCOPHAGE) 500 MG tablet TAKE 2 TABLETS BY MOUTH TWICE DAILY WITH A MEAL 09/15/19   Charlott Rakes, MD  metoCLOPramide (REGLAN) 5 MG tablet Take 1 tablet (5 mg total) by mouth 3 (three) times daily before meals. 09/15/19   Charlott Rakes, MD  metoprolol succinate (TOPROL XL) 25 MG 24 hr tablet Take 1 tablet (25 mg total) by mouth daily. 02/11/19   Charlott Rakes, MD  omeprazole (PRILOSEC) 20 MG capsule Take 1 capsule (20 mg total) by mouth daily. 09/15/19   Charlott Rakes, MD  Spacer/Aero-Holding Chambers (AEROCHAMBER PLUS) inhaler Use as instructed Patient not taking: Reported on 10/16/2018 04/03/18   Melynda Ripple, MD  tiZANidine (ZANAFLEX) 2 MG tablet Take 1-2 tablets (2-4 mg total) by mouth every 6 (six) hours as needed for muscle spasms. 11/06/19   Zigmund Gottron, NP  vitamin B-12 (CYANOCOBALAMIN) 1000 MCG tablet Take 1,000 mcg by mouth daily.    [provider]    Family History Family History  Problem Relation Age of Onset   Breast cancer Niece    Colon cancer Neg Hx    Colon polyps Neg Hx    Esophageal cancer Neg Hx    Rectal cancer Neg Hx    Stomach cancer Neg Hx     Social History Social History   Tobacco Use   Smoking status: Former Smoker    Quit date: 05/14/1983    Years since quitting: 36.5   Smokeless tobacco: Former Counsellor Use: Former  Substance Use Topics   Alcohol use: Yes    Comment: OCC.   Drug use: No     Allergies   Vicodin [hydrocodone-acetaminophen]   Review of Systems Review of Systems   Physical Exam Triage  Vital Signs ED Triage Vitals  Enc Vitals Group     BP 11/06/19 1147 (!) 165/97     Pulse Rate 11/06/19 1147 74     Resp --      Temp 11/06/19 1147 98.3 F (36.8 C)     Temp Source 11/06/19 1147 Oral     SpO2 11/06/19 1147 99 %     Weight --      Height --      Head Circumference --      Peak Flow --      Pain Score 11/06/19 1146 8     Pain Loc --  Pain Edu? --      Excl. in [place]? --    No data found.  Updated Vital Signs BP (!) 165/97 (BP Location: Left Arm)    Pulse 74    Temp 98.3 F (36.8 C) (Oral)    SpO2 99%   Visual Acuity Right Eye Distance:   Left Eye Distance:   Bilateral Distance:    Right Eye Near:   Left Eye Near:    Bilateral Near:     Physical Exam Constitutional:      Appearance: He is well-developed.  Neck:      Comments: Right lateral neck and posterior shoulder- trapezius, with tenderness and discomfort with neck rotation; full ROM of right shoulder; gross sensation intact and strength equal bilaterally; no bony tenderness or spinous process tenderness; hand with full ROM and gross sensation intact  Cardiovascular:     Rate and Rhythm: Normal rate.  Pulmonary:     Effort: Pulmonary effort is normal.  Skin:    General: Skin is warm and dry.  Neurological:     Mental Status: He is alert and oriented to person, place, and time.      UC Treatments / Results  Labs (all labs ordered are listed, but only abnormal results are displayed) Labs Reviewed - No data to display  EKG   Radiology No results found.  Procedures Procedures (including critical care time)  Medications Ordered in UC Medications - No data to display  Initial Impression / Assessment and Plan / UC Course  I have reviewed the triage vital signs and the nursing notes.  Pertinent labs & imaging results that were available during my care of the patient were reviewed by me and considered in my medical decision making (see chart for details).     Consistent with muscle  strain. Pain management and expected course of rehab discussed. Patient verbalized understanding and agreeable to plan.   Final Clinical Impressions(s) / UC Diagnoses   Final diagnoses:  Fall, initial encounter  Strain of right trapezius muscle, initial encounter     Discharge Instructions     Light and regular activity as tolerated.  Heat, massage for comfort.  Meloxicam daily. Don't take additional ibuprofen for aleve. Take with food.  Tizanidine as needed for pain and spasm. May be helpful before bed. If symptoms worsen or do not improve in the next week to return to be seen or to follow up with you PCP.     ED Prescriptions    Medication Sig Dispense Auth. Provider   tiZANidine (ZANAFLEX) 2 MG tablet Take 1-2 tablets (2-4 mg total) by mouth every 6 (six) hours as needed for muscle spasms. 20 tablet Augusto Gamble B, NP   meloxicam (MOBIC) 7.5 MG tablet Take 1 tablet (7.5 mg total) by mouth daily. 20 tablet Zigmund Gottron, NP     PDMP not reviewed this encounter.   Zigmund Gottron, NP 11/06/19 1258

## 2019-11-06 NOTE — ED Triage Notes (Signed)
Patient states that he was helping someone out of a truck and slipped, falling on his right side. States that he landed on a chain link fence. States pain to right shoulder radiating down arm and states decreased sensation to right arm.   Patient is moving right arm.

## 2019-12-10 MED FILL — VICTOZA 18 MG/3 ML INJECT P: 18 | 30 days supply | Qty: 9 | Fill #0

## 2019-12-10 MED FILL — $LANTUS SOLOSTAR 100 UNITS/: 100 | 70 days supply | Qty: 30 | Fill #1

## 2019-12-16 ENCOUNTER — Other Ambulatory Visit: Payer: Self-pay

## 2019-12-16 ENCOUNTER — Ambulatory Visit: Payer: 59 | Attending: Family Medicine | Admitting: Family Medicine

## 2019-12-16 ENCOUNTER — Encounter: Payer: Self-pay | Admitting: Family Medicine

## 2019-12-16 ENCOUNTER — Ambulatory Visit (HOSPITAL_BASED_OUTPATIENT_CLINIC_OR_DEPARTMENT_OTHER): Payer: 59 | Admitting: Pharmacist

## 2019-12-16 ENCOUNTER — Other Ambulatory Visit: Payer: Self-pay | Admitting: Family Medicine

## 2019-12-16 VITALS — BP 151/84 | HR 78 | Ht 72.0 in | Wt 268.8 lb

## 2019-12-16 DIAGNOSIS — E1142 Type 2 diabetes mellitus with diabetic polyneuropathy: Secondary | ICD-10-CM

## 2019-12-16 DIAGNOSIS — E039 Hypothyroidism, unspecified: Secondary | ICD-10-CM

## 2019-12-16 DIAGNOSIS — I152 Hypertension secondary to endocrine disorders: Secondary | ICD-10-CM

## 2019-12-16 DIAGNOSIS — Z23 Encounter for immunization: Secondary | ICD-10-CM

## 2019-12-16 DIAGNOSIS — E1159 Type 2 diabetes mellitus with other circulatory complications: Secondary | ICD-10-CM

## 2019-12-16 DIAGNOSIS — Z794 Long term (current) use of insulin: Secondary | ICD-10-CM | POA: Diagnosis not present

## 2019-12-16 DIAGNOSIS — K219 Gastro-esophageal reflux disease without esophagitis: Secondary | ICD-10-CM

## 2019-12-16 DIAGNOSIS — E1165 Type 2 diabetes mellitus with hyperglycemia: Secondary | ICD-10-CM

## 2019-12-16 DIAGNOSIS — M7541 Impingement syndrome of right shoulder: Secondary | ICD-10-CM

## 2019-12-16 DIAGNOSIS — M1A9XX0 Chronic gout, unspecified, without tophus (tophi): Secondary | ICD-10-CM

## 2019-12-16 LAB — POCT GLYCOSYLATED HEMOGLOBIN (HGB A1C): HbA1c, POC (controlled diabetic range): 10.2 % — AB (ref 0.0–7.0)

## 2019-12-16 LAB — GLUCOSE, POCT (MANUAL RESULT ENTRY): POC Glucose: 130 mg/dl — AB (ref 70–99)

## 2019-12-16 MED ORDER — MELOXICAM 7.5 MG PO TABS: 7.5000 mg | ORAL_TABLET | Freq: Every day | ORAL | 1 refills | Status: DC

## 2019-12-16 MED ORDER — CARVEDILOL 6.25 MG PO TABS: 6.2500 mg | ORAL_TABLET | Freq: Two times a day (BID) | ORAL | 6 refills | Status: DC

## 2019-12-16 MED ORDER — ATORVASTATIN CALCIUM 40 MG PO TABS: 40.0000 mg | ORAL_TABLET | Freq: Every day | ORAL | 1 refills | Status: DC

## 2019-12-16 MED ORDER — OMEPRAZOLE 20 MG PO CPDR: 20.0000 mg | DELAYED_RELEASE_CAPSULE | Freq: Every day | ORAL | 1 refills | Status: DC

## 2019-12-16 MED ORDER — LANTUS SOLOSTAR 100 UNIT/ML ~~LOC~~ SOPN: 45.0000 [IU] | PEN_INJECTOR | Freq: Every day | SUBCUTANEOUS | 6 refills | Status: DC

## 2019-12-16 MED ORDER — GABAPENTIN 300 MG PO CAPS: 300.0000 mg | ORAL_CAPSULE | Freq: Every day | ORAL | 3 refills | Status: DC

## 2019-12-16 MED ORDER — VICTOZA 18 MG/3ML ~~LOC~~ SOPN: 1.8000 mg | PEN_INJECTOR | Freq: Every day | SUBCUTANEOUS | 3 refills | Status: DC

## 2019-12-16 MED ORDER — TIZANIDINE HCL 4 MG PO TABS: 4.0000 mg | ORAL_TABLET | Freq: Three times a day (TID) | ORAL | 1 refills | Status: DC | PRN

## 2019-12-16 MED ORDER — METFORMIN HCL 500 MG PO TABS: ORAL_TABLET | ORAL | 1 refills | Status: DC

## 2019-12-16 MED ORDER — ALLOPURINOL 300 MG PO TABS: 300.0000 mg | ORAL_TABLET | Freq: Every day | ORAL | 1 refills | Status: DC

## 2019-12-16 MED ORDER — LOSARTAN POTASSIUM 100 MG PO TABS: 100.0000 mg | ORAL_TABLET | Freq: Every day | ORAL | 1 refills | Status: DC

## 2019-12-16 MED ORDER — AMLODIPINE BESYLATE 10 MG PO TABS: ORAL_TABLET | ORAL | 1 refills | Status: DC

## 2019-12-16 MED FILL — METFORMIN HCL 500 MG TABS: 500 | 90 days supply | Qty: 360 | Fill #0

## 2019-12-16 MED FILL — AMLODIPINE BESYLATE 10 MG T: 10 | 90 days supply | Qty: 90 | Fill #0

## 2019-12-16 MED FILL — tiZANidine HCL 4 MG TABS: 4 | 20 days supply | Qty: 60 | Fill #0

## 2019-12-16 MED FILL — LOSARTAN POTASSIUM 100 MG T: 100 | 90 days supply | Qty: 90 | Fill #0

## 2019-12-16 MED FILL — OMEPRAZOLE 20 MG CAP: 20 | 90 days supply | Qty: 90 | Fill #0

## 2019-12-16 MED FILL — ATORVASTATIN CALCIUM 40 MG: 40 | 90 days supply | Qty: 90 | Fill #0

## 2019-12-16 MED FILL — ALLOPURINOL 300 MG TAB: 300 | 90 days supply | Qty: 90 | Fill #0

## 2019-12-16 MED FILL — CARVEDILOL 6.25 MG TABLET: 6.25 | 90 days supply | Qty: 180 | Fill #0

## 2019-12-16 MED FILL — MELOXICAM 7.5 MG TABLET: 7.5 | 30 days supply | Qty: 30 | Fill #0

## 2019-12-16 MED FILL — GABAPENTIN 300 MG CAPSULE: 300 | 30 days supply | Qty: 30 | Fill #0

## 2019-12-16 NOTE — Progress Notes (Signed)
Subjective:  Patient ID: Ray Garner, male    DOB: 25-Feb-1956  Age: 64 y.o. MRN: [id number]  CC: Diabetes   HPI [name] is a 64 year old male with history of type 2 diabetes mellitus (A1c 10.2), hypertension, hypothyroidism, Gout here for follow-up visit. His R arm hurts after he tripped off a truck and now has numbness in his R arm. It occurred a month ago and he was seen at Urgent care and placed on tizanidine and meloxicam which he has run out of.  Pain radiates to posterior neck and R shoulder. He is R handed, has no problem with his grip but his right thumb stays numb.  He is concerned he had no x-rays done at urgent care.  Fasting sugars are around 130-150 but on further questioning he endorses his sugars go up to 250.  He forgets to take his Lantus sometimes at night and on the average can forget up to 3 times a week.  States he has cut out most sweets and is trying to eat more fruit.  Last eye exam was last year. His blood pressure is elevated and he has been compliant with his antihypertensive. Denies recent gout flares and is doing well on levothyroxine. Past Medical History:  Diagnosis Date  . Anemia   . Arthritis   . Back pain, lumbosacral 06/30/2013  . Blood transfusion without reported diagnosis   . Cataract    BEGINNING  . DM type 2 (diabetes mellitus, type 2) ([place]) 06/30/2013  . Gout   . HTN (hypertension) 06/30/2013  . Hypertension   . Hypothyroid 06/30/2013    Past Surgical History:  Procedure Laterality Date  . CYST EXCISION    . FRACTURE SURGERY      Family History  Problem Relation Age of Onset  . Breast cancer Niece   . Colon cancer Neg Hx   . Colon polyps Neg Hx   . Esophageal cancer Neg Hx   . Rectal cancer Neg Hx   . Stomach cancer Neg Hx     Allergies  Allergen Reactions  . Vicodin [Hydrocodone-Acetaminophen] Hives and Rash    Outpatient Medications Prior to Visit  Medication Sig Dispense Refill  . allopurinol (ZYLOPRIM) 300 MG tablet Take 1  tablet (300 mg total) by mouth daily. 90 tablet 1  . amLODipine (NORVASC) 10 MG tablet TAKE 1 TABLET (10 MG TOTAL) BY MOUTH DAILY. 90 tablet 1  . atorvastatin (LIPITOR) 40 MG tablet Take 1 tablet (40 mg total) by mouth daily. 90 tablet 1  . Continuous Blood Gluc Receiver (FREESTYLE LIBRE 14 DAY READER) DEVI 1 each by Does not apply route 3 (three) times daily with meals. 1 Device 0  . Continuous Blood Gluc Sensor (FREESTYLE LIBRE 14 DAY SENSOR) MISC 1 each by Does not apply route 3 (three) times daily with meals. 1 each 11  . gabapentin (NEURONTIN) 300 MG capsule Take 1 capsule (300 mg total) by mouth at bedtime. 30 capsule 3  . insulin glargine (LANTUS SOLOSTAR) 100 UNIT/ML Solostar Pen Inject 42 Units into the skin daily. 30 mL 6  . Insulin Pen Needle 31G X 5 MM MISC 1 each by Does not apply route at bedtime. 100 each 5  . levothyroxine (SYNTHROID) 200 MCG tablet TAKE 1 TABLET BY MOUTH ONCE DAILY BEFORE BREAKFAST 90 tablet 1  . liraglutide (VICTOZA) 18 MG/3ML SOPN Inject 0.3 mLs (1.8 mg total) into the skin daily with breakfast. 9 mL 3  . losartan (COZAAR) 100 MG tablet Take  1 tablet (100 mg total) by mouth daily. 90 tablet 1  . meloxicam (MOBIC) 7.5 MG tablet Take 1 tablet (7.5 mg total) by mouth daily. 20 tablet 0  . metFORMIN (GLUCOPHAGE) 500 MG tablet TAKE 2 TABLETS BY MOUTH TWICE DAILY WITH A MEAL 360 tablet 1  . metoCLOPramide (REGLAN) 5 MG tablet Take 1 tablet (5 mg total) by mouth 3 (three) times daily before meals. 90 tablet 1  . omeprazole (PRILOSEC) 20 MG capsule Take 1 capsule (20 mg total) by mouth daily. 90 capsule 1  . tiZANidine (ZANAFLEX) 2 MG tablet Take 1-2 tablets (2-4 mg total) by mouth every 6 (six) hours as needed for muscle spasms. 20 tablet 0  . vitamin B-12 (CYANOCOBALAMIN) 1000 MCG tablet Take 1,000 mcg by mouth daily.    . metoprolol succinate (TOPROL XL) 25 MG 24 hr tablet Take 1 tablet (25 mg total) by mouth daily. 90 tablet 1  . albuterol (PROVENTIL HFA;VENTOLIN  HFA) 108 (90 Base) MCG/ACT inhaler Inhale 1-2 puffs into the lungs every 6 (six) hours as needed for wheezing or shortness of breath. (Patient not taking: Reported on 10/16/2018) 1 Inhaler 0  . cetirizine (ZYRTEC) 10 MG tablet Take 1 tablet (10 mg total) by mouth daily. (Patient not taking: Reported on 10/16/2018) 30 tablet 1  . fluticasone (FLONASE) 50 MCG/ACT nasal spray Place 2 sprays into both nostrils daily. (Patient not taking: Reported on 10/16/2018) 16 g 1  . glucose blood (BAYER CONTOUR TEST) test strip Use as instructed (Patient not taking: Reported on 09/15/2019) 100 each 12  . hydrocortisone 1 % ointment Apply 1 application topically 2 (two) times daily. (Patient not taking: Reported on 09/15/2019) 30 g 1  . meclizine (ANTIVERT) 25 MG tablet Take 1 tablet (25 mg total) by mouth 2 (two) times daily as needed for dizziness. (Patient not taking: Reported on 10/16/2018) 30 tablet 0  . Spacer/Aero-Holding Chambers (AEROCHAMBER PLUS) inhaler Use as instructed (Patient not taking: Reported on 10/16/2018) 1 each 2   No facility-administered medications prior to visit.     ROS Review of Systems  Constitutional: Negative for activity change and appetite change.  HENT: Negative for sinus pressure and sore throat.   Eyes: Negative for visual disturbance.  Respiratory: Negative for cough, chest tightness and shortness of breath.   Cardiovascular: Negative for chest pain and leg swelling.  Gastrointestinal: Negative for abdominal distention, abdominal pain, constipation and diarrhea.  Endocrine: Negative.   Genitourinary: Negative for dysuria.  Musculoskeletal:       See HPI  Skin: Negative for rash.  Allergic/Immunologic: Negative.   Neurological: Negative for weakness, light-headedness and numbness.  Psychiatric/Behavioral: Negative for dysphoric mood and suicidal ideas.    Objective:  BP (!) 151/84   Pulse 78   Ht 6' (1.829 m)   Wt 268 lb 12.8 oz (121.9 kg)   SpO2 96%   BMI 36.46 kg/m     BP/Weight 12/16/2019 11/06/2019 6/75/9163  Systolic BP 846 659 935  Diastolic BP 84 97 88  Wt. (Lbs) 268.8 - 271  BMI 36.46 - 36.75      Physical Exam Constitutional:      Appearance: He is well-developed.  Neck:     Vascular: No JVD.  Cardiovascular:     Rate and Rhythm: Normal rate.     Heart sounds: Normal heart sounds. No murmur heard.   Pulmonary:     Effort: Pulmonary effort is normal.     Breath sounds: Normal breath sounds. No wheezing or  rales.  Chest:     Chest wall: No tenderness.  Abdominal:     General: Bowel sounds are normal. There is no distension.     Palpations: Abdomen is soft. There is no mass.     Tenderness: There is no abdominal tenderness.  Musculoskeletal:        General: Normal range of motion.     Right lower leg: No edema.     Left lower leg: No edema.     Comments: Positive Hawkins sign; negative Neer sign Slight tenderness on palpation of posterior neck and right trapezius  Neurological:     Mental Status: He is alert and oriented to person, place, and time.  Psychiatric:        Mood and Affect: Mood normal.     CMP Latest Ref Rng & Units 09/15/2019 08/13/2018 01/28/2018  Glucose 65 - 99 mg/dL 219(H) 184(H) 131(H)  BUN 8 - 27 mg/dL 8 8 11   Creatinine 0.76 - 1.27 mg/dL 0.97 0.93 0.96  Sodium 134 - 144 mmol/L 141 142 145(H)  Potassium 3.5 - 5.2 mmol/L 4.1 4.1 4.3  Chloride 96 - 106 mmol/L 102 103 107(H)  CO2 20 - 29 mmol/L 25 22 21   Calcium 8.6 - 10.2 mg/dL 8.8 9.5 9.6  Total Protein 6.0 - 8.5 g/dL 7.1 7.2 -  Total Bilirubin 0.0 - 1.2 mg/dL 0.4 0.3 -  Alkaline Phos 48 - 121 IU/L 84 88 -  AST 0 - 40 IU/L 17 16 -  ALT 0 - 44 IU/L 11 16 -    Lipid Panel     Component Value Date/Time   CHOL 130 08/13/2018 1022   TRIG 162 (H) 08/13/2018 1022   HDL 30 (L) 08/13/2018 1022   CHOLHDL 4.3 08/13/2018 1022   CHOLHDL 3.6 08/08/2015 0845   VLDL 34 (H) 08/08/2015 0845   LDLCALC 68 08/13/2018 1022    CBC    Component Value Date/Time    WBC 10.4 08/28/2018 1507   WBC 9.1 07/08/2017 1541   RBC 4.67 08/28/2018 1507   RBC 4.95 07/08/2017 1541   HGB 13.2 08/28/2018 1507   HCT 39.0 08/28/2018 1507   PLT 274 08/28/2018 1507   MCV 84 08/28/2018 1507   MCH 28.3 08/28/2018 1507   MCH 28.2 12/19/2016 1430   MCHC 33.8 08/28/2018 1507   MCHC 32.9 07/08/2017 1541   RDW 13.4 08/28/2018 1507   LYMPHSABS 3.6 (H) 08/28/2018 1507   MONOABS 0.5 12/06/2010 1638   EOSABS 0.2 08/28/2018 1507   BASOSABS 0.0 08/28/2018 1507    Lab Results  Component Value Date   HGBA1C 10.2 (A) 12/16/2019    Lab Results  Component Value Date   TSH 9.210 (H) 09/15/2019    Assessment & Plan:  1. 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 Compliance has been a major issue with regards to optimization of his blood glucose Increase Lantus from 42 units to 45 units at bedtime Change administration of Lantus from bedtime to morning time as he forgets when he has to take an evening dose He will see the clinical pharmacist for up titration in his Lantus dose according to his blood sugar logs Anticipated A1c on 03/18/2019 however I will see him prior to then to repeat his A1c given he needs this  for his DOT physical Counseled on Diabetic diet, my plate method, 128 minutes of moderate intensity exercise/week Blood sugar logs with fasting goals of 80-120 mg/dl, random of less than 180 and in the event of sugars less than 60 mg/dl or greater than 400 mg/dl encouraged to notify the clinic. Advised on the need for annual eye exams, annual foot exams, Pneumonia vaccine. - POCT glucose (manual entry) - POCT glycosylated hemoglobin (Hb A1C) - Lipid panel - CMP14+EGFR - metFORMIN (GLUCOPHAGE) 500 MG tablet; TAKE 2 TABLETS BY MOUTH TWICE DAILY WITH A MEAL  Dispense: 360  tablet; Refill: 1 - liraglutide (VICTOZA) 18 MG/3ML SOPN; Inject 1.8 mg into the skin daily with breakfast.  Dispense: 9 mL; Refill: 3 - insulin glargine (LANTUS SOLOSTAR) 100 UNIT/ML Solostar Pen; Inject 45 Units into the skin daily.  Dispense: 30 mL; Refill: 6 - atorvastatin (LIPITOR) 40 MG tablet; Take 1 tablet (40 mg total) by mouth daily.  Dispense: 90 tablet; Refill: 1  2. Impingement syndrome of right shoulder Placed on tizanidine and meloxicam Advised that imaging is not indicated at this time If symptoms persist the next 2 to 3 weeks consider imaging - Ambulatory referral to Physical Therapy  3. Gastroesophageal reflux disease without esophagitis Stable - omeprazole (PRILOSEC) 20 MG capsule; Take 1 capsule (20 mg total) by mouth daily.  Dispense: 90 capsule; Refill: 1  4. Hypertension associated with diabetes ([place]) Uncontrolled Switch from metoprolol to Coreg - losartan (COZAAR) 100 MG tablet; Take 1 tablet (100 mg total) by mouth daily.  Dispense: 90 tablet; Refill: 1 - amLODipine (NORVASC) 10 MG tablet; TAKE 1 TABLET (10 MG TOTAL) BY MOUTH DAILY.  Dispense: 90 tablet; Refill: 1  5. Diabetic polyneuropathy associated with type 2 diabetes mellitus ([facility]) Stable - gabapentin (NEURONTIN) 300 MG capsule; Take 1 capsule (300 mg total) by mouth at bedtime.  Dispense: 30 capsule; Refill: 3  6. Chronic gout without tophus, unspecified cause, unspecified site Stable - allopurinol (ZYLOPRIM) 300 MG tablet; Take 1 tablet (300 mg total) by mouth daily.  Dispense: 90 tablet; Refill: 1  7. Hypothyroidism, unspecified type Uncontrolled We will send of thyroid panel and adjust regimen accordingly - T4, free - TSH   Meds ordered this encounter  Medications  . carvedilol (COREG) 6.25 MG tablet    Sig: Take 1 tablet (6.25 mg total) by mouth 2 (two) times daily with a meal.    Dispense:  60 tablet    Refill:  6    Discontinue Metoprolol    Follow-up: Return in about 2 months (around  02/29/2020).       Charlott Rakes, MD, FAAFP. [facility] [place], [place]   12/16/2019, 11:24 AM

## 2019-12-16 NOTE — Patient Instructions (Signed)

## 2019-12-16 NOTE — Progress Notes (Signed)
Had a fall 3 weeks ago and fell on right arm and now he is having pain.

## 2019-12-16 NOTE — Progress Notes (Addendum)
Diabetes Management: Patient was educated on the importance of medication adherence with Lantus and Victoza. 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 Counseled patient to start taking Lantus in the mornings with his Victoza and to make sure to rotate injection sites. Patient verbalized understanding. Also reviewed goal blood glucose levels and provided diet education and MyPlate handout. All questions and concerns were addressed.  Health Maintenance: Patient presents for vaccination against Influenza per orders of Dr. Margarita Rana. Consent given. Counseling provided. No contraindications exists. Vaccine administered without incident.   Plan: 1. Transition to taking Lantus 45 units in the mornings  2. Administered Flu vaccination 3. Follow-up visit with Lurena Joiner in 2 weeks.  Lorel [place], PharmD PGY2 Ambulatory Care Resident [place]

## 2019-12-17 ENCOUNTER — Other Ambulatory Visit: Payer: Self-pay | Admitting: Family Medicine

## 2019-12-17 DIAGNOSIS — E039 Hypothyroidism, unspecified: Secondary | ICD-10-CM

## 2019-12-17 LAB — CMP14+EGFR
ALT: 16 IU/L (ref 0–44)
AST: 19 IU/L (ref 0–40)
Albumin/Globulin Ratio: 1.6 (ref 1.2–2.2)
Albumin: 4.5 g/dL (ref 3.8–4.8)
Alkaline Phosphatase: 102 IU/L (ref 44–121)
BUN/Creatinine Ratio: 9 — ABNORMAL LOW (ref 10–24)
BUN: 7 mg/dL — ABNORMAL LOW (ref 8–27)
Bilirubin Total: 0.5 mg/dL (ref 0.0–1.2)
CO2: 23 mmol/L (ref 20–29)
Calcium: 9.3 mg/dL (ref 8.6–10.2)
Chloride: 104 mmol/L (ref 96–106)
Creatinine, Ser: 0.79 mg/dL (ref 0.76–1.27)
GFR calc Af Amer: 110 mL/min/{1.73_m2} (ref >59)
GFR calc non Af Amer: 96 mL/min/{1.73_m2} (ref >59)
Globulin, Total: 2.9 g/dL (ref 1.5–4.5)
Glucose: 111 mg/dL — ABNORMAL HIGH (ref 65–99)
Potassium: 3.9 mmol/L (ref 3.5–5.2)
Sodium: 142 mmol/L (ref 134–144)
Total Protein: 7.4 g/dL (ref 6.0–8.5)

## 2019-12-17 LAB — TSH: TSH: 2.85 u[IU]/mL (ref 0.450–4.500)

## 2019-12-17 LAB — LIPID PANEL
Chol/HDL Ratio: 3.4 ratio (ref 0.0–5.0)
Cholesterol, Total: 110 mg/dL (ref 100–199)
HDL: 32 mg/dL — ABNORMAL LOW (ref >39)
LDL Chol Calc (NIH): 60 mg/dL (ref 0–99)
Triglycerides: 92 mg/dL (ref 0–149)
VLDL Cholesterol Cal: 18 mg/dL (ref 5–40)

## 2019-12-17 LAB — T4, FREE: Free T4: 1.41 ng/dL (ref 0.82–1.77)

## 2019-12-17 MED ORDER — LEVOTHYROXINE SODIUM 200 MCG PO TABS: ORAL_TABLET | ORAL | 1 refills | Status: DC

## 2019-12-17 MED FILL — LEVOTHYROXINE SODIUM 200 MC: 200 | 90 days supply | Qty: 90 | Fill #0

## 2019-12-18 ENCOUNTER — Telehealth: Payer: Self-pay

## 2019-12-18 NOTE — Telephone Encounter (Signed)
Patient name and DOB has been verified Patient was informed of lab results. Patient had no questions.  

## 2019-12-18 NOTE — Telephone Encounter (Signed)
-----   Message from Charlott Rakes, MD sent at 12/17/2019  2:28 PM EDT ----- Cholesterol is normal, thyroid labs are normal, other labs are stable.

## 2019-12-28 NOTE — Progress Notes (Signed)
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 Presents for diabetes evaluation, education, and management  Patient was referred and last seen by Primary Care Provider on 12/16/19 at which Lantus was increased from 42 to 45 units daily. Of note, patient's A1C needs to be near goal for DOT physical in January 2022.  At that PCP visit, pharmacy also provided medication counseling regarding the importance of adherence with Lantus and Victoza. 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 Instructed patient to start taking Lantus in the mornings with Victoza.   Today, patient reports medication adherence with Lantus and Victoza in the mornings, and metformin twice daily. Reports not missing any doses of Lantus since switching to mornings. Freestyle libre CGM readings range from 95-140 fasting and <180 after meals. His CGM reported one "Low" that occurred this morning around 9AM - treated with peanut butter crackers and a banana. Patient was asymptomatic and did not take Lantus this morning due to this "low" reading, however, he has administered Victoza today.  Family/Social History:  -Fhx: no pertinent positives -Tobacco use: Former smoker (quit 1985)  Insurance coverage/medication affordability: [employer]  Medication adherence reported.   Current diabetes medications include: Lantus 45 units daily, Victoza 1.8 mg daily, metformin 1000 mg BID  Current hypertension medications include: Losartan 100 mg daily, amlodipine 10 mg daily, carvedilol 6.25 mg BID Current hyperlipidemia medications include: atorvastatin 40 mg daily  Patient reports hypoglycemic events - this morning.  Patient reported dietary habits:  Breakfast: green beans, ham biscuit  Lunch: green beans, roasted chicken, limits fast food, steak every now and then, fish, [place] Dinner: limits starches Snacks:  ritz crackers with cheese Drinks: water, cranberry juice, limits sodas, Gatorade every now and then  Patient-reported exercise habits: 20-30 minutes every other day   Patient reports nocturia (nighttime urination).  Patient denies neuropathy (nerve pain). Patient denies visual changes. Patient reports self foot exams.     O:  POCT: 262 (post prandial)  Freestyle Libre CGM readings from 10/20 to present   Home fasting blood sugars: "Low", 140, 116, 144, 114, 92, 122, 111, 96, 95, 135  2 hour post-meal/random blood sugars: 173, 150, 143  Lab Results  Component Value Date   HGBA1C 10.2 (A) 12/16/2019   There were no vitals filed for this visit.  Lipid Panel     Component Value Date/Time   CHOL 110 12/16/2019 1140   TRIG 92 12/16/2019 1140   HDL 32 (L) 12/16/2019 1140   CHOLHDL 3.4 12/16/2019 1140   CHOLHDL 3.6 08/08/2015 0845   VLDL 34 (H) 08/08/2015 0845   LDLCALC 60 12/16/2019 1140    Clinical Atherosclerotic Cardiovascular Disease (ASCVD): No  The ASCVD Risk score Mikey Bussing DC Jr., et al., 2013) failed to calculate for the following reasons:   The valid total cholesterol range is 130 to 320 mg/dL    A/P: Diabetes longstanding currently uncontrolled with A1C 10.2%, however sugars have significantly improved and are within target goal range. Clinic BG elevated today secondary to sugary snacks and skipping morning Lantus for management of hypoglycemia that occurred this morning. Medication adherence appears optimal as patient has not missed any doses of Lantus and Victoza. Will continue current medications. Will consider reducing Lantus if continued hypoglycemia. Recommend checking A1C at next visit to ensure patient is on track for DOT physical scheduled for January 2022. -Continued  Lantus 45 units daily -Continued Victoza 1.8 mg daily -Continued Metformin 1000 mg BID -Check A1C at next visit -Extensively discussed pathophysiology of diabetes, recommended lifestyle  interventions, dietary effects on blood sugar control -Counseled on s/sx of and management of hypoglycemia and patient verbalized understanding -Next A1C anticipated before January 2022 per Dr. Margarita Rana.   ASCVD risk - primary secondary prevention in patient with diabetes. Last LDL is controlled.  -Continued on Atorvastatin 40 mg daily -Recommend checking lipid panel - last one June 2020   Written patient instructions provided.  Total time in face to face counseling 25 minutes.   Follow up Pharmacist Clinic Visit in 1 month.    Lorel Monaco, PharmD PGY2 Ambulatory Care Resident [place]

## 2019-12-30 ENCOUNTER — Other Ambulatory Visit: Payer: Self-pay

## 2019-12-30 ENCOUNTER — Encounter: Payer: Self-pay | Admitting: Pharmacist

## 2019-12-30 ENCOUNTER — Ambulatory Visit: Payer: 59 | Attending: Family Medicine | Admitting: Pharmacist

## 2019-12-30 DIAGNOSIS — Z794 Long term (current) use of insulin: Secondary | ICD-10-CM

## 2019-12-30 DIAGNOSIS — E1165 Type 2 diabetes mellitus with hyperglycemia: Secondary | ICD-10-CM

## 2019-12-30 LAB — GLUCOSE, POCT (MANUAL RESULT ENTRY): POC Glucose: 262 mg/dl — AB (ref 70–99)

## 2020-01-11 IMAGING — DX DG CHEST 2V
2 series · 2 of 2 positions shown · non-contrast
Comparison: No recent prior.

CLINICAL DATA: Cough and congestion.

EXAM:
CHEST - 2 VIEW

[chest pa]
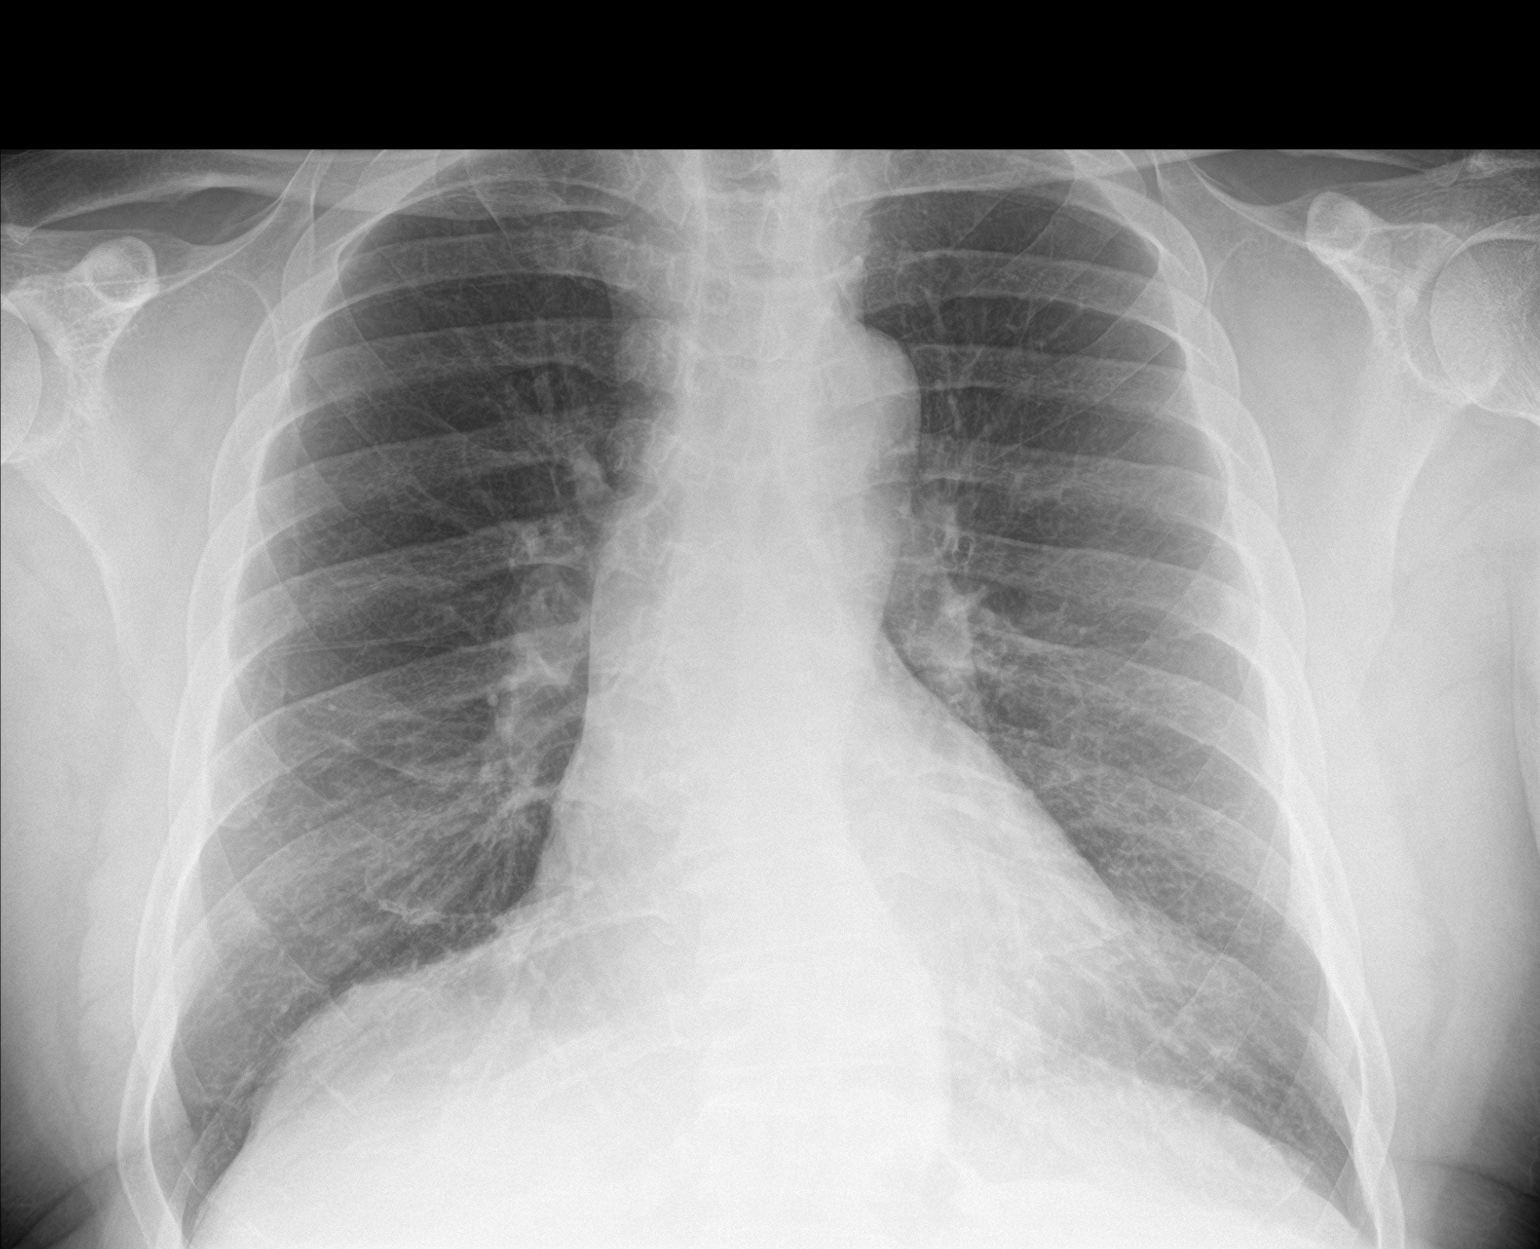

[chest lat]
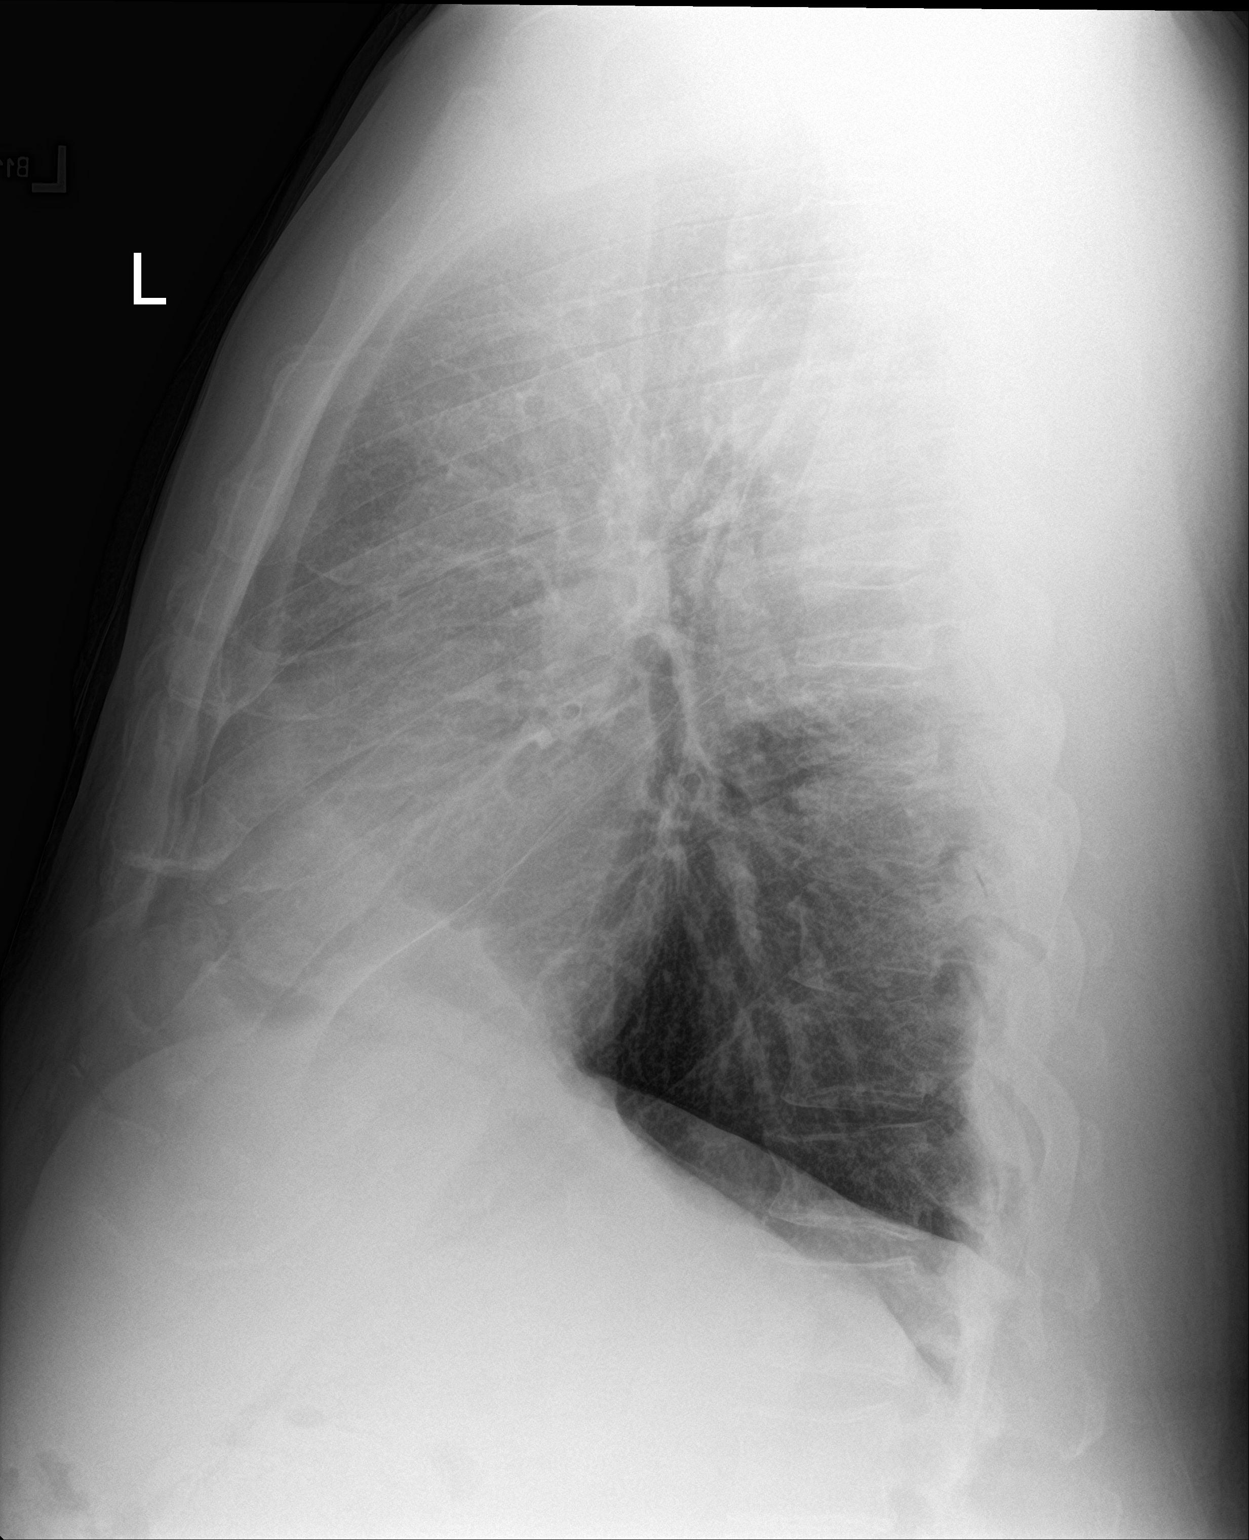

[2 of 2 positions shown; findings below may reference images not displayed]

FINDINGS: Minimal fullness noted in the anterior mediastinum diffusely. This
most likely related to patient rotation and mediastinal fat. No
focal mediastinal mass lesion noted. No hilar adenopathy. Borderline
cardiomegaly with normal pulmonary vascularity. No focal infiltrate.
No pleural effusion or pneumothorax. Mild thoracic spine compression
fractures, age undetermined. Diffuse osteopenia and degenerative
change low lung volumes with mild basilar atelectasis. Cardiomegaly
with normal pulmonary vascularity.
IMPRESSION: Mild cardiomegaly. No pulmonary venous congestion no acute
cardiopulmonary disease.

2. Mild thoracic spine compression fractures, age undetermined.
Diffuse osteopenia degenerative change.

## 2020-01-16 ENCOUNTER — Ambulatory Visit: Payer: 59 | Attending: Family Medicine

## 2020-01-16 ENCOUNTER — Ambulatory Visit: Payer: 59

## 2020-01-16 ENCOUNTER — Other Ambulatory Visit: Payer: Self-pay

## 2020-01-16 DIAGNOSIS — M6281 Muscle weakness (generalized): Secondary | ICD-10-CM | POA: Diagnosis present

## 2020-01-16 DIAGNOSIS — M7541 Impingement syndrome of right shoulder: Secondary | ICD-10-CM | POA: Diagnosis present

## 2020-01-16 DIAGNOSIS — G8929 Other chronic pain: Secondary | ICD-10-CM | POA: Insufficient documentation

## 2020-01-16 DIAGNOSIS — M25511 Pain in right shoulder: Secondary | ICD-10-CM | POA: Insufficient documentation

## 2020-01-16 DIAGNOSIS — M67911 Unspecified disorder of synovium and tendon, right shoulder: Secondary | ICD-10-CM

## 2020-01-16 NOTE — Therapy (Signed)
[place], [place], [address] Phone: [phone]   Fax:  [phone]  Physical Therapy Evaluation  Patient Details  Name: Ray Garner MRN: [id number] Date of Birth: 26-Dec-1955 Referring Provider (PT): Charlott Rakes, MD   Encounter Date: 01/16/2020   PT End of Session - 01/16/20 2156    Visit Number 1    Number of Visits 7    Date for PT Re-Evaluation 03/12/20    Authorization Type [employer]    PT Start Time 1051    PT Stop Time 1148    PT Time Calculation (min) 57 min    Activity Tolerance Patient tolerated treatment well    Behavior During Therapy [facility] for tasks assessed/performed           Past Medical History:  Diagnosis Date  . Anemia   . Arthritis   . Back pain, lumbosacral 06/30/2013  . Blood transfusion without reported diagnosis   . Cataract    BEGINNING  . DM type 2 (diabetes mellitus, type 2) ([place]) 06/30/2013  . Gout   . HTN (hypertension) 06/30/2013  . Hypertension   . Hypothyroid 06/30/2013    Past Surgical History:  Procedure Laterality Date  . CYST EXCISION    . FRACTURE SURGERY      There were no vitals filed for this visit.    Subjective Assessment - 01/16/20 1103    Subjective MOI: Feel backward into a fence helping someone off a tailgate who fell into him. Pt hit on the back of his shoulder and immediantly experienced pain of his neck and shoulder and numbnees of his R shoulder and arm. Currently the numbness is limited to the distal R thumb.    Pertinent History DM2, gout    Limitations Other (comment)    Diagnostic tests none    Patient Stated Goals For the R shoulder to get better and for the pain of my shoulder to stop    Currently in Pain? 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   Pain Location Shoulder    Pain Orientation Right;Upper    Pain Descriptors / Indicators Tightness;Aching    Pain Type Chronic pain    Pain Radiating  Towards Lateral R arm not past the wrist    Pain Onset More than a month ago    Pain Frequency Intermittent    Aggravating Factors  Driving a truck and shifting the gear stick    Pain Relieving Factors heat, massage, rest, medication, hot tub    Effect of Pain on Daily Activities minimal impact              [facility] PT Assessment - 01/16/20 0001      Assessment   Medical Diagnosis Impingement syndrome of right shoulder; R shoulder pain    Referring Provider (PT) Charlott Rakes, MD    Onset Date/Surgical Date --   2-3 months ago   Hand Dominance Right    Next MD Visit 02/29/20      Precautions   Precautions None      Restrictions   Weight Bearing Restrictions No      Balance Screen   Has the patient fallen in the past 6 months Yes    How many times? 1    Has the patient had a decrease in activity level because of a fear of falling?  No    Is the patient reluctant to leave  their home because of a fear of falling?  No      Home Environment   Living Environment Private residence    Living Arrangements Alone    Type of [place] Access Stairs to enter    Entrance Stairs-Number of Steps 2    Entrance Stairs-Rails Right;Left;Can reach both    Home Layout Two level    Alternate Level Stairs-Number of Steps 10    Alternate Level Stairs-Rails Right;Left;Can reach both      Prior Function   Level of Independence Independent    Vocation Part time employment;Retired    Programme researcher, broadcasting/film/video   Overall Cognitive Status Within Functional Limits for tasks assessed      Praxair --   r distal thumb numbnes     Posture/Postural Control   Posture/Postural Control Postural limitations    Postural Limitations Rounded Shoulders;Forward head   R shoulder is lower than L     ROM / Strength   AROM / PROM / Strength AROM;Strength      AROM   Overall AROM Comments R shoulder movements are WNLS and equal to the L. Painful arc c  abduction in the superior [place] area. Cervical ROMs are WNLs c ext, RR, and L SB creating a pulling discomfort of the R upper trap.      Strength   Overall Strength Comments Strengh WNLs c ER and abd provoking pain in the superior GH area      Palpation   Palpation comment TTP to the R upper traps      Special Tests    Special Tests Rotator Cuff Impingement    Rotator Cuff Impingment tests Michel Bickers test;Empty Can test;Full Can test      Hawkins-Kennedy test   Findings Negative    Side Right      Empty Can test   Findings Positive    Side Right    Comment Pain and weakness      Full Can test   Findings Negative    Side Right      Transfers   Transfers Sit to Stand;Stand to Sit    Sit to Stand 7: Independent      Ambulation/Gait   Gait Pattern Within Functional Limits;Step-through pattern                      Objective measurements completed on examination: See above findings.               PT Education - 01/16/20 2155    Education Details Eval findings, POC, HEP, use of heat and massage    Person(s) Educated Patient    Methods Explanation;Demonstration;Tactile cues;Verbal cues;Handout    Comprehension Verbalized understanding;Returned demonstration;Verbal cues required;Tactile cues required;Need further instruction            PT Short Term Goals - 01/16/20 2226      PT SHORT TERM GOAL #1   Title Pt will be Ind in an initail HEP    Status New    Target Date 02/06/20      PT SHORT TERM GOAL #2   Title Pt will voice understanding of measures to reduce and manage R shoulder pain    Target Date 02/06/20             PT Long Term Goals - 01/16/20 2228      PT LONG TERM GOAL #1  Title Pt will be Ind in a final HEP to maintain or progress achieved LOF    Status New    Target Date 03/12/20      PT LONG TERM GOAL #2   Title Pt will report improved R shoulder pain when driving a truck and shifting gears to 3/10 or less     Baseline 7/10    Status New    Target Date 03/12/20      PT LONG TERM GOAL #3   Title With empty can testing of the R shoulder, the result will be negative- painfree and strong    Status New    Target Date 03/12/20                  Plan - 01/16/20 2201    Clinical Impression Statement Pt presents with signs and symptoms consistent with R shoulder RC tendinopathy and associated upper trap tightness. With numbness of the distal R thumb, nerve involvement also appears to be a result of the initial injury. However, this issue has improved with pt reporting numbness of the R shoulder and arm when the accident first occured.    Personal Factors and Comorbidities Comorbidity 2    Comorbidities DM2, gout    Examination-Activity Limitations Lift;Other   shifting gears   Examination-Participation Restrictions Occupation    Stability/Clinical Decision Making Stable/Uncomplicated    Clinical Decision Making Low    Rehab Potential Excellent    PT Frequency 1x / week    PT Duration 6 weeks    PT Treatment/Interventions ADLs/Self Care Home Management;Cryotherapy;Electrical Stimulation;Iontophoresis 4mg /ml Dexamethasone;Moist Heat;Ultrasound;Therapeutic exercise;Therapeutic activities;Patient/family education;Manual techniques;Dry needling;Taping;Vasopneumatic Device    PT Next Visit Plan Assess response to HEP. progress ther ex/HEP, and use manual techniques and modalities as indicated    PT Home Exercise Plan [place] and Agree with Plan of Care Patient           Patient will benefit from skilled therapeutic intervention in order to improve the following deficits and impairments:  Decreased activity tolerance, Decreased strength, Impaired sensation, Pain, Obesity, Impaired UE functional use, Increased muscle spasms  Visit Diagnosis: Chronic right shoulder pain  Impingement syndrome of right shoulder  Muscle weakness (generalized)  Tendinopathy of rotator cuff,  right     Problem List Patient Active Problem List   Diagnosis Date Noted  . Healthcare maintenance 07/08/2017  . B12 deficiency 07/08/2017  . Carpal tunnel syndrome 08/01/2015  . DM type 2 (diabetes mellitus, type 2) ([place]) 06/30/2013  . HTN (hypertension) 06/30/2013  . Gout 06/30/2013  . Back pain, lumbosacral 06/30/2013  . Testicular cyst s/p surgery 06/30/2013  . Hypothyroidism 06/30/2013    Gar Ponto MS, PT 01/16/20 10:37 PM  [facility] [address] [place], [place], [address] Phone: [phone]   Fax:  [phone]  Name: Ray Garner MRN: [id number] Date of Birth: [birth year]

## 2020-01-26 ENCOUNTER — Other Ambulatory Visit: Payer: Self-pay | Admitting: Family Medicine

## 2020-01-26 DIAGNOSIS — E1165 Type 2 diabetes mellitus with hyperglycemia: Secondary | ICD-10-CM

## 2020-01-26 MED FILL — VICTOZA 18 MG/3 ML INJECT P: 18 | 90 days supply | Qty: 27 | Fill #0

## 2020-01-26 NOTE — Telephone Encounter (Signed)
Requested medication (s) are due for refill today:yes  Requested medication (s) are on the active medication list: yes?  Last refill:  06/09/19  Future visit scheduled: yes  Notes to clinic: this order is for 31G x 6 MM. MAR shows 31G x 5 MM      Requested Prescriptions  Pending Prescriptions Disp Refills   TRUEPLUS PEN NEEDLES 31G X 6 MM MISC [Pharmacy Med Name: TRUEPLUS PEN NDL 31G X 1/4" 31G X 6 MM Miscellaneous] 100 each 5    Sig: USE AS DIRECTED AT BEDTIME      Endocrinology: Diabetes - Testing Supplies Passed - 01/26/2020  2:37 PM      Passed - Valid encounter within last 12 months    Recent Outpatient Visits           3 weeks ago Type 2 diabetes mellitus with hyperglycemia, with long-term current use of insulin [facility])   Nenzel, Annie Main L, RPH-CPP   1 month ago Type 2 diabetes mellitus with hyperglycemia, with long-term current use of insulin [facility])   [place], Annie Main L, RPH-CPP   1 month ago Type 2 diabetes mellitus with hyperglycemia, with long-term current use of insulin ([place])   [place], [place], MD   4 months ago Type 2 diabetes mellitus with hyperglycemia, with long-term current use of insulin ([place])   [place], [place], MD   11 months ago Essential hypertension   [place], RPH-CPP       Future Appointments             In 3 days Daisy Blossom, Jarome Matin, [place]

## 2020-01-28 MED FILL — BD PEN NDL MINI 31GX5MM: 31G X 5 MM | 30 days supply | Qty: 100 | Fill #0

## 2020-01-29 ENCOUNTER — Encounter: Payer: Self-pay | Admitting: Pharmacist

## 2020-01-29 ENCOUNTER — Ambulatory Visit: Payer: 59 | Attending: Family Medicine | Admitting: Pharmacist

## 2020-01-29 ENCOUNTER — Other Ambulatory Visit: Payer: Self-pay

## 2020-01-29 DIAGNOSIS — Z794 Long term (current) use of insulin: Secondary | ICD-10-CM

## 2020-01-29 DIAGNOSIS — E1165 Type 2 diabetes mellitus with hyperglycemia: Secondary | ICD-10-CM

## 2020-01-29 LAB — GLUCOSE, POCT (MANUAL RESULT ENTRY): POC Glucose: 90 mg/dl (ref 70–99)

## 2020-01-29 MED ORDER — FREESTYLE LIBRE 14 DAY SENSOR MISC: 1.0000 | Freq: Three times a day (TID) | 11 refills | Status: AC

## 2020-01-29 NOTE — Progress Notes (Signed)
    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 Presents for diabetes evaluation, education, and management  Patient was referred and last seen by Primary Care Provider on 12/16/19. Pharmacy last saw him on 12/30/19 and provided counseling/monitoring. No medication changes were made at that visit.   Family/Social History:  -Fhx: no pertinent positives -Tobacco use: Former smoker (quit 1985)  Insurance coverage/medication affordability: [employer]  Medication adherence reported.   Current diabetes medications include: Lantus 45 units daily, Victoza 1.8 mg daily, metformin 1000 mg BID  Current hypertension medications include: Losartan 100 mg daily, amlodipine 10 mg daily, carvedilol 6.25 mg BID Current hyperlipidemia medications include: atorvastatin 40 mg daily  Patient denies hypoglycemic events.   Patient reported dietary habits:  Breakfast: green beans, ham biscuit  Lunch: green beans, roasted chicken, limits fast food, steak every now and then, fish, [place] Dinner: limits starches Snacks: ritz crackers with cheese Drinks: water, cranberry juice, limits sodas, Gatorade every now and then  Patient-reported exercise habits: 20-30 minutes every other day   Patient reports nocturia (nighttime urination).  Patient denies neuropathy (nerve pain). Patient denies visual changes. Patient reports self foot exams.    O:  POCT: 90   CGM data from Freestyle Libre:  Home fasting blood sugars: 107 - 153  2 hour post-meal/random blood sugars: 163 - 171; 1 reading of 258  Lab Results  Component Value Date   HGBA1C 10.2 (A) 12/16/2019   There were no vitals filed for this visit.  Lipid Panel     Component Value Date/Time   CHOL 110 12/16/2019 1140   TRIG 92 12/16/2019 1140   HDL 32 (L) 12/16/2019 1140   CHOLHDL 3.4 12/16/2019 1140   CHOLHDL 3.6 08/08/2015 0845   VLDL 34 (H) 08/08/2015 0845   LDLCALC 60 12/16/2019 1140      Clinical Atherosclerotic Cardiovascular Disease (ASCVD): No  The ASCVD Risk score Mikey Bussing DC Jr., et al., 2013) failed to calculate for the following reasons:   The valid total cholesterol range is 130 to 320 mg/dL    A/P: Diabetes longstanding currently uncontrolled with A1C 10.2%, however sugars have significantly improved. Medication adherence appears optimal. Pt will schedule with Dr. 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 -Continued Lantus 45 units daily -Continued Victoza 1.8 mg daily -Continued Metformin 1000 mg BID -Extensively discussed pathophysiology of diabetes, recommended lifestyle interventions, dietary effects on blood sugar control -Counseled on s/sx of and management of hypoglycemia and patient verbalized understanding -Next A1C anticipated before January 2022 per Dr. Margarita Rana. - Pt has an appt on 02/24/2020.   ASCVD risk - primary secondary prevention in patient with diabetes. Last LDL is controlled.  -Continued on Atorvastatin 40 mg daily -Recommend checking lipid panel - last one June 2020   Written patient instructions provided.  Total time in face to face counseling 25 minutes.   Follow up w/ PCP 02/24/2020.   Benard Halsted, PharmD, BCACP, [place] [phone] 1 reading in the 200s [phone]

## 2020-02-06 ENCOUNTER — Encounter: Payer: Self-pay | Admitting: Physical Therapy

## 2020-02-06 ENCOUNTER — Ambulatory Visit: Payer: 59 | Attending: Family Medicine | Admitting: Physical Therapy

## 2020-02-06 ENCOUNTER — Other Ambulatory Visit: Payer: Self-pay

## 2020-02-06 DIAGNOSIS — M6281 Muscle weakness (generalized): Secondary | ICD-10-CM | POA: Insufficient documentation

## 2020-02-06 DIAGNOSIS — M67911 Unspecified disorder of synovium and tendon, right shoulder: Secondary | ICD-10-CM | POA: Diagnosis present

## 2020-02-06 DIAGNOSIS — G8929 Other chronic pain: Secondary | ICD-10-CM | POA: Diagnosis present

## 2020-02-06 DIAGNOSIS — M25511 Pain in right shoulder: Secondary | ICD-10-CM | POA: Insufficient documentation

## 2020-02-06 DIAGNOSIS — M7541 Impingement syndrome of right shoulder: Secondary | ICD-10-CM | POA: Diagnosis present

## 2020-02-06 NOTE — Therapy (Signed)
[place], [place], [address] Phone: [phone]   Fax:  [phone]  Physical Therapy Treatment/Discharge  Patient Details  Name: Duwayne Matters MRN: [id number] Date of Birth: 12-Sep-1955 Referring Provider (PT): Charlott Rakes, MD   Encounter Date: 02/06/2020   PT End of Session - 02/06/20 0952    Visit Number 2    Number of Visits 7    Date for PT Re-Evaluation 03/12/20    Authorization Type [employer]    PT Start Time [phone]    PT Stop Time 1000    PT Time Calculation (min) 12 min    Activity Tolerance Patient tolerated treatment well    Behavior During Therapy [facility] for tasks assessed/performed           Past Medical History:  Diagnosis Date   Anemia    Arthritis    Back pain, lumbosacral 06/30/2013   Blood transfusion without reported diagnosis    Cataract    BEGINNING   DM type 2 (diabetes mellitus, type 2) ([place]) 06/30/2013   Gout    HTN (hypertension) 06/30/2013   Hypertension    Hypothyroid 06/30/2013    Past Surgical History:  Procedure Laterality Date   CYST EXCISION     FRACTURE SURGERY      There were no vitals filed for this visit.   Subjective Assessment - 02/06/20 0959    Subjective I am feeling better. I have been doing my exercises and am not having any pain.    Patient Stated Goals For the R shoulder to get better and for the pain of my shoulder to stop    Currently in Pain? No/denies              [facility] PT Assessment - 02/06/20 0001      AROM   Overall AROM Comments all WFL wihtout pain      Strength   Overall Strength Comments gross 5/5 without pain      Empty Can test   Findings Negative                                 PT Education - 02/06/20 1004    Education Details continue HEP and return if needed    Person(s) Educated Patient    Methods Explanation    Comprehension Verbalized understanding            PT Short  Term Goals - 02/06/20 0954      PT SHORT TERM GOAL #1   Title Pt will be Ind in an initail HEP    Status Achieved      PT SHORT TERM GOAL #2   Title Pt will voice understanding of measures to reduce and manage R shoulder pain    Status Achieved             PT Long Term Goals - 02/06/20 0954      PT LONG TERM GOAL #1   Title Pt will be Ind in a final HEP to maintain or progress achieved LOF    Status Achieved      PT LONG TERM GOAL #2   Title 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 He presented with full AROM and strength without pain and has achieved all of his goals. Encouraged him to continue with HEP and to contact us should any other issues arise.    PT Treatment/Interventions ADLs/Self Care Home Management;Cryotherapy;Electrical Stimulation;Iontophoresis 35m/ml Dexamethasone;Moist Heat;Ultrasound;Therapeutic exercise;Therapeutic activities;Patient/family education;Manual techniques;Dry needling;Taping;Vasopneumatic Device    PT Home Exercise Plan [place] Agree with Plan of Care Patient           Patient will benefit from skilled therapeutic intervention in order to improve the following deficits and impairments:  Decreased activity tolerance,Decreased strength,Impaired sensation,Pain,Obesity,Impaired UE functional use,Increased muscle spasms  Visit Diagnosis: Chronic right shoulder pain  Impingement syndrome of right shoulder  Muscle weakness (generalized)  Tendinopathy of rotator cuff, right     Problem List Patient Active Problem List   Diagnosis  Date Noted   Healthcare maintenance 07/08/2017   B12 deficiency 07/08/2017   Carpal tunnel syndrome 08/01/2015   DM [place], [place] 06/[place]   HTN (hypertension) 06/30/2013   Gout 06/30/2013   Back pain, lumbosacral 06/30/2013   Testicular cyst s/p surgery 06/30/2013   Hypothyroidism 06/30/2013    PHYSICAL THERAPY DISCHARGE SUMMARY  Visits from Start of Care: 2  Current functional level related to goals / functional outcomes: See above   Remaining deficits: See above   Education / Equipment: Anatomy of condition, POC, HEP, exercise form/[place]  Plan: Patient agrees to discharge.  Patient goals were met. Patient is being discharged due to meeting the stated rehab goals.  ?????     Reannon Candella C. Tymesha Ditmore PT, DPT 02/06/20 10:08 AM   [place] [address]Phone: [phone]  Fax:  [phone] Name: RLorin GawronMRN: [id number] of Birth: [birth year]

## 2020-02-13 ENCOUNTER — Ambulatory Visit: Payer: 59 | Admitting: Physical Therapy

## 2020-02-23 ENCOUNTER — Other Ambulatory Visit: Payer: Self-pay

## 2020-02-23 ENCOUNTER — Ambulatory Visit (HOSPITAL_COMMUNITY)
Admission: EM | Admit: 2020-02-23 | Discharge: 2020-02-23 | Disposition: A | Discharge status: Home / Self Care | Payer: 59 | Attending: Family Medicine | Admitting: Family Medicine

## 2020-02-23 ENCOUNTER — Other Ambulatory Visit (HOSPITAL_COMMUNITY): Payer: Self-pay | Admitting: Family Medicine

## 2020-02-23 DIAGNOSIS — R5383 Other fatigue: Secondary | ICD-10-CM | POA: Insufficient documentation

## 2020-02-23 DIAGNOSIS — Z885 Allergy status to narcotic agent status: Secondary | ICD-10-CM | POA: Diagnosis not present

## 2020-02-23 DIAGNOSIS — Z87891 Personal history of nicotine dependence: Secondary | ICD-10-CM | POA: Insufficient documentation

## 2020-02-23 DIAGNOSIS — Z20822 Contact with and (suspected) exposure to covid-19: Secondary | ICD-10-CM | POA: Diagnosis not present

## 2020-02-23 DIAGNOSIS — R197 Diarrhea, unspecified: Secondary | ICD-10-CM | POA: Diagnosis not present

## 2020-02-23 DIAGNOSIS — R059 Cough, unspecified: Secondary | ICD-10-CM | POA: Diagnosis present

## 2020-02-23 DIAGNOSIS — R52 Pain, unspecified: Secondary | ICD-10-CM | POA: Insufficient documentation

## 2020-02-23 LAB — SARS CORONAVIRUS 2 (TAT 6-24 HRS): SARS Coronavirus 2: NEGATIVE

## 2020-02-23 MED ORDER — BENZONATATE 100 MG PO CAPS: ORAL_CAPSULE | ORAL | 0 refills | Status: DC

## 2020-02-23 MED FILL — BENZONATATE 100 MG CAPS: 100 | 7 days supply | Qty: 21 | Fill #0

## 2020-02-23 NOTE — Discharge Instructions (Addendum)
You have been tested for COVID-19 today. If your test returns positive, you will receive a phone call from [facility] regarding your results. Negative test results are not called. Both positive and negative results area always visible on MyChart. If you do not have a MyChart account, sign up instructions are provided in your discharge papers. Please do not hesitate to contact us should you have questions or concerns.  Be aware, your cough medication may cause drowsiness. Please do not drive, operate heavy machinery or make important decisions while on this medication, it can cloud your judgement.  

## 2020-02-23 NOTE — ED Provider Notes (Signed)
[facility]   [id number] 02/23/20 Arrival Time: 0909  ASSESSMENT & PLAN:  1. Cough   2. Body aches   3. Diarrhea, unspecified type     Reports he is beginning to feel a little better. Tolerating PO fluids without difficulty. COVID-19 testing sent. See letter/work note on file for self-isolation guidelines. OTC symptom care as needed.  Meds ordered this encounter  Medications  . benzonatate (TESSALON) 100 MG capsule    Sig: Take 1 capsule by mouth every 8 (eight) hours for cough.    Dispense:  21 capsule    Refill:  0     Follow-up Information    Hoy Register, MD.   Specialty: Family Medicine Why: As needed. Contact information: [address] [place] [address] [phone]               Reviewed expectations re: course of current medical issues. Questions answered. Outlined signs and symptoms indicating need for more acute intervention. Understanding verbalized. After Visit Summary given.   SUBJECTIVE: History from: patient. Ray Garner is a 64 y.o. male who presents with worries regarding COVID-19. Known COVID-19 contact: none. Recent travel: none. Reports: one week of coughing, subj fever, non-bloody diarrhea. No nausea. Denies: difficulty breathing. Is fatigued.   OBJECTIVE:  Vitals:   02/23/20 1049 02/23/20 1052  BP: 120/78   Pulse: 81   Resp: 18   Temp: 98.2 F (36.8 C)   TempSrc: Oral   SpO2: 96%   Weight:  121.6 kg  Height:  6' (1.829 m)    General appearance: alert; no distress Eyes: PERRLA; EOMI; conjunctiva normal HENT: [place]; AT; without nasal congestion Neck: supple  Lungs: speaks full sentences without difficulty; unlabored Extremities: no edema Skin: warm and dry Neurologic: normal gait Psychological: alert and cooperative; normal mood and affect  Labs:  Labs Reviewed  SARS CORONAVIRUS 2 (TAT 6-24 HRS)    Allergies  Allergen Reactions  . Vicodin [Hydrocodone-Acetaminophen] Hives and Rash    Past  Medical History:  Diagnosis Date  . Anemia   . Arthritis   . Back pain, lumbosacral 06/30/2013  . Blood transfusion without reported diagnosis   . Cataract    BEGINNING  . DM type 2 (diabetes mellitus, type 2) (HCC) 06/30/2013  . Gout   . HTN (hypertension) 06/30/2013  . Hypertension   . Hypothyroid 06/30/2013   Social History   Socioeconomic History  . Marital status: Divorced    Spouse name: Not on file  . Number of children: Not on file  . Years of education: Not on file  . Highest education level: Not on file  Occupational History  . Not on file  Tobacco Use  . Smoking status: Former Smoker    Quit date: 05/14/1983    Years since quitting: 36.8  . Smokeless tobacco: Former Clinical biochemist  . Vaping Use: Former  Substance and Sexual Activity  . Alcohol use: Yes    Comment: OCC.  . Drug use: No  . Sexual activity: Not on file  Other Topics Concern  . Not on file  Social History Narrative  . Not on file   Social Determinants of Health   Financial Resource Strain: Not on file  Food Insecurity: Not on file  Transportation Needs: Not on file  Physical Activity: Not on file  Stress: Not on file  Social Connections: Not on file  Intimate Partner Violence: Not on file   Family History  Problem Relation Age of Onset  .  Breast cancer Niece   . Colon cancer Neg Hx   . Colon polyps Neg Hx   . Esophageal cancer Neg Hx   . Rectal cancer Neg Hx   . Stomach cancer Neg Hx    Past Surgical History:  Procedure Laterality Date  . CYST EXCISION    . FRACTURE SURGERY       Vanessa Kick, MD 02/23/20 1112

## 2020-02-23 NOTE — ED Triage Notes (Signed)
PT reports for one week he has had a cough ,fever and diarrhea.

## 2020-02-24 ENCOUNTER — Ambulatory Visit: Payer: 59 | Attending: Family Medicine | Admitting: Family Medicine

## 2020-02-24 ENCOUNTER — Encounter: Payer: Self-pay | Admitting: Family Medicine

## 2020-02-24 VITALS — BP 143/79 | HR 76 | Ht 72.0 in | Wt 261.0 lb

## 2020-02-24 DIAGNOSIS — E1159 Type 2 diabetes mellitus with other circulatory complications: Secondary | ICD-10-CM | POA: Diagnosis not present

## 2020-02-24 DIAGNOSIS — E1142 Type 2 diabetes mellitus with diabetic polyneuropathy: Secondary | ICD-10-CM | POA: Diagnosis not present

## 2020-02-24 DIAGNOSIS — Z794 Long term (current) use of insulin: Secondary | ICD-10-CM | POA: Diagnosis not present

## 2020-02-24 DIAGNOSIS — E1165 Type 2 diabetes mellitus with hyperglycemia: Secondary | ICD-10-CM

## 2020-02-24 DIAGNOSIS — I152 Hypertension secondary to endocrine disorders: Secondary | ICD-10-CM

## 2020-02-24 DIAGNOSIS — B349 Viral infection, unspecified: Secondary | ICD-10-CM

## 2020-02-24 LAB — POCT GLYCOSYLATED HEMOGLOBIN (HGB A1C): Hemoglobin A1C: 7.5 % — AB (ref 4.0–5.6)

## 2020-02-24 LAB — GLUCOSE, POCT (MANUAL RESULT ENTRY): POC Glucose: 110 mg/dl — AB (ref 70–99)

## 2020-02-24 MED ORDER — VICTOZA 18 MG/3ML ~~LOC~~ SOPN: 1.8000 mg | PEN_INJECTOR | Freq: Every day | SUBCUTANEOUS | 6 refills | Status: DC

## 2020-02-24 MED ORDER — GABAPENTIN 300 MG PO CAPS: 300.0000 mg | ORAL_CAPSULE | Freq: Every day | ORAL | 6 refills | Status: DC

## 2020-02-24 MED FILL — GABAPENTIN 300 MG CAPSULE: 300 | 30 days supply | Qty: 30 | Fill #0

## 2020-02-24 NOTE — Progress Notes (Signed)
Subjective:  Patient ID: Ray Garner, male    DOB: [birth year]  Age: 64 y.o. MRN: [id number]  CC: Diabetes   HPI Ray Garner is a 64 year old male with history of type 2 diabetes mellitus (A1c 7.5), hypertension, hypothyroidism, Gouthere for follow-up visit. He is A1c 7.5 which is down from 10.2 previously.  He endorses compliance with his medications and has lost 6 pounds since his last visit two and a half months ago. Compliant with his antihypertensive his gout medication and levothyroxine. For the last 1 week, he has had nasal congestion, body aches, sweating and cough, diarrhea which has improved; Covid test from yesterday was negative.  Past Medical History:  Diagnosis Date   Anemia    Arthritis    Back pain, lumbosacral 06/30/2013   Blood transfusion without reported diagnosis    Cataract    BEGINNING   DM type 2 (diabetes mellitus, type 2) (HCC) 06/30/2013   Gout    HTN (hypertension) 06/30/2013   Hypertension    Hypothyroid 06/30/2013    Past Surgical History:  Procedure Laterality Date   CYST EXCISION     FRACTURE SURGERY      Family History  Problem Relation Age of Onset   Breast cancer Niece    Colon cancer Neg Hx    Colon polyps Neg Hx    Esophageal cancer Neg Hx    Rectal cancer Neg Hx    Stomach cancer Neg Hx     Allergies  Allergen Reactions   Vicodin [Hydrocodone-Acetaminophen] Hives and Rash    Outpatient Medications Prior to Visit  Medication Sig Dispense Refill   allopurinol (ZYLOPRIM) 300 MG tablet Take 1 tablet (300 mg total) by mouth daily. 90 tablet 1   amLODipine (NORVASC) 10 MG tablet TAKE 1 TABLET (10 MG TOTAL) BY MOUTH DAILY. 90 tablet 1   atorvastatin (LIPITOR) 40 MG tablet Take 1 tablet (40 mg total) by mouth daily. 90 tablet 1   carvedilol (COREG) 6.25 MG tablet Take 1 tablet (6.25 mg total) by mouth 2 (two) times daily with a meal. 60 tablet 6   Continuous Blood Gluc Receiver (FREESTYLE LIBRE 14 DAY READER) DEVI 1  each by Does not apply route 3 (three) times daily with meals. 1 Device 0   Continuous Blood Gluc Sensor (FREESTYLE LIBRE 14 DAY SENSOR) MISC 1 each by Does not apply route 3 (three) times daily with meals. 1 each 11   insulin glargine (LANTUS SOLOSTAR) 100 UNIT/ML Solostar Pen Inject 45 Units into the skin daily. 30 mL 6   levothyroxine (SYNTHROID) 200 MCG tablet TAKE 1 TABLET BY MOUTH ONCE DAILY BEFORE BREAKFAST 90 tablet 1   losartan (COZAAR) 100 MG tablet Take 1 tablet (100 mg total) by mouth daily. 90 tablet 1   meloxicam (MOBIC) 7.5 MG tablet Take 1 tablet (7.5 mg total) by mouth daily. 30 tablet 1   metFORMIN (GLUCOPHAGE) 500 MG tablet TAKE 2 TABLETS BY MOUTH TWICE DAILY WITH A MEAL 360 tablet 1   metoCLOPramide (REGLAN) 5 MG tablet Take 1 tablet (5 mg total) by mouth 3 (three) times daily before meals. 90 tablet 1   omeprazole (PRILOSEC) 20 MG capsule Take 1 capsule (20 mg total) by mouth daily. 90 capsule 1   tiZANidine (ZANAFLEX) 4 MG tablet Take 1 tablet (4 mg total) by mouth every 8 (eight) hours as needed for muscle spasms. 60 tablet 1   TRUEPLUS PEN NEEDLES 31G X 6 MM MISC USE AS DIRECTED AT BEDTIME 100 each  5   vitamin B-12 (CYANOCOBALAMIN) 1000 MCG tablet Take 1,000 mcg by mouth daily.     gabapentin (NEURONTIN) 300 MG capsule Take 1 capsule (300 mg total) by mouth at bedtime. 30 capsule 3   liraglutide (VICTOZA) 18 MG/3ML SOPN Inject 1.8 mg into the skin daily with breakfast. 9 mL 3   benzonatate (TESSALON) 100 MG capsule Take 1 capsule by mouth every 8 (eight) hours for cough. (Patient not taking: Reported on 02/24/2020) 21 capsule 0   fluticasone (FLONASE) 50 MCG/ACT nasal spray Place 2 sprays into both nostrils daily. (Patient not taking: No sig reported) 16 g 1   glucose blood (BAYER CONTOUR TEST) test strip Use as instructed (Patient not taking: No sig reported) 100 each 12   hydrocortisone 1 % ointment Apply 1 application topically 2 (two) times daily.  (Patient not taking: No sig reported) 30 g 1   meclizine (ANTIVERT) 25 MG tablet Take 1 tablet (25 mg total) by mouth 2 (two) times daily as needed for dizziness. (Patient not taking: No sig reported) 30 tablet 0   Spacer/Aero-Holding Chambers (AEROCHAMBER PLUS) inhaler Use as instructed (Patient not taking: No sig reported) 1 each 2   No facility-administered medications prior to visit.     ROS Review of Systems  Constitutional: Negative for activity change and appetite change.  HENT: Positive for congestion. Negative for sinus pressure and sore throat.   Eyes: Negative for visual disturbance.  Respiratory: Negative for cough, chest tightness and shortness of breath.   Cardiovascular: Negative for chest pain and leg swelling.  Gastrointestinal: Positive for diarrhea. Negative for abdominal distention, abdominal pain and constipation.  Endocrine: Negative.   Genitourinary: Negative for dysuria.  Musculoskeletal: Positive for myalgias. Negative for joint swelling.  Skin: Negative for rash.  Allergic/Immunologic: Negative.   Neurological: Negative for weakness, light-headedness and numbness.  Psychiatric/Behavioral: Negative for dysphoric mood and suicidal ideas.    Objective:  BP (!) 143/79    Pulse 76    Ht 6' (1.829 m)    Wt 261 lb (118.4 kg)    SpO2 97%    BMI 35.40 kg/m   BP/Weight 02/24/2020 02/23/2020 [id number]  Systolic BP [id number]  Diastolic BP 79 78 84  Wt. (Lbs) 261 268 268.8  BMI 35.4 36.35 36.46      Physical Exam Constitutional:      Appearance: He is well-developed.  Neck:     Vascular: No JVD.  Cardiovascular:     Rate and Rhythm: Normal rate.     Heart sounds: Normal heart sounds. No murmur heard.   Pulmonary:     Effort: Pulmonary effort is normal.     Breath sounds: Normal breath sounds. No wheezing or rales.  Chest:     Chest wall: No tenderness.  Abdominal:     General: Bowel sounds are normal. There is no distension.     Palpations:  Abdomen is soft. There is no mass.     Tenderness: There is no abdominal tenderness.  Musculoskeletal:        General: Normal range of motion.     Right lower leg: No edema.     Left lower leg: No edema.  Neurological:     Mental Status: He is alert and oriented to person, place, and time.  Psychiatric:        Mood and Affect: Mood normal.     CMP Latest Ref Rng & Units 12/16/2019 09/15/2019 08/13/2018  Glucose 65 - 99 mg/dL 111(H) 219(H)  184(H)  BUN 8 - 27 mg/dL 7(L) 8 8  Creatinine 0.76 - 1.27 mg/dL 0.79 0.97 0.93  Sodium 134 - 144 mmol/L 142 141 142  Potassium 3.5 - 5.2 mmol/L 3.9 4.1 4.1  Chloride 96 - 106 mmol/L 104 102 103  CO2 20 - 29 mmol/L 23 25 22   Calcium 8.6 - 10.2 mg/dL 9.3 8.8 9.5  Total Protein 6.0 - 8.5 g/dL 7.4 7.1 7.2  Total Bilirubin 0.0 - 1.2 mg/dL 0.5 0.4 0.3  Alkaline Phos 44 - 121 IU/L 102 84 88  AST 0 - 40 IU/L 19 17 16   ALT 0 - 44 IU/L 16 11 16     Lipid Panel     Component Value Date/Time   CHOL 110 12/16/2019 1140   TRIG 92 12/16/2019 1140   HDL 32 (L) 12/16/2019 1140   CHOLHDL 3.4 12/16/2019 1140   CHOLHDL 3.6 08/08/2015 0845   VLDL 34 (H) 08/08/2015 0845   LDLCALC 60 12/16/2019 1140    CBC    Component Value Date/Time   WBC 10.4 08/28/2018 1507   WBC 9.1 07/08/2017 1541   RBC 4.67 08/28/2018 1507   RBC 4.95 07/08/2017 1541   HGB 13.2 08/28/2018 1507   HCT 39.0 08/28/2018 1507   PLT 274 08/28/2018 1507   MCV 84 08/28/2018 1507   MCH 28.3 08/28/2018 1507   MCH 28.2 12/19/2016 1430   MCHC 33.8 08/28/2018 1507   MCHC 32.9 07/08/2017 1541   RDW 13.4 08/28/2018 1507   LYMPHSABS 3.6 (H) 08/28/2018 1507   MONOABS 0.5 12/06/2010 1638   EOSABS 0.2 08/28/2018 1507   BASOSABS 0.0 08/28/2018 1507    Lab Results  Component Value Date   HGBA1C 7.5 (A) 02/24/2020    Assessment & Plan:  1. Type 2 diabetes mellitus with hyperglycemia, with long-term current use of insulin (HCC) Controlled with A1c of 7.5 down from 10.2 Goal is  <7.0 Counseled on Diabetic diet, my plate method, [id number] minutes of moderate intensity exercise/week Blood sugar logs with fasting goals of 80-120 mg/dl, random of less than 180 and in the event of sugars less than 60 mg/dl or greater than 400 mg/dl encouraged to notify the clinic. Advised on the need for annual eye exams, annual foot exams, Pneumonia vaccine. - POCT glucose (manual entry) - POCT glycosylated hemoglobin (Hb A1C) - liraglutide (VICTOZA) 18 MG/3ML SOPN; Inject 1.8 mg into the skin daily with breakfast.  Dispense: 9 mL; Refill: 6  2. Diabetic polyneuropathy associated with type 2 diabetes mellitus (HCC) Controlled - gabapentin (NEURONTIN) 300 MG capsule; Take 1 capsule (300 mg total) by mouth at bedtime.  Dispense: 30 capsule; Refill: 6  3. Hypertension associated with diabetes ([place]) Slightly above goal No regimen change today but he will work on lifestyle modifications Counseled on blood pressure goal of less than 130/80, low-sodium, DASH diet, medication compliance, 150 minutes of moderate intensity exercise per week. Discussed medication compliance, adverse effects.  4. Viral syndrome SARS-CoV-2 negative He will pick up his antitussive prescribed from the urgent care Discussed symptomatic management   Meds ordered this encounter  Medications   liraglutide (VICTOZA) 18 MG/3ML SOPN    Sig: Inject 1.8 mg into the skin daily with breakfast.    Dispense:  9 mL    Refill:  6   gabapentin (NEURONTIN) 300 MG capsule    Sig: Take 1 capsule (300 mg total) by mouth at bedtime.    Dispense:  30 capsule    Refill:  6  Follow-up: Return in about 3 months (around 05/24/2020) for Chronic disease management.       Charlott Rakes, MD, FAAFP. [facility] [place], [place]   02/24/2020, 4:51 PM

## 2020-03-01 ENCOUNTER — Telehealth: Payer: Self-pay | Admitting: Family Medicine

## 2020-03-01 NOTE — Telephone Encounter (Signed)
Form ha been received and placed in PCP box for signature.

## 2020-03-01 NOTE — Telephone Encounter (Signed)
Pt dropped off Insulin treated diabetes mellitus assessment form to be filled out by Dr. Alvis Lemmings. Form placed in PCP box

## 2020-03-11 NOTE — Telephone Encounter (Signed)
Pt calling to f/up on his paperwork / please advise

## 2020-03-16 NOTE — Telephone Encounter (Signed)
Pt was called and informed that paperwork is ready for pick up. 

## 2020-05-24 ENCOUNTER — Ambulatory Visit: Payer: 59 | Admitting: Family Medicine

## 2020-09-11 ENCOUNTER — Other Ambulatory Visit: Payer: Self-pay

## 2020-09-11 ENCOUNTER — Encounter (HOSPITAL_COMMUNITY): Payer: Self-pay | Admitting: Emergency Medicine

## 2020-09-11 ENCOUNTER — Emergency Department (HOSPITAL_COMMUNITY): Payer: 59

## 2020-09-11 ENCOUNTER — Emergency Department (HOSPITAL_COMMUNITY)
Admission: EM | Admit: 2020-09-11 | Discharge: 2020-09-11 | Disposition: A | Discharge status: Home / Self Care | Payer: 59 | Attending: Emergency Medicine | Admitting: Emergency Medicine

## 2020-09-11 DIAGNOSIS — E039 Hypothyroidism, unspecified: Secondary | ICD-10-CM | POA: Diagnosis not present

## 2020-09-11 DIAGNOSIS — E119 Type 2 diabetes mellitus without complications: Secondary | ICD-10-CM | POA: Insufficient documentation

## 2020-09-11 DIAGNOSIS — R0781 Pleurodynia: Secondary | ICD-10-CM | POA: Insufficient documentation

## 2020-09-11 DIAGNOSIS — Z79899 Other long term (current) drug therapy: Secondary | ICD-10-CM | POA: Diagnosis not present

## 2020-09-11 DIAGNOSIS — Z794 Long term (current) use of insulin: Secondary | ICD-10-CM | POA: Insufficient documentation

## 2020-09-11 DIAGNOSIS — Z87891 Personal history of nicotine dependence: Secondary | ICD-10-CM | POA: Diagnosis not present

## 2020-09-11 DIAGNOSIS — K5900 Constipation, unspecified: Secondary | ICD-10-CM | POA: Diagnosis not present

## 2020-09-11 DIAGNOSIS — Z7984 Long term (current) use of oral hypoglycemic drugs: Secondary | ICD-10-CM | POA: Insufficient documentation

## 2020-09-11 DIAGNOSIS — I10 Essential (primary) hypertension: Secondary | ICD-10-CM | POA: Diagnosis not present

## 2020-09-11 DIAGNOSIS — T148XXA Other injury of unspecified body region, initial encounter: Secondary | ICD-10-CM

## 2020-09-11 DIAGNOSIS — R109 Unspecified abdominal pain: Secondary | ICD-10-CM | POA: Diagnosis present

## 2020-09-11 DIAGNOSIS — R0789 Other chest pain: Secondary | ICD-10-CM

## 2020-09-11 LAB — COMPREHENSIVE METABOLIC PANEL
ALT: 21 U/L (ref 0–44)
AST: 21 U/L (ref 15–41)
Albumin: 4 g/dL (ref 3.5–5.0)
Alkaline Phosphatase: 76 U/L (ref 38–126)
Anion gap: 6 (ref 5–15)
BUN: 10 mg/dL (ref 8–23)
CO2: 28 mmol/L (ref 22–32)
Calcium: 9.4 mg/dL (ref 8.9–10.3)
Chloride: 102 mmol/L (ref 98–111)
Creatinine, Ser: 0.9 mg/dL (ref 0.61–1.24)
GFR, Estimated: >60 mL/min (ref >60)
Glucose, Bld: 175 mg/dL — ABNORMAL HIGH (ref 70–99)
Potassium: 4.6 mmol/L (ref 3.5–5.1)
Sodium: 136 mmol/L (ref 135–145)
Total Bilirubin: 0.7 mg/dL (ref 0.3–1.2)
Total Protein: 8.1 g/dL (ref 6.5–8.1)

## 2020-09-11 LAB — CBC
HCT: 47.3 % (ref 39.0–52.0)
Hemoglobin: 15.3 g/dL (ref 13.0–17.0)
MCH: 28.4 pg (ref 26.0–34.0)
MCHC: 32.3 g/dL (ref 30.0–36.0)
MCV: 87.8 fL (ref 80.0–100.0)
Platelets: 241 10*3/uL (ref 150–400)
RBC: 5.39 MIL/uL (ref 4.22–5.81)
RDW: 13.2 % (ref 11.5–15.5)
WBC: 8.5 10*3/uL (ref 4.0–10.5)
nRBC: 0 % (ref 0.0–0.2)

## 2020-09-11 LAB — LIPASE, BLOOD: Lipase: 30 U/L (ref 11–51)

## 2020-09-11 LAB — URINALYSIS, ROUTINE W REFLEX MICROSCOPIC
Bacteria, UA: NONE SEEN
Bilirubin Urine: NEGATIVE
Glucose, UA: NEGATIVE mg/dL
Hgb urine dipstick: NEGATIVE
Ketones, ur: NEGATIVE mg/dL
Nitrite: NEGATIVE
Protein, ur: NEGATIVE mg/dL
Specific Gravity, Urine: 1.014 (ref 1.005–1.030)
pH: 8 (ref 5.0–8.0)

## 2020-09-11 MED ORDER — CYCLOBENZAPRINE HCL 5 MG PO TABS: 5.0000 mg | ORAL_TABLET | Freq: Three times a day (TID) | ORAL | 0 refills | Status: DC | PRN

## 2020-09-11 MED ORDER — ONDANSETRON HCL 4 MG/2ML IJ SOLN
4.0000 mg | Freq: Once | INTRAMUSCULAR | Status: AC
  Administered 2020-09-11: 4 mg via INTRAVENOUS
  Filled 2020-09-11: qty 2

## 2020-09-11 MED ORDER — POLYETHYLENE GLYCOL 3350 17 G PO PACK: 17.0000 g | PACK | Freq: Every day | ORAL | 0 refills | Status: DC

## 2020-09-11 MED ORDER — IBUPROFEN 800 MG PO TABS
800.0000 mg | ORAL_TABLET | Freq: Three times a day (TID) | ORAL | 0 refills | Status: DC
Start: 2020-09-11 — End: 2023-11-12

## 2020-09-11 MED ORDER — IOHEXOL 350 MG/ML SOLN
100.0000 mL | Freq: Once | INTRAVENOUS | Status: AC | PRN
  Administered 2020-09-11: 100 mL via INTRAVENOUS

## 2020-09-11 MED ORDER — MORPHINE SULFATE (PF) 4 MG/ML IV SOLN
4.0000 mg | Freq: Once | INTRAVENOUS | Status: AC
  Administered 2020-09-11: 4 mg via INTRAVENOUS
  Filled 2020-09-11: qty 1

## 2020-09-11 NOTE — Discharge Instructions (Addendum)
Your CT scan did not show any fractures or blood clots.  You are constipated so please take MiraLAX daily  Take Motrin for pain and Flexeril for muscle spasms.  Rest at home for 2 days   See your doctor for follow-up  Return to ER if you have worse chest pain, abdominal pain, vomiting, fever

## 2020-09-11 NOTE — ED Triage Notes (Signed)
Patient from home complaint of flank pain and abdominal pain since Tuesday.

## 2020-09-11 NOTE — ED Notes (Signed)
Patient transported to CT 

## 2020-09-11 NOTE — ED Provider Notes (Signed)
[facility] EMERGENCY DEPARTMENT Provider Note   CSN: [id number] Arrival date & time: 09/11/20  1327     History Chief Complaint  Patient presents with   Abdominal Pain   Flank Pain    Ray Garner is a 65 y.o. male history of diabetes, hypertension, hyperlipidemia here presenting with abdominal pain and flank pain and chest pain.  Its that he just drove down from [place] recently.  He states that there was something wrong with his air conditioning and is trying to fix it and had a sudden onset of pleuritic chest pain on the right side.  He states that it happened about 5 days ago and radiate to his abdomen.  He states that he has not been able to feel comfortable since then.  He states that he has some pain when he takes a deep breath as well as movement.  Denies any fall or trauma or injury.  Patient is not on blood thinners  The history is provided by the patient.      Past Medical History:  Diagnosis Date   Anemia    Arthritis    Back pain, lumbosacral 06/30/2013   Blood transfusion without reported diagnosis    Cataract    BEGINNING   DM type 2 (diabetes mellitus, type 2) ([place]) 06/30/2013   Gout    HTN (hypertension) 06/30/2013   Hypertension    Hypothyroid 06/30/2013    Patient Active Problem List   Diagnosis Date Noted   Healthcare maintenance 07/08/2017   B12 deficiency 07/08/2017   Carpal tunnel syndrome 08/01/2015   DM type 2 (diabetes mellitus, type 2) ([place]) 06/30/2013   HTN (hypertension) 06/30/2013   Gout 06/30/2013   Back pain, lumbosacral 06/30/2013   Testicular cyst s/p surgery 06/30/2013   Hypothyroidism 06/30/2013    Past Surgical History:  Procedure Laterality Date   CYST EXCISION     FRACTURE SURGERY         Family History  Problem Relation Age of Onset   Breast cancer Niece    Colon cancer Neg Hx    Colon polyps Neg Hx    Esophageal cancer Neg Hx    Rectal cancer Neg Hx    Stomach cancer Neg Hx     Social History    Tobacco Use   Smoking status: Former    Types: Cigarettes    Quit date: 05/14/1983    Years since quitting: 37.3   Smokeless tobacco: Former  Scientific laboratory technician Use: Former  Substance Use Topics   Alcohol use: Yes    Comment: OCC.   Drug use: No    Home Medications Prior to Admission medications   Medication Sig Start Date End Date Taking? Authorizing Provider  allopurinol (ZYLOPRIM) 300 MG tablet TAKE 1 TABLET (300 MG TOTAL) BY MOUTH DAILY. 12/16/19 12/15/20  Charlott Rakes, MD  amLODipine (NORVASC) 10 MG tablet TAKE 1 TABLET (10 MG TOTAL) BY MOUTH DAILY. 12/16/19 12/15/20  Charlott Rakes, MD  atorvastatin (LIPITOR) 40 MG tablet TAKE 1 TABLET (40 MG TOTAL) BY MOUTH DAILY. 12/16/19 12/15/20  Charlott Rakes, MD  benzonatate (TESSALON) 100 MG capsule TAKE 1 CAPSULE BY MOUTH EVERY 8 (EIGHT) HOURS FOR COUGH. Patient not taking: Reported on 02/24/2020 02/23/20 02/22/21  Vanessa Kick, MD  carvedilol (COREG) 6.25 MG tablet TAKE 1 TABLET (6.25 MG TOTAL) BY MOUTH 2 (TWO) TIMES DAILY WITH A MEAL. 12/16/19 12/15/20  Charlott Rakes, MD  Continuous Blood Gluc Receiver (FREESTYLE LIBRE 14 DAY READER)  DEVI 1 each by Does not apply route 3 (three) times daily with meals. 07/11/17   Charlott Rakes, MD  Continuous Blood Gluc Sensor (FREESTYLE LIBRE 14 DAY SENSOR) MISC 1 each by Does not apply route 3 (three) times daily with meals. 01/29/20   Charlott Rakes, MD  fluticasone (FLONASE) 50 MCG/ACT nasal spray Place 2 sprays into both nostrils daily. Patient not taking: No sig reported 08/28/18   Charlott Rakes, MD  gabapentin (NEURONTIN) 300 MG capsule Take 1 capsule (300 mg total) by mouth at bedtime. 02/24/20   Charlott Rakes, MD  glucose blood (BAYER CONTOUR TEST) test strip Use as instructed Patient not taking: No sig reported 07/08/17   Libby Maw, MD  hydrocortisone 1 % ointment Apply 1 application topically 2 (two) times daily. Patient not taking: No sig reported 11/12/18    Charlott Rakes, MD  insulin glargine (LANTUS SOLOSTAR) 100 UNIT/ML Solostar Pen Inject 45 Units into the skin daily. 12/16/19   Charlott Rakes, MD  levothyroxine (SYNTHROID) 200 MCG tablet TAKE 1 TABLET BY MOUTH ONCE DAILY BEFORE BREAKFAST 12/17/19 12/16/20  Charlott Rakes, MD  liraglutide (VICTOZA) 18 MG/3ML SOPN Inject 1.8 mg into the skin daily with breakfast. 02/24/20   Charlott Rakes, MD  losartan (COZAAR) 100 MG tablet TAKE 1 TABLET (100 MG TOTAL) BY MOUTH DAILY. 12/16/19 12/15/20  Charlott Rakes, MD  meclizine (ANTIVERT) 25 MG tablet Take 1 tablet (25 mg total) by mouth 2 (two) times daily as needed for dizziness. Patient not taking: No sig reported 08/19/18   Kerin Perna, NP  meloxicam (MOBIC) 7.5 MG tablet TAKE 1 TABLET (7.5 MG TOTAL) BY MOUTH DAILY. 12/16/19 12/15/20  Charlott Rakes, MD  metFORMIN (GLUCOPHAGE) 500 MG tablet TAKE 2 TABLETS BY MOUTH TWICE DAILY WITH A MEAL 12/16/19 12/15/20  Charlott Rakes, MD  metoCLOPramide (REGLAN) 5 MG tablet Take 1 tablet (5 mg total) by mouth 3 (three) times daily before meals. 09/15/19   Charlott Rakes, MD  omeprazole (PRILOSEC) 20 MG capsule TAKE 1 CAPSULE (20 MG TOTAL) BY MOUTH DAILY. 12/16/19 12/15/20  Charlott Rakes, MD  Spacer/Aero-Holding Chambers (AEROCHAMBER PLUS) inhaler Use as instructed Patient not taking: No sig reported 04/03/18   Melynda Ripple, MD  tiZANidine (ZANAFLEX) 4 MG tablet TAKE 1 TABLET (4 MG TOTAL) BY MOUTH EVERY 8 (EIGHT) HOURS AS NEEDED FOR MUSCLE SPASMS. 12/16/19 12/15/20  Charlott Rakes, MD  TRUEPLUS PEN NEEDLES 31G X 6 MM MISC USE AS DIRECTED AT BEDTIME 01/28/20   Charlott Rakes, MD  vitamin B-12 (CYANOCOBALAMIN) 1000 MCG tablet Take 1,000 mcg by mouth daily.    [provider]    Allergies    Vicodin [hydrocodone-acetaminophen]  Review of Systems   Review of Systems  Respiratory:  Positive for shortness of breath.   Gastrointestinal:  Positive for abdominal pain.  Genitourinary:   Positive for flank pain.  All other systems reviewed and are negative.  Physical Exam Updated Vital Signs BP (!) 145/95   Pulse 80   Temp 98.3 F (36.8 C) (Oral)   Resp 19   SpO2 97%   Physical Exam Vitals and nursing note reviewed.  Constitutional:      Comments: Uncomfortable  HENT:     Head: Normocephalic.  Eyes:     Extraocular Movements: Extraocular movements intact.  Cardiovascular:     Rate and Rhythm: Normal rate and regular rhythm.     Heart sounds: Normal heart sounds.  Pulmonary:     Effort: Pulmonary effort is normal.  Comments: Crackles on the right base Abdominal:     General: Abdomen is flat.     Comments: Mild right upper quadrant and right flank tenderness.  Skin:    General: Skin is warm.     Capillary Refill: Capillary refill takes less than 2 seconds.  Neurological:     General: No focal deficit present.     Mental Status: He is oriented to person, place, and time.  Psychiatric:        Mood and Affect: Mood normal.        Behavior: Behavior normal.    ED Results / Procedures / Treatments   Labs (all labs ordered are listed, but only abnormal results are displayed) Labs Reviewed  COMPREHENSIVE METABOLIC PANEL - Abnormal; Notable for the following components:      Result Value   Glucose, Bld 175 (*)    All other components within normal limits  URINALYSIS, ROUTINE W REFLEX MICROSCOPIC - Abnormal; Notable for the following components:   Leukocytes,Ua TRACE (*)    All other components within normal limits  LIPASE, BLOOD  CBC    EKG None  Radiology CT Angio Chest PE W and/or Wo Contrast  Result Date: 09/11/2020 CLINICAL DATA:  Flank and abdominal pain, short of breath, recent travel EXAM: CT ANGIOGRAPHY CHEST WITH CONTRAST TECHNIQUE: Multidetector CT imaging of the chest was performed using the standard protocol during bolus administration of intravenous contrast. Multiplanar CT image reconstructions and MIPs were obtained to evaluate the  vascular anatomy. CONTRAST:  158mL OMNIPAQUE IOHEXOL 350 MG/ML SOLN COMPARISON:  04/03/2018 FINDINGS: Cardiovascular: This is a technically adequate evaluation of the pulmonary vasculature. No filling defects or pulmonary emboli. The heart is unremarkable without pericardial effusion. Mild atherosclerosis of the coronary vasculature. Normal caliber of the thoracic aorta. Mediastinum/Nodes: No enlarged mediastinal, hilar, or axillary lymph nodes. Thyroid gland, trachea, and esophagus demonstrate no significant findings. Lungs/Pleura: Curvilinear areas of consolidation are seen at the right lung base, favor atelectasis. Trace right pleural fluid. No acute airspace disease or pneumothorax. The central airways are patent. Upper Abdomen: No acute abnormality. Musculoskeletal: No acute or destructive bony lesions. Reconstructed images demonstrate no additional findings. Review of the MIP images confirms the above findings. IMPRESSION: 1. No evidence of pulmonary embolus. 2. Trace right pleural effusion, with hypoventilatory changes at the right lung base. No acute airspace disease. Electronically Signed   By: Randa Ngo M.D.   On: 09/11/2020 16:45   CT ABDOMEN PELVIS W CONTRAST  Result Date: 09/11/2020 CLINICAL DATA:  Abdominal distension and flank pain. EXAM: CT ABDOMEN AND PELVIS WITH CONTRAST TECHNIQUE: Multidetector CT imaging of the abdomen and pelvis was performed using the standard protocol following bolus administration of intravenous contrast. CONTRAST:  181mL OMNIPAQUE IOHEXOL 350 MG/ML SOLN COMPARISON:  None. FINDINGS: Lower chest: Assessed on concurrent chest CT, reported separately. Hepatobiliary: No focal liver abnormality is seen. No gallstones, gallbladder wall thickening, or biliary dilatation. Pancreas: No ductal dilatation or inflammation. Spleen: Normal in size without focal abnormality. Adrenals/Urinary Tract: Normal adrenal glands. No hydronephrosis. No perinephric edema. There is early  excretion of IV contrast in the renal collecting systems which limits assessment for small stones. No ureteral calculi. Homogeneous renal enhancement. 8 x 13 mm low-density lesion in the upper right kidney may represent a cyst or small angiomyolipoma, both are benign entities. No suspicious renal lesion. Symmetric excretion on delayed phase imaging. Partially distended urinary bladder with mild bladder wall thickening. Stomach/Bowel: Decompressed stomach. No small bowel obstruction or inflammation.  Cecum is slightly high-riding in the mid abdomen. There is no terminal ileal inflammation. Normal appendix located in the right mid abdomen, for example series 4, image 65. Moderate colonic stool burden. Transverse and sigmoid colon are redundant. No colonic wall thickening or pericolonic inflammation. Vascular/Lymphatic: Mild aortic atherosclerosis. No aortic aneurysm. Patent portal vein. No enlarged lymph nodes in the abdomen or pelvis. Reproductive: Prostatic calcifications. Other: Small left upper quadrant nodule is isodense to adjacent spleen and likely splenule. There is no ascites no free air or focal fluid collection. Minimal fat in the inguinal canals. Musculoskeletal: Degenerative disc disease at L5-S1. There is multilevel facet hypertrophy in the lumbar spine. Mild anterior wedging of T12 vertebral body IMPRESSION: 1. Mild bladder wall thickening, can be seen with cystitis. 2. No other acute abnormality in the abdomen/pelvis. 3. Moderate colonic stool burden with colonic tortuosity, can be seen with constipation. Aortic Atherosclerosis (ICD10-I70.0). Electronically Signed   By: Keith Rake M.D.   On: 09/11/2020 16:49    Procedures Procedures   Medications Ordered in ED Medications  morphine 4 MG/ML injection 4 mg (4 mg Intravenous Given 09/11/20 1651)  ondansetron (ZOFRAN) injection 4 mg (4 mg Intravenous Given 09/11/20 1651)  iohexol (OMNIPAQUE) 350 MG/ML injection 100 mL (100 mLs Intravenous  Contrast Given 09/11/20 1629)    ED Course  I have reviewed the triage vital signs and the nursing notes.  Pertinent labs & imaging results that were available during my care of the patient were reviewed by me and considered in my medical decision making (see chart for details).    MDM Rules/Calculators/A&P                         Deyton Ellenbecker is a 65 y.o. male here presenting with right flank pain and right-sided chest pain.  I am concerned for possible PE given recent travel versus colitis versus cholecystitis. Plan to get CBC, CMP, CTA chest, CT ab/pel. Will give pain meds and reassess.   5:39 PM Labs unremarkable.  CT shows mild constipation.  Patient does not have any PE or rib fractures.  Of note, patient does have possible cystitis on CT but urinalysis did not show UTI and he has no urinary symptoms.  I think likely he has muscle strain.  Will discharge home with Motrin and Flexeril.    Final Clinical Impression(s) / ED Diagnoses Final diagnoses:  None    Rx / DC Orders ED Discharge Orders     None        Drenda Freeze, MD 09/11/20 1739

## 2020-10-13 ENCOUNTER — Other Ambulatory Visit: Payer: Self-pay | Admitting: Family Medicine

## 2020-10-13 ENCOUNTER — Other Ambulatory Visit: Payer: Self-pay

## 2020-10-13 DIAGNOSIS — M1A9XX0 Chronic gout, unspecified, without tophus (tophi): Secondary | ICD-10-CM

## 2020-10-13 DIAGNOSIS — E1159 Type 2 diabetes mellitus with other circulatory complications: Secondary | ICD-10-CM

## 2020-10-13 MED ORDER — ALLOPURINOL 300 MG PO TABS
300.0000 mg | ORAL_TABLET | Freq: Every day | ORAL | 0 refills | Status: DC
  Filled 2020-10-13: qty 30, 30d supply, fill #0

## 2020-10-13 MED ORDER — AMLODIPINE BESYLATE 10 MG PO TABS
10.0000 mg | ORAL_TABLET | Freq: Every day | ORAL | 0 refills | Status: DC
  Filled 2020-10-13: qty 30, 30d supply, fill #0

## 2020-10-13 MED ORDER — LOSARTAN POTASSIUM 100 MG PO TABS
100.0000 mg | ORAL_TABLET | Freq: Every day | ORAL | 0 refills | Status: DC
  Filled 2020-10-13: qty 30, 30d supply, fill #0

## 2020-10-13 MED FILL — Metformin HCl Tab 500 MG: ORAL | 90 days supply | Qty: 360 | Fill #0 | Status: CN

## 2020-10-13 NOTE — Telephone Encounter (Signed)
Requested medication (s) are due for refill today: no  Requested medication (s) are on the active medication list:  yes  Last refill: 05/10/2020  Future visit scheduled: no  Notes to clinic:  overdue for follow up and labs Message left for patient to contact office    Requested Prescriptions  Pending Prescriptions Disp Refills   allopurinol (ZYLOPRIM) 300 MG tablet 90 tablet 1    Sig: TAKE 1 TABLET (300 MG TOTAL) BY MOUTH DAILY.     Endocrinology:  Gout Agents Failed - 10/13/2020  9:23 AM      Failed - Uric Acid in normal range and within 360 days    Uric Acid, Serum  Date Value Ref Range Status  07/08/2017 5.0 4.0 - 7.8 mg/dL Final          Passed - Cr in normal range and within 360 days    Creat  Date Value Ref Range Status  04/11/2016 0.89 0.70 - 1.33 mg/dL Final    Comment:      For patients > or = 64 years of age: The upper reference limit for Creatinine is approximately 13% higher for people identified as African-American.      Creatinine, Ser  Date Value Ref Range Status  09/11/2020 0.90 0.61 - 1.24 mg/dL Final   Creatinine,U  Date Value Ref Range Status  07/08/2017 324.4 mg/dL Final   Creatinine, Urine  Date Value Ref Range Status  08/08/2015 77 20 - 370 mg/dL Final          Passed - Valid encounter within last 12 months    Recent Outpatient Visits           7 months ago Type 2 diabetes mellitus with hyperglycemia, with long-term current use of insulin ([place])   [place], Charlane Ferretti, MD   8 months ago Type 2 diabetes mellitus with hyperglycemia, with long-term current use of insulin ([place])   [place], Annie Main L, RPH-CPP   9 months ago Type 2 diabetes mellitus with hyperglycemia, with long-term current use of insulin [facility])   [place], Annie Main L, RPH-CPP   10 months ago Type 2 diabetes mellitus with hyperglycemia, with long-term  current use of insulin [facility])   [place], Annie Main L, RPH-CPP   10 months ago Type 2 diabetes mellitus with hyperglycemia, with long-term current use of insulin ([place])   [place], [place], MD               amLODipine (NORVASC) 10 MG tablet 90 tablet 1    Sig: TAKE 1 TABLET (10 MG TOTAL) BY MOUTH DAILY.     Cardiovascular:  Calcium Channel Blockers Failed - 10/13/2020  9:23 AM      Failed - Last BP in normal range    BP Readings from Last 1 Encounters:  09/11/20 (!) 143/100          Failed - Valid encounter within last 6 months    Recent Outpatient Visits           7 months ago Type 2 diabetes mellitus with hyperglycemia, with long-term current use of insulin ([place])   [place], [place], MD   8 months ago Type 2 diabetes mellitus with hyperglycemia, with long-term current use of insulin [facility])   [place],  Jarome Matin, RPH-CPP   9 months ago Type 2 diabetes mellitus with hyperglycemia, with long-term current use of insulin [facility])   [place], Annie Main L, RPH-CPP   10 months ago Type 2 diabetes mellitus with hyperglycemia, with long-term current use of insulin [facility])   [place], Annie Main L, RPH-CPP   10 months ago Type 2 diabetes mellitus with hyperglycemia, with long-term current use of insulin ([place])   [place], Charlane Ferretti, MD               losartan (COZAAR) 100 MG tablet 90 tablet 1    Sig: TAKE 1 TABLET (100 MG TOTAL) BY MOUTH DAILY.     Cardiovascular:  Angiotensin Receptor Blockers Failed - 10/13/2020  9:23 AM      Failed - Last BP in normal range    BP Readings from Last 1 Encounters:  09/11/20 (!) 143/100          Failed - Valid encounter within last 6 months    Recent Outpatient  Visits           7 months ago Type 2 diabetes mellitus with hyperglycemia, with long-term current use of insulin ([place])   [place], Charlane Ferretti, MD   8 months ago Type 2 diabetes mellitus with hyperglycemia, with long-term current use of insulin [facility])   [place], Annie Main L, RPH-CPP   9 months ago Type 2 diabetes mellitus with hyperglycemia, with long-term current use of insulin [facility])   [place], Annie Main L, RPH-CPP   10 months ago Type 2 diabetes mellitus with hyperglycemia, with long-term current use of insulin [facility])   [place], Annie Main L, RPH-CPP   10 months ago Type 2 diabetes mellitus with hyperglycemia, with long-term current use of insulin [facility])   [place], [place], MD              Passed - Cr in normal range and within 180 days    Creat  Date Value Ref Range Status  04/11/2016 0.89 0.70 - 1.33 mg/dL Final    Comment:      For patients > or = 64 years of age: The upper reference limit for Creatinine is approximately 13% higher for people identified as African-American.      Creatinine, Ser  Date Value Ref Range Status  09/11/2020 0.90 0.61 - 1.24 mg/dL Final   Creatinine,U  Date Value Ref Range Status  07/08/2017 324.4 mg/dL Final   Creatinine, Urine  Date Value Ref Range Status  08/08/2015 77 20 - 370 mg/dL Final          Passed - K in normal range and within 180 days    Potassium  Date Value Ref Range Status  09/11/2020 4.6 3.5 - 5.1 mmol/L Final          Passed - Patient is not pregnant

## 2020-10-20 ENCOUNTER — Other Ambulatory Visit: Payer: Self-pay

## 2020-10-25 ENCOUNTER — Other Ambulatory Visit: Payer: Self-pay | Admitting: Family Medicine

## 2020-10-25 DIAGNOSIS — Z794 Long term (current) use of insulin: Secondary | ICD-10-CM

## 2020-10-25 DIAGNOSIS — E1165 Type 2 diabetes mellitus with hyperglycemia: Secondary | ICD-10-CM

## 2020-10-26 NOTE — Telephone Encounter (Signed)
Patient will need an office visit for further refills. Requested Prescriptions  Pending Prescriptions Disp Refills  . VICTOZA 18 MG/3ML SOPN [Pharmacy Med Name: Victoza 18 MG/3ML Subcutaneous Solution Pen-injector] 9 mL 0    Sig: INJECT 1.8  MG SUBCUTANEOUSLY ONCE DAILY WITH BREAKFAST     Endocrinology:  Diabetes - GLP-1 Receptor Agonists Failed - 10/25/2020  9:39 AM      Failed - HBA1C is between 0 and 7.9 and within 180 days    Hemoglobin A1C  Date Value Ref Range Status  02/24/2020 7.5 (A) 4.0 - 5.6 % Final   HbA1c, POC (controlled diabetic range)  Date Value Ref Range Status  12/16/2019 10.2 (A) 0.0 - 7.0 % Final         Failed - Valid encounter within last 6 months    Recent Outpatient Visits          8 months ago Type 2 diabetes mellitus with hyperglycemia, with long-term current use of insulin ([place])   [place], [place], MD   9 months ago Type 2 diabetes mellitus with hyperglycemia, with long-term current use of insulin [facility])   [place], Annie Main L, RPH-CPP   10 months ago Type 2 diabetes mellitus with hyperglycemia, with long-term current use of insulin [facility])   [place], Annie Main L, RPH-CPP   10 months ago Type 2 diabetes mellitus with hyperglycemia, with long-term current use of insulin [facility])   [place], Annie Main L, RPH-CPP   10 months ago Type 2 diabetes mellitus with hyperglycemia, with long-term current use of insulin [facility])   [facility] Charlott Rakes, MD

## 2020-12-05 ENCOUNTER — Other Ambulatory Visit: Payer: Self-pay | Admitting: Family Medicine

## 2020-12-05 DIAGNOSIS — E1165 Type 2 diabetes mellitus with hyperglycemia: Secondary | ICD-10-CM

## 2020-12-06 NOTE — Telephone Encounter (Signed)
Requested medications are due for refill today yes  Requested medications are on the active medication list yes  Last refill 10/26/20  Last visit 02/24/20  Future visit scheduled no  Notes to clinic Has already had a curtesy refill and there is no upcoming appointment scheduled.

## 2021-01-05 ENCOUNTER — Other Ambulatory Visit (HOSPITAL_COMMUNITY): Payer: Self-pay

## 2021-02-08 ENCOUNTER — Ambulatory Visit: Payer: 59 | Attending: Family Medicine | Admitting: Family Medicine

## 2021-02-08 ENCOUNTER — Other Ambulatory Visit: Payer: Self-pay | Admitting: Family Medicine

## 2021-02-08 ENCOUNTER — Encounter: Payer: Self-pay | Admitting: Family Medicine

## 2021-02-08 ENCOUNTER — Other Ambulatory Visit: Payer: Self-pay

## 2021-02-08 VITALS — BP 147/83 | HR 88 | Ht 72.0 in | Wt 283.4 lb

## 2021-02-08 DIAGNOSIS — E1165 Type 2 diabetes mellitus with hyperglycemia: Secondary | ICD-10-CM

## 2021-02-08 DIAGNOSIS — M1A9XX0 Chronic gout, unspecified, without tophus (tophi): Secondary | ICD-10-CM | POA: Diagnosis not present

## 2021-02-08 DIAGNOSIS — E1159 Type 2 diabetes mellitus with other circulatory complications: Secondary | ICD-10-CM

## 2021-02-08 DIAGNOSIS — Z794 Long term (current) use of insulin: Secondary | ICD-10-CM

## 2021-02-08 DIAGNOSIS — E039 Hypothyroidism, unspecified: Secondary | ICD-10-CM | POA: Diagnosis not present

## 2021-02-08 DIAGNOSIS — I152 Hypertension secondary to endocrine disorders: Secondary | ICD-10-CM

## 2021-02-08 LAB — POCT GLYCOSYLATED HEMOGLOBIN (HGB A1C): HbA1c, POC (controlled diabetic range): 8.3 % — AB (ref 0.0–7.0)

## 2021-02-08 LAB — GLUCOSE, POCT (MANUAL RESULT ENTRY): POC Glucose: 237 mg/dl — AB (ref 70–99)

## 2021-02-08 MED ORDER — TRULICITY 1.5 MG/0.5ML ~~LOC~~ SOAJ: 1.5000 mg | SUBCUTANEOUS | 3 refills | Status: DC

## 2021-02-08 MED ORDER — LOSARTAN POTASSIUM 100 MG PO TABS: 100.0000 mg | ORAL_TABLET | Freq: Every day | ORAL | 6 refills | Status: DC

## 2021-02-08 MED ORDER — AMLODIPINE BESYLATE 10 MG PO TABS
10.0000 mg | ORAL_TABLET | Freq: Every day | ORAL | 3 refills | Status: DC
Start: 2021-02-08 — End: 2021-02-10

## 2021-02-08 MED ORDER — ALLOPURINOL 300 MG PO TABS: 300.0000 mg | ORAL_TABLET | Freq: Every day | ORAL | 3 refills | Status: DC

## 2021-02-08 NOTE — Patient Instructions (Signed)

## 2021-02-08 NOTE — Telephone Encounter (Signed)
Requested medication (s) are due for refill today - yes  Requested medication (s) are on the active medication list -yes  Future visit scheduled -yes  Last refill: 12/16/19 72ml 6RF  Notes to clinic: Request RF: call to patient- appointment made for today  Requested Prescriptions  Pending Prescriptions Disp Refills   LANTUS SOLOSTAR 100 UNIT/ML Solostar Pen [Pharmacy Med Name: Lantus SoloStar 100 UNIT/ML Subcutaneous Solution Pen-injector] 30 mL 0    Sig: Inject 45 Units into the skin daily.     Endocrinology:  Diabetes - Insulins Failed - 02/08/2021  9:09 AM      Failed - HBA1C is between 0 and 7.9 and within 180 days    Hemoglobin A1C  Date Value Ref Range Status  02/24/2020 7.5 (A) 4.0 - 5.6 % Final   HbA1c, POC (controlled diabetic range)  Date Value Ref Range Status  12/16/2019 10.2 (A) 0.0 - 7.0 % Final          Failed - Valid encounter within last 6 months    Recent Outpatient Visits           11 months ago Type 2 diabetes mellitus with hyperglycemia, with long-term current use of insulin ([place])   [place], [place], MD   1 year ago Type 2 diabetes mellitus with hyperglycemia, with long-term current use of insulin [facility])   [place], Annie Main L, RPH-CPP   1 year ago Type 2 diabetes mellitus with hyperglycemia, with long-term current use of insulin [facility])   [place], Annie Main L, RPH-CPP   1 year ago Type 2 diabetes mellitus with hyperglycemia, with long-term current use of insulin [facility])   [place], Jarome Matin, RPH-CPP   1 year ago Type 2 diabetes mellitus with hyperglycemia, with long-term current use of insulin ([place])   [place], [place], MD       Future Appointments             Today Charlott Rakes, MD [place]                Requested Prescriptions  Pending Prescriptions Disp Refills   LANTUS SOLOSTAR 100 UNIT/ML Solostar Pen [Pharmacy Med Name: Lantus SoloStar 100 UNIT/ML Subcutaneous Solution Pen-injector] 30 mL 0    Sig: Inject 45 Units into the skin daily.     Endocrinology:  Diabetes - Insulins Failed - 02/08/2021  9:09 AM      Failed - HBA1C is between 0 and 7.9 and within 180 days    Hemoglobin A1C  Date Value Ref Range Status  02/24/2020 7.5 (A) 4.0 - 5.6 % Final   HbA1c, POC (controlled diabetic range)  Date Value Ref Range Status  12/16/2019 10.2 (A) 0.0 - 7.0 % Final          Failed - Valid encounter within last 6 months    Recent Outpatient Visits           11 months ago Type 2 diabetes mellitus with hyperglycemia, with long-term current use of insulin ([place])   [place], [place], MD   1 year ago Type 2 diabetes mellitus with hyperglycemia, with long-term current use of insulin [facility])   [place], Annie Main L, RPH-CPP   1 year ago Type 2 diabetes mellitus with  hyperglycemia, with long-term current use of insulin [facility])   [place], RPH-CPP   1 year ago Type 2 diabetes mellitus with hyperglycemia, with long-term current use of insulin [facility])   [place], Jarome Matin, RPH-CPP   1 year ago Type 2 diabetes mellitus with hyperglycemia, with long-term current use of insulin [facility])   [place], [place], MD       Future Appointments             Today Charlott Rakes, MD [place]

## 2021-02-08 NOTE — Progress Notes (Signed)
Subjective:  Patient ID: Ray Garner, male    DOB: Aug 15, 1955  Age: 65 y.o. MRN: [id number]  CC: Diabetes   HPI Ray Garner is a 65 y.o. year old male with a history of  is a 65 year old male with history of type 2 diabetes mellitus (A1c 8.3), hypertension, hypothyroidism, Gout here for follow-up visit.  Interval History: He complains of the cost of Victoza- $200 for 3 pens Fasting sugars are around 135.  He has gained 22 pounds in the last 1 year and attributes this to eating a lot to prevent his sugar from dropping.  He is not happy about this.  He exercises by riding the bike about 2 times a week.  He has tendinitis in his R hand and is using a right wrist brace which has been helpful.  Compliant with his antihypertensive and is doing well on levothyroxine for his hypothyroidism.  He has had no recent gout flares. Denies additional concerns. Past Medical History:  Diagnosis Date   Anemia    Arthritis    Back pain, lumbosacral 06/30/2013   Blood transfusion without reported diagnosis    Cataract    BEGINNING   DM type 2 (diabetes mellitus, type 2) ([place]) 06/30/2013   Gout    HTN (hypertension) 06/30/2013   Hypertension    Hypothyroid 06/30/2013    Past Surgical History:  Procedure Laterality Date   CYST EXCISION     FRACTURE SURGERY      Family History  Problem Relation Age of Onset   Breast cancer Niece    Colon cancer Neg Hx    Colon polyps Neg Hx    Esophageal cancer Neg Hx    Rectal cancer Neg Hx    Stomach cancer Neg Hx     Allergies  Allergen Reactions   Vicodin [Hydrocodone-Acetaminophen] Hives and Rash    Outpatient Medications Prior to Visit  Medication Sig Dispense Refill   benzonatate (TESSALON) 100 MG capsule TAKE 1 CAPSULE BY MOUTH EVERY 8 (EIGHT) HOURS FOR COUGH. 21 capsule 0   Continuous Blood Gluc Receiver (FREESTYLE LIBRE 14 DAY READER) DEVI 1 each by Does not apply route 3 (three) times daily with meals. 1 Device 0   Continuous Blood Gluc Sensor  (FREESTYLE LIBRE 14 DAY SENSOR) MISC 1 each by Does not apply route 3 (three) times daily with meals. 1 each 11   fluticasone (FLONASE) 50 MCG/ACT nasal spray Place 2 sprays into both nostrils daily. 16 g 1   gabapentin (NEURONTIN) 300 MG capsule Take 1 capsule (300 mg total) by mouth at bedtime. 30 capsule 6   glucose blood (BAYER CONTOUR TEST) test strip Use as instructed 100 each 12   hydrocortisone 1 % ointment Apply 1 application topically 2 (two) times daily. 30 g 1   ibuprofen (ADVIL) 800 MG tablet Take 1 tablet (800 mg total) by mouth 3 (three) times daily. 21 tablet 0   insulin glargine (LANTUS SOLOSTAR) 100 UNIT/ML Solostar Pen Inject 45 Units into the skin daily. 30 mL 6   meclizine (ANTIVERT) 25 MG tablet Take 1 tablet (25 mg total) by mouth 2 (two) times daily as needed for dizziness. 30 tablet 0   metoCLOPramide (REGLAN) 5 MG tablet Take 1 tablet (5 mg total) by mouth 3 (three) times daily before meals. 90 tablet 1   polyethylene glycol (MIRALAX / GLYCOLAX) 17 g packet Take 17 g by mouth daily. 14 each 0   Spacer/Aero-Holding Chambers (AEROCHAMBER PLUS) inhaler Use as instructed 1 each  2   TRUEPLUS PEN NEEDLES 31G X 6 MM MISC USE AS DIRECTED AT BEDTIME 100 each 5   vitamin B-12 (CYANOCOBALAMIN) 1000 MCG tablet Take 1,000 mcg by mouth daily.     VICTOZA 18 MG/3ML SOPN INJECT 1.8  MG SUBCUTANEOUSLY ONCE DAILY WITH BREAKFAST 9 mL 0   atorvastatin (LIPITOR) 40 MG tablet TAKE 1 TABLET (40 MG TOTAL) BY MOUTH DAILY. 90 tablet 1   carvedilol (COREG) 6.25 MG tablet TAKE 1 TABLET (6.25 MG TOTAL) BY MOUTH 2 (TWO) TIMES DAILY WITH A MEAL. 60 tablet 6   cyclobenzaprine (FLEXERIL) 5 MG tablet Take 1 tablet (5 mg total) by mouth 3 (three) times daily as needed. (Patient not taking: Reported on 02/08/2021) 10 tablet 0   levothyroxine (SYNTHROID) 200 MCG tablet TAKE 1 TABLET BY MOUTH ONCE DAILY BEFORE BREAKFAST 90 tablet 1   metFORMIN (GLUCOPHAGE) 500 MG tablet TAKE 2 TABLETS BY MOUTH TWICE DAILY WITH  A MEAL 360 tablet 1   omeprazole (PRILOSEC) 20 MG capsule TAKE 1 CAPSULE (20 MG TOTAL) BY MOUTH DAILY. 90 capsule 1   allopurinol (ZYLOPRIM) 300 MG tablet Take 1 tablet (300 mg total) by mouth daily. 30 tablet 0   amLODipine (NORVASC) 10 MG tablet Take 1 tablet (10 mg total) by mouth daily. 30 tablet 0   losartan (COZAAR) 100 MG tablet Take 1 tablet (100 mg total) by mouth daily. 30 tablet 0   No facility-administered medications prior to visit.     ROS Review of Systems General: negative for fever, weight loss, appetite change Eyes: no visual symptoms. ENT: no ear symptoms, no sinus tenderness, no nasal congestion or sore throat. Neck: no pain  Respiratory: no wheezing, shortness of breath, cough Cardiovascular: no chest pain, no dyspnea on exertion, no pedal edema, no orthopnea. Gastrointestinal: no abdominal pain, no diarrhea, no constipation Genito-Urinary: no urinary frequency, no dysuria, no polyuria. Hematologic: no bruising Endocrine: no cold or heat intolerance Neurological: no headaches, no seizures, no tremors Musculoskeletal: see HPI Skin: no pruritus, no rash. Psychological: no depression, no anxiety,   Objective:  BP (!) 147/83    Pulse 88    Ht 6' (1.829 m)    Wt 283 lb 6.4 oz (128.5 kg)    BMI 38.44 kg/m   BP/Weight 02/08/2021 09/11/2020 36/62/9476  Systolic BP 546 503 546  Diastolic BP 83 568 79  Wt. (Lbs) 283.4 - 261  BMI 38.44 - 35.4      Physical Exam Constitutional: normal appearing,  Eyes: PERRLA HEENT: Head is atraumatic, normal sinuses, normal oropharynx, normal appearing tonsils and palate, tympanic membrane is normal bilaterally. Neck: normal range of motion, no thyromegaly, no JVD Cardiovascular: normal rate and rhythm, normal heart sounds, no murmurs, rub or gallop, no pedal edema Respiratory: Normal breath sounds, clear to auscultation bilaterally, no wheezes, no rales, no rhonchi Abdomen: soft, not tender to palpation, normal bowel sounds,  no enlarged organs Musculoskeletal: Full ROM, no tenderness in joints, right wrist in a brace Skin: warm and dry, no lesions. Neurological: alert, oriented x3, cranial nerves I-XII grossly intact , normal motor strength, normal sensation. Psychological: normal mood.  CMP Latest Ref Rng & Units 09/11/2020 12/16/2019 09/15/2019  Glucose 70 - 99 mg/dL 175(H) 111(H) 219(H)  BUN 8 - 23 mg/dL 10 7(L) 8  Creatinine 0.61 - 1.24 mg/dL 0.90 0.79 0.97  Sodium 135 - 145 mmol/L 136 142 141  Potassium 3.5 - 5.1 mmol/L 4.6 3.9 4.1  Chloride 98 - 111 mmol/L 102 104 102  CO2 22 - 32 mmol/L 28 23 25   Calcium 8.9 - 10.3 mg/dL 9.4 9.3 8.8  Total Protein 6.5 - 8.1 g/dL 8.1 7.4 7.1  Total Bilirubin 0.3 - 1.2 mg/dL 0.7 0.5 0.4  Alkaline Phos 38 - 126 U/L 76 102 84  AST 15 - 41 U/L 21 19 17   ALT 0 - 44 U/L 21 16 11     Lipid Panel     Component Value Date/Time   CHOL 110 12/16/2019 1140   TRIG 92 12/16/2019 1140   HDL 32 (L) 12/16/2019 1140   CHOLHDL 3.4 12/16/2019 1140   CHOLHDL 3.6 08/08/2015 0845   VLDL 34 (H) 08/08/2015 0845   LDLCALC 60 12/16/2019 1140    CBC    Component Value Date/Time   WBC 8.5 09/11/2020 1343   RBC 5.39 09/11/2020 1343   HGB 15.3 09/11/2020 1343   HGB 13.2 08/28/2018 1507   HCT 47.3 09/11/2020 1343   HCT 39.0 08/28/2018 1507   PLT 241 09/11/2020 1343   PLT 274 08/28/2018 1507   MCV 87.8 09/11/2020 1343   MCV 84 08/28/2018 1507   MCH 28.4 09/11/2020 1343   MCHC 32.3 09/11/2020 1343   RDW 13.2 09/11/2020 1343   RDW 13.4 08/28/2018 1507   LYMPHSABS 3.6 (H) 08/28/2018 1507   MONOABS 0.5 12/06/2010 1638   EOSABS 0.2 08/28/2018 1507   BASOSABS 0.0 08/28/2018 1507    Lab Results  Component Value Date   HGBA1C 8.3 (A) 02/08/2021    Lab Results  Component Value Date   TSH 2.850 12/16/2019    Assessment & Plan:  1. Type 2 diabetes mellitus with hyperglycemia, with long-term current use of insulin (HCC) Uncontrolled with A1c of 8.3; goal is less than  7.0 Switched from Victoza to Trulicity due to the availability of coupon with the latter.  Clinical pharmacist has met with patient to provide co-pay card. I will see him at his next visit to determine if titration of Trulicity is indicated Continue metformin and Lantus Counseled on Diabetic diet, my plate method, 817 minutes of moderate intensity exercise/week Blood sugar logs with fasting goals of 80-120 mg/dl, random of less than 180 and in the event of sugars less than 60 mg/dl or greater than 400 mg/dl encouraged to notify the clinic. Advised on the need for annual eye exams, annual foot exams, Pneumonia vaccine. - POCT glucose (manual entry) - POCT glycosylated hemoglobin (Hb A1C) - Dulaglutide (TRULICITY) 1.5 RN/1.6FB SOPN; Inject 1.5 mg into the skin once a week. For 1 month then increase to 3.43m  once a week thereafter.  Dispense: 2 mL; Refill: 3 - LP+Non-HDL Cholesterol; Future - Microalbumin / creatinine urine ratio; Future  2. Hypertension associated with diabetes (HVinings Slightly above goal No regimen change but advised to work on lifestyle modifications Will reassess at next visit [place] blood pressure goal of less than 130/80, low-sodium, DASH diet, medication compliance, 150 minutes of moderate intensity exercise per week. Discussed medication compliance, adverse effects. - amLODipine (NORVASC) 10 MG tablet; Take 1 tablet (10 mg total) by mouth daily.  Dispense: 30 tablet; Refill: 3 - losartan (COZAAR) 100 MG tablet; Take 1 tablet (100 mg total) by mouth daily.  Dispense: 30 tablet; Refill: 6  3. Chronic gout without tophus, unspecified cause, unspecified site Stable with no acute flares - allopurinol (ZYLOPRIM) 300 MG tablet; Take 1 tablet (300 mg total) by mouth daily.  Dispense: 30 tablet; Refill: 3  4. Hypothyroidism, unspecified type Last TSH was normal  Will repeat thyroid panel and adjust regimen accordingly - T4, free; Future - TSH; Future    Meds ordered  this encounter  Medications   Dulaglutide (TRULICITY) 1.5 FH/8.3EX SOPN    Sig: Inject 1.5 mg into the skin once a week. For 1 month then increase to 3.47m  once a week thereafter.    Dispense:  2 mL    Refill:  3    Discontinue Victoza   amLODipine (NORVASC) 10 MG tablet    Sig: Take 1 tablet (10 mg total) by mouth daily.    Dispense:  30 tablet    Refill:  3   allopurinol (ZYLOPRIM) 300 MG tablet    Sig: Take 1 tablet (300 mg total) by mouth daily.    Dispense:  30 tablet    Refill:  3   losartan (COZAAR) 100 MG tablet    Sig: Take 1 tablet (100 mg total) by mouth daily.    Dispense:  30 tablet    Refill:  6     Follow-up: Return in about 3 months (around 05/09/2021) for Chronic medical conditions.       ECharlott Rakes MD, FAAFP. [facility]  02/08/2021, 6:09 PM

## 2021-02-09 ENCOUNTER — Other Ambulatory Visit: Payer: Self-pay | Admitting: Family Medicine

## 2021-02-09 ENCOUNTER — Other Ambulatory Visit: Payer: Self-pay

## 2021-02-09 DIAGNOSIS — E1159 Type 2 diabetes mellitus with other circulatory complications: Secondary | ICD-10-CM

## 2021-02-09 DIAGNOSIS — M1A9XX0 Chronic gout, unspecified, without tophus (tophi): Secondary | ICD-10-CM

## 2021-02-09 DIAGNOSIS — E1165 Type 2 diabetes mellitus with hyperglycemia: Secondary | ICD-10-CM

## 2021-02-09 NOTE — Telephone Encounter (Signed)
Copied from [place] [id number]. Topic: Quick Communication - Rx Refill/Question >> Feb 09, 2021 11:33 AM Parke Poisson wrote: Medication: Dulaglutide (TRULICITY) 1.5 ZF/5.8IP SOPN  2)losartan (COZAAR) 100 MG tablet [ 3)allopurinol (ZYLOPRIM) 300 MG tablet  4)amLODipine (NORVASC) 10 MG  Has the patient contacted their pharmacy? Yes He changed his mind about getting meds at [facility] (with phone number or street name): [address], [place] [address]  Phone:  [phone] Fax:  [phone]  DEA #:  [id number] Has the patient been seen for an appointment in the last year OR does the patient have an upcoming appointment? yes  Agent: Please be advised that RX refills may take up to 3 business days. We ask that you follow-up with your pharmacy.

## 2021-02-10 ENCOUNTER — Other Ambulatory Visit: Payer: Self-pay

## 2021-02-10 MED ORDER — AMLODIPINE BESYLATE 10 MG PO TABS
10.0000 mg | ORAL_TABLET | Freq: Every day | ORAL | 3 refills | Status: DC
  Filled 2021-02-10: qty 30, 30d supply, fill #0

## 2021-02-10 MED ORDER — LOSARTAN POTASSIUM 100 MG PO TABS
100.0000 mg | ORAL_TABLET | Freq: Every day | ORAL | 6 refills | Status: DC
  Filled 2021-02-10: qty 30, 30d supply, fill #0

## 2021-02-10 MED ORDER — TRULICITY 1.5 MG/0.5ML ~~LOC~~ SOAJ
1.5000 mg | SUBCUTANEOUS | 3 refills | Status: DC
  Filled 2021-02-10 – 2021-02-21 (×2): qty 2, 28d supply, fill #0

## 2021-02-10 MED ORDER — ALLOPURINOL 300 MG PO TABS
300.0000 mg | ORAL_TABLET | Freq: Every day | ORAL | 3 refills | Status: DC
  Filled 2021-02-10: qty 30, 30d supply, fill #0

## 2021-02-10 NOTE — Telephone Encounter (Signed)
Wants these filled at [facility].  Requested Prescriptions  Pending Prescriptions Disp Refills   Dulaglutide (TRULICITY) 1.5 OB/0.9GG SOPN 2 mL 3    Sig: Inject 1.5 mg into the skin once a week. For 1 month then increase to 3.0mg   once a week thereafter.     Endocrinology:  Diabetes - GLP-1 Receptor Agonists Failed - 02/10/2021  1:30 PM      Failed - HBA1C is between 0 and 7.9 and within 180 days    HbA1c, POC (controlled diabetic range)  Date Value Ref Range Status  02/08/2021 8.3 (A) 0.0 - 7.0 % Final         Passed - Valid encounter within last 6 months    Recent Outpatient Visits          2 days ago Type 2 diabetes mellitus with hyperglycemia, with long-term current use of insulin ([place])   [place], Charlane Ferretti, MD   11 months ago Type 2 diabetes mellitus with hyperglycemia, with long-term current use of insulin ([place])   [place], Charlane Ferretti, MD   1 year ago Type 2 diabetes mellitus with hyperglycemia, with long-term current use of insulin [facility])   [place], Jarome Matin, RPH-CPP   1 year ago Type 2 diabetes mellitus with hyperglycemia, with long-term current use of insulin [facility])   [place], Jarome Matin, RPH-CPP   1 year ago Type 2 diabetes mellitus with hyperglycemia, with long-term current use of insulin [facility])   [place] Ausdall, Stephen L, RPH-CPP              losartan (COZAAR) 100 MG tablet 30 tablet 6    Sig: Take 1 tablet (100 mg total) by mouth daily.     Cardiovascular:  Angiotensin Receptor Blockers Failed - 02/10/2021  1:30 PM      Failed - Last BP in normal range    BP Readings from Last 1 Encounters:  02/08/21 (!) 147/83         Passed - Cr in normal range and within 180 days    Creat  Date Value Ref Range Status  04/11/2016 0.89 0.70 -  1.33 mg/dL Final    Comment:      For patients > or = 64 years of age: The upper reference limit for Creatinine is approximately 13% higher for people identified as African-American.      Creatinine, Ser  Date Value Ref Range Status  09/11/2020 0.90 0.61 - 1.24 mg/dL Final   Creatinine,U  Date Value Ref Range Status  07/08/2017 324.4 mg/dL Final   Creatinine, Urine  Date Value Ref Range Status  08/08/2015 77 20 - 370 mg/dL Final         Passed - K in normal range and within 180 days    Potassium  Date Value Ref Range Status  09/11/2020 4.6 3.5 - 5.1 mmol/L Final         Passed - Patient is not pregnant      Passed - Valid encounter within last 6 months    Recent Outpatient Visits          2 days ago Type 2 diabetes mellitus with hyperglycemia, with long-term current use of insulin (HCC)   [place], Charlane Ferretti, MD   11 months ago Type 2 diabetes mellitus  with hyperglycemia, with long-term current use of insulin ([place])   [place], Charlane Ferretti, MD   1 year ago Type 2 diabetes mellitus with hyperglycemia, with long-term current use of insulin [facility])   [place], Jarome Matin, RPH-CPP   1 year ago Type 2 diabetes mellitus with hyperglycemia, with long-term current use of insulin [facility])   [place], Jarome Matin, RPH-CPP   1 year ago Type 2 diabetes mellitus with hyperglycemia, with long-term current use of insulin [facility])   [place] Ausdall, Annie Main L, RPH-CPP              allopurinol (ZYLOPRIM) 300 MG tablet 30 tablet 3    Sig: Take 1 tablet (300 mg total) by mouth daily.     Endocrinology:  Gout Agents Failed - 02/10/2021  1:30 PM      Failed - Uric Acid in normal range and within 360 days    Uric Acid, Serum  Date Value Ref Range Status  07/08/2017 5.0 4.0 - 7.8 mg/dL Final          Passed - Cr in normal range and within 360 days    Creat  Date Value Ref Range Status  04/11/2016 0.89 0.70 - 1.33 mg/dL Final    Comment:      For patients > or = 64 years of age: The upper reference limit for Creatinine is approximately 13% higher for people identified as African-American.      Creatinine, Ser  Date Value Ref Range Status  09/11/2020 0.90 0.61 - 1.24 mg/dL Final   Creatinine,U  Date Value Ref Range Status  07/08/2017 324.4 mg/dL Final   Creatinine, Urine  Date Value Ref Range Status  08/08/2015 77 20 - 370 mg/dL Final         Passed - Valid encounter within last 12 months    Recent Outpatient Visits          2 days ago Type 2 diabetes mellitus with hyperglycemia, with long-term current use of insulin ([place])   [place], Charlane Ferretti, MD   11 months ago Type 2 diabetes mellitus with hyperglycemia, with long-term current use of insulin ([place])   [place], Charlane Ferretti, MD   1 year ago Type 2 diabetes mellitus with hyperglycemia, with long-term current use of insulin [facility])   [place], Jarome Matin, RPH-CPP   1 year ago Type 2 diabetes mellitus with hyperglycemia, with long-term current use of insulin [facility])   [place], Jarome Matin, RPH-CPP   1 year ago Type 2 diabetes mellitus with hyperglycemia, with long-term current use of insulin [facility])   [place] Ausdall, Stephen L, RPH-CPP              amLODipine (NORVASC) 10 MG tablet 30 tablet 3    Sig: Take 1 tablet (10 mg total) by mouth daily.     Cardiovascular:  Calcium Channel Blockers Failed - 02/10/2021  1:30 PM      Failed - Last BP in normal range    BP Readings from Last 1 Encounters:  02/08/21 (!) 147/83         Passed - Valid encounter within last 6 months    Recent Outpatient Visits  2 days  ago Type 2 diabetes mellitus with hyperglycemia, with long-term current use of insulin ([place])   [place], [place], MD   11 months ago Type 2 diabetes mellitus with hyperglycemia, with long-term current use of insulin ([place])   [place], Charlane Ferretti, MD   1 year ago Type 2 diabetes mellitus with hyperglycemia, with long-term current use of insulin [facility])   [place], Jarome Matin, RPH-CPP   1 year ago Type 2 diabetes mellitus with hyperglycemia, with long-term current use of insulin [facility])   [place], Jarome Matin, RPH-CPP   1 year ago Type 2 diabetes mellitus with hyperglycemia, with long-term current use of insulin [facility])   [place], RPH-CPP

## 2021-02-21 ENCOUNTER — Other Ambulatory Visit: Payer: Self-pay

## 2021-04-17 ENCOUNTER — Ambulatory Visit: Payer: Self-pay | Admitting: *Deleted

## 2021-04-17 ENCOUNTER — Other Ambulatory Visit: Payer: Self-pay

## 2021-04-17 DIAGNOSIS — E1165 Type 2 diabetes mellitus with hyperglycemia: Secondary | ICD-10-CM

## 2021-04-17 DIAGNOSIS — Z794 Long term (current) use of insulin: Secondary | ICD-10-CM

## 2021-04-17 MED ORDER — BASAGLAR KWIKPEN 100 UNIT/ML ~~LOC~~ SOPN
45.0000 [IU] | PEN_INJECTOR | Freq: Every day | SUBCUTANEOUS | 3 refills | Status: DC
  Filled 2021-04-17: qty 12, 26d supply, fill #0

## 2021-04-17 MED ORDER — TRULICITY 3 MG/0.5ML ~~LOC~~ SOAJ
3.0000 mg | SUBCUTANEOUS | 3 refills | Status: DC
  Filled 2021-04-17: qty 2, 28d supply, fill #0
  Filled 2021-05-11: qty 2, 28d supply, fill #1
  Filled 2021-06-08: qty 2, 28d supply, fill #2
  Filled 2021-07-25: qty 2, 28d supply, fill #3

## 2021-04-17 NOTE — Telephone Encounter (Signed)
Lantus needs to be changed to [employer] for insurance, and he is needing trulicity sent to [facility] pharmacy.

## 2021-04-17 NOTE — Telephone Encounter (Signed)
Per agent: "Pt called  in and is out of these [employer] cover the victoza or LANTUS SOLOSTAR 100 UNIT/ML  for him , but they will cover the basaglar, so he wants to know can this and Trulicity be sent over to [place] at [facility]  Phone: [phone]  Fax: [phone] "  Pt states he would like to have both send to [place] from now on. Please advise: [phone] Reason for Disposition  [1] Caller has URGENT medicine question about med that PCP or specialist prescribed AND [2] triager unable to answer question  Protocols used: Medication Question Call-A-AH

## 2021-04-17 NOTE — Telephone Encounter (Signed)
Done

## 2021-04-18 ENCOUNTER — Other Ambulatory Visit: Payer: Self-pay

## 2021-04-18 NOTE — Telephone Encounter (Signed)
Pt was called and informed of medication being sent to [facility] pharmacy.

## 2021-05-11 ENCOUNTER — Other Ambulatory Visit: Payer: Self-pay

## 2021-05-12 ENCOUNTER — Other Ambulatory Visit: Payer: Self-pay

## 2021-05-24 ENCOUNTER — Other Ambulatory Visit: Payer: Self-pay

## 2021-05-24 DIAGNOSIS — M1A9XX0 Chronic gout, unspecified, without tophus (tophi): Secondary | ICD-10-CM

## 2021-06-08 ENCOUNTER — Other Ambulatory Visit: Payer: Self-pay

## 2021-07-25 ENCOUNTER — Other Ambulatory Visit: Payer: Self-pay

## 2021-08-07 ENCOUNTER — Other Ambulatory Visit: Payer: Self-pay

## 2021-09-01 ENCOUNTER — Other Ambulatory Visit: Payer: Self-pay

## 2021-09-04 ENCOUNTER — Other Ambulatory Visit: Payer: Self-pay

## 2021-09-04 ENCOUNTER — Other Ambulatory Visit: Payer: Self-pay | Admitting: Family Medicine

## 2021-09-04 DIAGNOSIS — E039 Hypothyroidism, unspecified: Secondary | ICD-10-CM

## 2021-09-05 ENCOUNTER — Other Ambulatory Visit: Payer: Self-pay

## 2021-09-05 ENCOUNTER — Other Ambulatory Visit: Payer: Self-pay | Admitting: Family Medicine

## 2021-09-05 DIAGNOSIS — E039 Hypothyroidism, unspecified: Secondary | ICD-10-CM

## 2021-09-05 MED ORDER — TRULICITY 3 MG/0.5ML ~~LOC~~ SOAJ
3.0000 mg | SUBCUTANEOUS | 0 refills | Status: DC
  Filled 2021-09-05: qty 2, 28d supply, fill #0

## 2021-09-05 NOTE — Telephone Encounter (Signed)
Patient called, left VM to return the call to the office to schedule an OV for follow up. Noted last OV to return in 3 months. No visit scheduled.  Follow-up: Return in about 3 months (around 05/09/2021) for Chronic medical conditions.

## 2021-09-05 NOTE — Telephone Encounter (Signed)
Medication Refill - Medication: Dulaglutide (TRULICITY) 3 WX/0.3PN SOPN, levothyroxine (SYNTHROID) 200 MCG tablet  Has the patient contacted their pharmacy? Yes.   (Agent: If no, request that the patient contact the pharmacy for the refill. If patient does not wish to contact the pharmacy document the reason why and proceed with request.) (Agent: If yes, when and what did the pharmacy advise?)  Preferred Pharmacy (with phone number or street name):  [place] [facility] Phone:  [phone]  Fax:  [phone]     Has the patient been seen for an appointment in the last year OR does the patient have an upcoming appointment? Yes.    Agent: Please be advised that RX refills may take up to 3 business days. We ask that you follow-up with your pharmacy.

## 2021-09-05 NOTE — Telephone Encounter (Signed)
Requested medication (s) are due for refill today: Yes  Requested medication (s) are on the active medication list: Yes  Last refill:  11/2019  Future visit scheduled: No  Notes to clinic:  Unable to refill per protocol due to failed labs, no updated results, office visit needed.      Requested Prescriptions  Pending Prescriptions Disp Refills   levothyroxine (SYNTHROID) 200 MCG tablet 90 tablet 1    Sig: TAKE 1 TABLET BY MOUTH ONCE DAILY BEFORE BREAKFAST     Endocrinology:  Hypothyroid Agents Failed - 09/04/2021  9:52 AM      Failed - TSH in normal range and within 360 days    TSH  Date Value Ref Range Status  12/16/2019 2.850 0.450 - 4.500 uIU/mL Final         Passed - Valid encounter within last 12 months    Recent Outpatient Visits           6 months ago Type 2 diabetes mellitus with hyperglycemia, with long-term current use of insulin ([place])   [place], [place], MD   1 year ago Type 2 diabetes mellitus with hyperglycemia, with long-term current use of insulin ([place])   [place], [place], MD   1 year ago Type 2 diabetes mellitus with hyperglycemia, with long-term current use of insulin [facility])   [place], Annie Main L, RPH-CPP   1 year ago Type 2 diabetes mellitus with hyperglycemia, with long-term current use of insulin [facility])   [place], Annie Main L, RPH-CPP   1 year ago Type 2 diabetes mellitus with hyperglycemia, with long-term current use of insulin [facility])   [place] Ausdall, Jarome Matin, RPH-CPP              Signed Prescriptions Disp Refills   Dulaglutide (TRULICITY) 3 ZW/2.5EN SOPN 2 mL 0    Sig: Inject 3 mg as directed once a week. OFFICE VISIT NEEDED FOR ADDITIONAL REFILLS     Endocrinology:  Diabetes - GLP-1 Receptor Agonists Failed - 09/04/2021  9:52 AM       Failed - HBA1C is between 0 and 7.9 and within 180 days    HbA1c, POC (controlled diabetic range)  Date Value Ref Range Status  02/08/2021 8.3 (A) 0.0 - 7.0 % Final         Failed - Valid encounter within last 6 months    Recent Outpatient Visits           6 months ago Type 2 diabetes mellitus with hyperglycemia, with long-term current use of insulin ([place])   [place], [place], MD   1 year ago Type 2 diabetes mellitus with hyperglycemia, with long-term current use of insulin ([place])   [place], [place], MD   1 year ago Type 2 diabetes mellitus with hyperglycemia, with long-term current use of insulin [facility])   [place], Annie Main L, RPH-CPP   1 year ago Type 2 diabetes mellitus with hyperglycemia, with long-term current use of insulin [facility])   [place], Jarome Matin, RPH-CPP   1 year ago Type 2 diabetes mellitus with hyperglycemia, with long-term current use of insulin [facility])   [place], RPH-CPP

## 2021-09-06 ENCOUNTER — Other Ambulatory Visit: Payer: Self-pay

## 2021-09-06 MED ORDER — LEVOTHYROXINE SODIUM 200 MCG PO TABS
ORAL_TABLET | Freq: Every day | ORAL | 2 refills | Status: DC
  Filled 2021-09-06: qty 30, 30d supply, fill #0

## 2021-09-06 MED ORDER — TRULICITY 3 MG/0.5ML ~~LOC~~ SOAJ
3.0000 mg | SUBCUTANEOUS | 2 refills | Status: DC
  Filled 2021-09-06 – 2021-09-28 (×2): qty 2, 28d supply, fill #0
  Filled 2021-10-31: qty 2, 28d supply, fill #1

## 2021-09-06 NOTE — Telephone Encounter (Signed)
Requested medication (s) are due for refill today: Trulicity refilled 1/94/17.  Requested medication (s) are on the active medication list: Yes  Last refill:  Synthroid - 12/17/19  Future visit scheduled: Yes  Notes to clinic:  Synthroid has expired.    Requested Prescriptions  Pending Prescriptions Disp Refills   Dulaglutide (TRULICITY) 3 EY/8.1KG SOPN 2 mL 0    Sig: Inject 3 mg as directed once a week. OFFICE VISIT NEEDED FOR ADDITIONAL REFILLS     Endocrinology:  Diabetes - GLP-1 Receptor Agonists Failed - 09/05/2021  1:06 PM      Failed - HBA1C is between 0 and 7.9 and within 180 days    HbA1c, POC (controlled diabetic range)  Date Value Ref Range Status  02/08/2021 8.3 (A) 0.0 - 7.0 % Final         Failed - Valid encounter within last 6 months    Recent Outpatient Visits           7 months ago Type 2 diabetes mellitus with hyperglycemia, with long-term current use of insulin ([place])   [place], Gra[place], MD   1 year ago Type 2 diabetes mellitus with hyperglycemia, with long-term current use of insulin ([place])   [place], Wasta, MD   1 year ago Type 2 diabetes mellitus with hyperglycemia, with long-term current use of insulin [facility])   [place], Annie Main L, RPH-CPP   1 year ago Type 2 diabetes mellitus with hyperglycemia, with long-term current use of insulin [facility])   [place], Annie Main L, RPH-CPP   1 year ago Type 2 diabetes mellitus with hyperglycemia, with long-term current use of insulin [facility])   [place], Jarome Matin, RPH-CPP       Future Appointments             In 3 months Charlott Rakes, MD [place]             levothyroxine (SYNTHROID) 200 MCG tablet 90 tablet 1    Sig: TAKE 1 TABLET BY MOUTH ONCE DAILY BEFORE  BREAKFAST     Endocrinology:  Hypothyroid Agents Failed - 09/05/2021  1:06 PM      Failed - TSH in normal range and within 360 days    TSH  Date Value Ref Range Status  12/16/2019 2.850 0.450 - 4.500 uIU/mL Final         Passed - Valid encounter within last 12 months    Recent Outpatient Visits           7 months ago Type 2 diabetes mellitus with hyperglycemia, with long-term current use of insulin ([place])   [place], [place], MD   1 year ago Type 2 diabetes mellitus with hyperglycemia, with long-term current use of insulin ([place])   [place], [place], MD   1 year ago Type 2 diabetes mellitus with hyperglycemia, with long-term current use of insulin [facility])   [place], Annie Main L, RPH-CPP   1 year ago Type 2 diabetes mellitus with hyperglycemia, with long-term current use of insulin [facility])   [place], Annie Main L, RPH-CPP   1 year ago Type 2 diabetes mellitus with hyperglycemia, with long-term current use of insulin ([place])  [place], RPH-CPP       Future Appointments             In 3 months Charlott Rakes, MD [place]

## 2021-09-08 ENCOUNTER — Other Ambulatory Visit: Payer: Self-pay

## 2021-09-14 ENCOUNTER — Emergency Department (HOSPITAL_COMMUNITY)
Admission: EM | Admit: 2021-09-14 | Discharge: 2021-09-15 | Disposition: A | Discharge status: Home / Self Care | Payer: Commercial Managed Care - HMO | Attending: Emergency Medicine | Admitting: Emergency Medicine

## 2021-09-14 ENCOUNTER — Other Ambulatory Visit: Payer: Self-pay

## 2021-09-14 DIAGNOSIS — E039 Hypothyroidism, unspecified: Secondary | ICD-10-CM

## 2021-09-14 DIAGNOSIS — Z79899 Other long term (current) drug therapy: Secondary | ICD-10-CM | POA: Insufficient documentation

## 2021-09-14 DIAGNOSIS — E860 Dehydration: Secondary | ICD-10-CM

## 2021-09-14 DIAGNOSIS — Z20822 Contact with and (suspected) exposure to covid-19: Secondary | ICD-10-CM | POA: Insufficient documentation

## 2021-09-14 DIAGNOSIS — J020 Streptococcal pharyngitis: Secondary | ICD-10-CM | POA: Diagnosis not present

## 2021-09-14 DIAGNOSIS — J029 Acute pharyngitis, unspecified: Secondary | ICD-10-CM | POA: Diagnosis present

## 2021-09-14 LAB — BASIC METABOLIC PANEL
Anion gap: 9 (ref 5–15)
BUN: 34 mg/dL — ABNORMAL HIGH (ref 8–23)
CO2: 24 mmol/L (ref 22–32)
Calcium: 9.4 mg/dL (ref 8.9–10.3)
Chloride: 103 mmol/L (ref 98–111)
Creatinine, Ser: 1.64 mg/dL — ABNORMAL HIGH (ref 0.61–1.24)
GFR, Estimated: 46 mL/min — ABNORMAL LOW (ref >60)
Glucose, Bld: 157 mg/dL — ABNORMAL HIGH (ref 70–99)
Potassium: 4.1 mmol/L (ref 3.5–5.1)
Sodium: 136 mmol/L (ref 135–145)

## 2021-09-14 LAB — RESP PANEL BY RT-PCR (FLU A&B, COVID) ARPGX2
Influenza A by PCR: NEGATIVE
Influenza B by PCR: NEGATIVE
SARS Coronavirus 2 by RT PCR: NEGATIVE

## 2021-09-14 LAB — CBC WITH DIFFERENTIAL/PLATELET
Abs Immature Granulocytes: 0.08 10*3/uL — ABNORMAL HIGH (ref 0.00–0.07)
Basophils Absolute: 0.1 10*3/uL (ref 0.0–0.1)
Basophils Relative: 0 %
Eosinophils Absolute: 0.2 10*3/uL (ref 0.0–0.5)
Eosinophils Relative: 1 %
HCT: 43.9 % (ref 39.0–52.0)
Hemoglobin: 14.3 g/dL (ref 13.0–17.0)
Immature Granulocytes: 1 %
Lymphocytes Relative: 25 %
Lymphs Abs: 3.5 10*3/uL (ref 0.7–4.0)
MCH: 27.4 pg (ref 26.0–34.0)
MCHC: 32.6 g/dL (ref 30.0–36.0)
MCV: 84.3 fL (ref 80.0–100.0)
Monocytes Absolute: 1 10*3/uL (ref 0.1–1.0)
Monocytes Relative: 7 %
Neutro Abs: 9.3 10*3/uL — ABNORMAL HIGH (ref 1.7–7.7)
Neutrophils Relative %: 66 %
Platelets: 351 10*3/uL (ref 150–400)
RBC: 5.21 MIL/uL (ref 4.22–5.81)
RDW: 13.6 % (ref 11.5–15.5)
WBC: 14.1 10*3/uL — ABNORMAL HIGH (ref 4.0–10.5)
nRBC: 0 % (ref 0.0–0.2)

## 2021-09-14 LAB — URINALYSIS, ROUTINE W REFLEX MICROSCOPIC
Bilirubin Urine: NEGATIVE
Glucose, UA: NEGATIVE mg/dL
Hgb urine dipstick: NEGATIVE
Ketones, ur: NEGATIVE mg/dL
Nitrite: NEGATIVE
Protein, ur: 30 mg/dL — AB
Specific Gravity, Urine: 1.017 (ref 1.005–1.030)
pH: 5 (ref 5.0–8.0)

## 2021-09-14 LAB — CBG MONITORING, ED: Glucose-Capillary: 172 mg/dL — ABNORMAL HIGH (ref 70–99)

## 2021-09-14 LAB — MAGNESIUM: Magnesium: 2.5 mg/dL — ABNORMAL HIGH (ref 1.7–2.4)

## 2021-09-14 LAB — GROUP A STREP BY PCR: Group A Strep by PCR: DETECTED — AB

## 2021-09-14 NOTE — ED Triage Notes (Signed)
Pt here for URI symptoms - sore throat, cough, body aches, L ear ache, N/V/D x a few days. Pt reports being unable to keep down anything PO, states he checked his blood sugar yesterday morning and it was too low for his machine to pick up, but this morning it was 160 after drinking orange juice and eating a peach.

## 2021-09-14 NOTE — ED Provider Triage Note (Signed)
Emergency Medicine Provider Triage Evaluation Note  Ray Garner , a 66 y.o. male  was evaluated in triage.  Pt complains of URI symptoms including sore throat, earache of several day duration.  He also reports episode of hypoglycemia yesterday.  States during this episode his glucose meter read low and did not give him a number.  He states he felt lightheaded, and weak.  Denies chest pain, shortness of breath.  States he drank orange juice and as long as he is sitting he feels okay.  Blood sugar in triage 172.  Patient is well-appearing.  Review of Systems  Positive: As above Negative: As above  Physical Exam  BP (!) 147/104   Pulse 84   Temp 98.3 F (36.8 C) (Oral)   Resp 16   SpO2 97%  Gen:   Awake, no distress   Resp:  Normal effort  MSK:   Moves extremities without difficulty  Other:    Medical Decision Making  Medically screening exam initiated at 5:55 PM.  Appropriate orders placed.  Ray Garner was informed that the remainder of the evaluation will be completed by another provider, this initial triage assessment does not replace that evaluation, and the importance of remaining in the ED until their evaluation is complete.    Evlyn Courier, PA-C 09/14/21 1757

## 2021-09-15 ENCOUNTER — Other Ambulatory Visit: Payer: Self-pay

## 2021-09-15 LAB — CBG MONITORING, ED: Glucose-Capillary: 230 mg/dL — ABNORMAL HIGH (ref 70–99)

## 2021-09-15 MED ORDER — LEVOTHYROXINE SODIUM 200 MCG PO TABS
ORAL_TABLET | Freq: Every day | ORAL | 2 refills | Status: DC
  Filled 2021-09-15 (×2): qty 30, 30d supply, fill #0

## 2021-09-15 MED ORDER — AMOXICILLIN 500 MG PO CAPS
500.0000 mg | ORAL_CAPSULE | Freq: Three times a day (TID) | ORAL | 0 refills | Status: DC
  Filled 2021-09-15: qty 30, 10d supply, fill #0

## 2021-09-15 MED ORDER — AMOXICILLIN 500 MG PO CAPS
500.0000 mg | ORAL_CAPSULE | Freq: Once | ORAL | Status: AC
  Administered 2021-09-15: 500 mg via ORAL
  Filled 2021-09-15: qty 1

## 2021-09-15 NOTE — ED Provider Notes (Signed)
[facility] Emergency Department Provider Note MRN:  [id number]  Arrival date & time: 09/15/21     Chief Complaint   URI   History of Present Illness   Ray Garner is a 66 y.o. year-old male presents to the ED with chief complaint of sore throat and earache.  He also states that he has not been drinking as much because of his sore throat.  He denies fever.  States that his blood sugar was low yesterday, but has been high today.  History provided by patient.   Review of Systems  Pertinent review of systems noted in HPI.    Physical Exam   Vitals:   09/14/21 2109 09/15/21 0249  BP: (!) 151/88 (!) 149/99  Pulse: 77 68  Resp: 17 14  Temp: 98.7 F (37.1 C) 98.6 F (37 C)  SpO2: 92% 97%    CONSTITUTIONAL:  nontoxic-appearing, NAD NEURO:  Alert and oriented x 3, CN 3-12 grossly intact EYES:  eyes equal and reactive ENT/NECK:  Supple, no stridor, oropharynx is erythematous, no tonsillar exudates CARDIO:  normal rate, regular rhythm, appears well-perfused  PULM:  No respiratory distress GI/GU:  non-distended,  MSK/SPINE:  No gross deformities, no edema, moves all extremities  SKIN:  no rash, atraumatic   *Additional and/or pertinent findings included in MDM below  Diagnostic and Interventional Summary    EKG Interpretation  Date/Time:    Ventricular Rate:    PR Interval:    QRS Duration:   QT Interval:    QTC Calculation:   R Axis:     Text Interpretation:         Labs Reviewed  GROUP A STREP BY PCR - Abnormal; Notable for the following components:      Result Value   Group A Strep by PCR DETECTED (*)    All other components within normal limits  CBC WITH DIFFERENTIAL/PLATELET - Abnormal; Notable for the following components:   WBC 14.1 (*)    Neutro Abs 9.3 (*)    Abs Immature Granulocytes 0.08 (*)    All other components within normal limits  BASIC METABOLIC PANEL - Abnormal; Notable for the following components:   Glucose, Bld  157 (*)    BUN 34 (*)    Creatinine, Ser 1.64 (*)    GFR, Estimated 46 (*)    All other components within normal limits  URINALYSIS, ROUTINE W REFLEX MICROSCOPIC - Abnormal; Notable for the following components:   APPearance HAZY (*)    Protein, ur 30 (*)    Leukocytes,Ua SMALL (*)    Bacteria, UA RARE (*)    All other components within normal limits  MAGNESIUM - Abnormal; Notable for the following components:   Magnesium 2.5 (*)    All other components within normal limits  CBG MONITORING, ED - Abnormal; Notable for the following components:   Glucose-Capillary 172 (*)    All other components within normal limits  CBG MONITORING, ED - Abnormal; Notable for the following components:   Glucose-Capillary 230 (*)    All other components within normal limits  RESP PANEL BY RT-PCR (FLU A&B, COVID) [id number]    No orders to display    Medications  amoxicillin (AMOXIL) capsule 500 mg (has no administration in time range)     Procedures  /  Critical Care Procedures  ED Course and Medical Decision Making  I have reviewed the triage vital signs, the nursing notes, and pertinent available records from the EMR.  Social Determinants  Affecting Complexity of Care: Patient has no clinically significant social determinants affecting this chief complaint..   ED Course:    Medical Decision Making Problems Addressed: Dehydration: acute illness or injury    Details: discussed his increased creatinine and offered fluids, but patient requesting discharge and says he'll orally rehydrate and recheck labs at his PCP office. Strep throat: acute illness or injury    Details: Treat with amox  Risk Prescription drug management.     Consultants: No consultations were needed in caring for this patient.   Treatment and Plan: Emergency department workup does not suggest an emergent condition requiring admission or immediate intervention beyond  what has been performed at this time. The patient  is safe for discharge and has  been instructed to return immediately for worsening symptoms, change in  symptoms or any other concerns    Final Clinical Impressions(s) / ED Diagnoses     ICD-10-CM   1. Strep throat  J02.0     2. Acquired hypothyroidism  E03.9 levothyroxine (SYNTHROID) 200 MCG tablet    3. Dehydration  E86.0       ED Discharge Orders          Ordered    levothyroxine (SYNTHROID) 200 MCG tablet  Daily before breakfast       Note to Pharmacy: Must keep upcoming office visit for refills   09/15/21 0347    amoxicillin (AMOXIL) 500 MG capsule  3 times daily        09/15/21 0347              Discharge Instructions Discussed with and Provided to Patient:    Discharge Instructions      You need to make an appointment with your primary care doctor to have your blood work rechecked in 1 to 2 weeks.  You have signs of dehydration and need to drink more water.  If you are unable to do this at home, please return to the emergency department.  Your strep test was positive, this is an infection that will need to be treated with antibiotics.  I refilled your Synthroid as a courtesy.      Montine Circle, PA-C 09/15/21 [place], April, MD 09/15/21 [phone]

## 2021-09-15 NOTE — Discharge Instructions (Signed)
You need to make an appointment with your primary care doctor to have your blood work rechecked in 1 to 2 weeks.  You have signs of dehydration and need to drink more water.  If you are unable to do this at home, please return to the emergency department.  Your strep test was positive, this is an infection that will need to be treated with antibiotics.  I refilled your Synthroid as a courtesy.

## 2021-09-19 ENCOUNTER — Ambulatory Visit: Payer: Self-pay | Admitting: *Deleted

## 2021-09-19 NOTE — Telephone Encounter (Signed)
  Chief Complaint: requesting earlier appt for hospital f/u Symptoms: denies sx now. 09/14/21 lightheaded , seen in ED for strep throat , dehydration. Continues taking antibiotics. Reports BP and blood sugar WNL. Reports BP wrist monitor unreliable. Requesting new monitor to check BP.  Frequency: last week 09/14/21 Pertinent Negatives: Patient denies lightheadedness now  Disposition: '[]'$ ED /'[]'$ Urgent Care (no appt availability in office) / '[]'$ Appointment(In office/virtual)/ '[]'$  [place] [facility]/ '[]'$ Home Care/ '[]'$ Refused Recommended Disposition /'[]'$ [place] [facility]/ '[x]'$  Follow-up with PCP Additional Notes:   Appt already scheduled for 10/12/21 and requesting earlier appt. On waitlist. C/o in ED he was not treated fairly. Offered office of patient experience # and patient declined.  Reason for Disposition  [1] MILD dizziness (e.g., walking normally) AND [2] has been evaluated by doctor (or NP/PA) for this  Answer Assessment - Initial Assessment Questions 1. DESCRIPTION: "Describe your dizziness."     None now has been seen in ED last week  2. LIGHTHEADED: "Do you feel lightheaded?" (e.g., somewhat faint, woozy, weak upon standing)     no 3. VERTIGO: "Do you feel like either you or the room is spinning or tilting?" (i.e. vertigo)     na 4. SEVERITY: "How bad is it?"  "Do you feel like you are going to faint?" "Can you stand and walk?"   - MILD: Feels slightly dizzy, but walking normally.   - MODERATE: Feels unsteady when walking, but not falling; interferes with normal activities (e.g., school, work).   - SEVERE: Unable to walk without falling, or requires assistance to walk without falling; feels like passing out now.      None now  5. ONSET:  "When did the dizziness begin?"     Last week  6. AGGRAVATING FACTORS: "Does anything make it worse?" (e.g., standing, change in head position)     na 7. HEART RATE: "Can you tell me your heart rate?" "How many beats in 15 seconds?"  (Note: not all  patients can do this)       na 8. CAUSE: "What do you think is causing the dizziness?"     Ear infection  9. RECURRENT SYMPTOM: "Have you had dizziness before?" If Yes, ask: "When was the last time?" "What happened that time?"     na 10. OTHER SYMPTOMS: "Do you have any other symptoms?" (e.g., fever, chest pain, vomiting, diarrhea, bleeding)       None now reports blood sugar and blood pressure WNL at this time  11. PREGNANCY: "Is there any chance you are pregnant?" "When was your last menstrual period?"       na  Protocols used: Dizziness - Lightheadedness-A-AH

## 2021-09-19 NOTE — Telephone Encounter (Signed)
Summary: Lightheadedness   Pt says he is lightheaded      Called [phone] and reached patient's son. Son on [place] and not with patient . Called [phone] , no answer, LVMTCB [phone].

## 2021-09-21 NOTE — Telephone Encounter (Signed)
No sooner appts available.  Left message on voicemail to return call.  Advised to call office for options to sooner medical attention.

## 2021-09-28 ENCOUNTER — Other Ambulatory Visit: Payer: Self-pay

## 2021-09-29 ENCOUNTER — Other Ambulatory Visit: Payer: Self-pay

## 2021-10-04 ENCOUNTER — Other Ambulatory Visit: Payer: Self-pay

## 2021-10-12 ENCOUNTER — Other Ambulatory Visit: Payer: Self-pay

## 2021-10-12 ENCOUNTER — Ambulatory Visit: Payer: Commercial Managed Care - HMO | Attending: Physician Assistant | Admitting: Physician Assistant

## 2021-10-12 VITALS — BP 115/77 | HR 68 | Temp 98.6°F | Resp 16 | Ht 72.0 in | Wt 263.0 lb

## 2021-10-12 DIAGNOSIS — E1159 Type 2 diabetes mellitus with other circulatory complications: Secondary | ICD-10-CM | POA: Diagnosis not present

## 2021-10-12 DIAGNOSIS — E039 Hypothyroidism, unspecified: Secondary | ICD-10-CM

## 2021-10-12 DIAGNOSIS — M1A9XX0 Chronic gout, unspecified, without tophus (tophi): Secondary | ICD-10-CM | POA: Diagnosis not present

## 2021-10-12 DIAGNOSIS — N289 Disorder of kidney and ureter, unspecified: Secondary | ICD-10-CM

## 2021-10-12 DIAGNOSIS — E1165 Type 2 diabetes mellitus with hyperglycemia: Secondary | ICD-10-CM | POA: Diagnosis not present

## 2021-10-12 DIAGNOSIS — Z794 Long term (current) use of insulin: Secondary | ICD-10-CM | POA: Diagnosis not present

## 2021-10-12 DIAGNOSIS — I152 Hypertension secondary to endocrine disorders: Secondary | ICD-10-CM

## 2021-10-12 LAB — POCT GLYCOSYLATED HEMOGLOBIN (HGB A1C): Hemoglobin A1C: 8.5 % — AB (ref 4.0–5.6)

## 2021-10-12 LAB — GLUCOSE, POCT (MANUAL RESULT ENTRY): POC Glucose: 149 mg/dl — AB (ref 70–99)

## 2021-10-12 MED ORDER — METFORMIN HCL 500 MG PO TABS
ORAL_TABLET | ORAL | 1 refills | Status: DC
  Filled 2021-10-12: qty 360, 90d supply, fill #0
  Filled 2021-11-17: qty 120, 30d supply, fill #0
  Filled 2022-03-14: qty 120, 30d supply, fill #1
  Filled 2022-05-11: qty 360, 90d supply, fill #2
  Filled 2022-09-06 (×2): qty 120, 30d supply, fill #3

## 2021-10-12 MED ORDER — CARVEDILOL 6.25 MG PO TABS
ORAL_TABLET | Freq: Two times a day (BID) | ORAL | 6 refills | Status: DC
  Filled 2021-10-12: qty 60, 30d supply, fill #0
  Filled 2021-11-17: qty 60, 30d supply, fill #1

## 2021-10-12 MED ORDER — LOSARTAN POTASSIUM 100 MG PO TABS
100.0000 mg | ORAL_TABLET | Freq: Every day | ORAL | 6 refills | Status: DC
  Filled 2021-10-12: qty 30, 30d supply, fill #0
  Filled 2021-11-10: qty 90, 90d supply, fill #1
  Filled 2021-11-17: qty 30, 30d supply, fill #1
  Filled 2021-12-27: qty 30, 30d supply, fill #2
  Filled 2022-01-04: qty 90, 90d supply, fill #2
  Filled 2022-04-12: qty 30, 30d supply, fill #3
  Filled 2022-09-06 (×2): qty 30, 30d supply, fill #4

## 2021-10-12 MED ORDER — BASAGLAR KWIKPEN 100 UNIT/ML ~~LOC~~ SOPN
50.0000 [IU] | PEN_INJECTOR | Freq: Every day | SUBCUTANEOUS | 3 refills | Status: DC
  Filled 2021-10-12: qty 15, 30d supply, fill #0
  Filled 2021-11-17: qty 15, 30d supply, fill #1

## 2021-10-12 MED ORDER — ALLOPURINOL 300 MG PO TABS
300.0000 mg | ORAL_TABLET | Freq: Every day | ORAL | 3 refills | Status: DC
  Filled 2021-10-12: qty 30, 30d supply, fill #0
  Filled 2021-11-17: qty 30, 30d supply, fill #1
  Filled 2022-05-11: qty 30, 30d supply, fill #2
  Filled 2022-09-06 (×2): qty 30, 30d supply, fill #3

## 2021-10-12 MED ORDER — TRULICITY 3 MG/0.5ML ~~LOC~~ SOAJ
3.0000 mg | SUBCUTANEOUS | 4 refills | Status: DC
  Filled 2021-10-12 – 2021-11-30 (×3): qty 2, 28d supply, fill #0
  Filled 2021-12-27 – 2022-01-04 (×2): qty 2, 28d supply, fill #1
  Filled 2022-02-02: qty 2, 28d supply, fill #2
  Filled 2022-03-14: qty 2, 28d supply, fill #3

## 2021-10-12 MED ORDER — ATORVASTATIN CALCIUM 40 MG PO TABS
ORAL_TABLET | Freq: Every day | ORAL | 1 refills | Status: DC
  Filled 2021-10-12: qty 30, 30d supply, fill #0
  Filled 2021-11-10: qty 90, 90d supply, fill #1
  Filled 2021-11-17: qty 30, 30d supply, fill #1
  Filled 2021-12-27: qty 30, 30d supply, fill #2
  Filled 2022-01-04: qty 90, 90d supply, fill #2

## 2021-10-12 MED ORDER — LEVOTHYROXINE SODIUM 200 MCG PO TABS
ORAL_TABLET | Freq: Every day | ORAL | 2 refills | Status: DC
  Filled 2021-10-12: qty 30, 30d supply, fill #0
  Filled 2021-11-10: qty 60, 60d supply, fill #1
  Filled 2021-11-17: qty 30, 30d supply, fill #1
  Filled 2022-02-02: qty 30, 30d supply, fill #2

## 2021-10-12 MED ORDER — EMPAGLIFLOZIN 25 MG PO TABS
25.0000 mg | ORAL_TABLET | Freq: Every day | ORAL | 4 refills | Status: DC
  Filled 2021-10-12: qty 30, 30d supply, fill #0
  Filled 2021-11-10: qty 90, 90d supply, fill #1
  Filled 2021-11-17: qty 30, 30d supply, fill #1
  Filled 2022-01-04: qty 90, 90d supply, fill #2

## 2021-10-12 MED ORDER — AMLODIPINE BESYLATE 10 MG PO TABS
10.0000 mg | ORAL_TABLET | Freq: Every day | ORAL | 3 refills | Status: DC
  Filled 2021-10-12: qty 30, 30d supply, fill #0
  Filled 2021-11-17: qty 30, 30d supply, fill #1

## 2021-10-12 NOTE — Progress Notes (Signed)
Patient ID: Frenchie Dangerfield, male   DOB: 07/25/1955, 66 y.o.   MRN: [id number]   Ebenezer Mccaskey, is a 66 y.o. male  VQM:[id number]  [id number]  DOB - [birth year]  Chief Complaint  Patient presents with   Sore Throat    Dual ear infection, kidney malfunctioning, remaining tingle in throat        Subjective:   Dylann Gallier is a 66 y.o. male here today for med RF.  Says he is compliant with meds but there are some meds that seem to have not been filled in a while.  He tolerates trulicity without side effects.  He is taking 50 untis basaglar daily and he is taking his metformin.  Says he rarely misses a dose.  Not checking blood sugars consistently.    Seen at ED 1 month ago and diagnosed with strep. They did some bloodwork then.   No new issues or concerns today   No problems updated.  ALLERGIES: Allergies  Allergen Reactions   Vicodin [Hydrocodone-Acetaminophen] Hives and Rash    PAST MEDICAL HISTORY: Past Medical History:  Diagnosis Date   Anemia    Arthritis    Back pain, lumbosacral 06/30/2013   Blood transfusion without reported diagnosis    Cataract    BEGINNING   DM type 2 (diabetes mellitus, type 2) ([place]) 06/30/2013   Gout    HTN (hypertension) 06/30/2013   Hypertension    Hypothyroid 06/30/2013    MEDICATIONS AT HOME: Prior to Admission medications   Medication Sig Start Date End Date Taking? Authorizing Provider  Dulaglutide (TRULICITY) 3 XI/3.3AS SOPN Inject 3 mg as directed once a week. 09/06/21  Yes Charlott Rakes, MD  empagliflozin (JARDIANCE) 25 MG TABS tablet Take 1 tablet (25 mg total) by mouth daily before breakfast. 10/12/21  Yes Maddelynn Moosman M, PA-C  vitamin B-12 (CYANOCOBALAMIN) 1000 MCG tablet Take 1,000 mcg by mouth daily.   Yes [provider]  allopurinol (ZYLOPRIM) 300 MG tablet Take 1 tablet (300 mg total) by mouth daily. 10/12/21 11/11/21  Argentina Donovan, PA-C  amLODipine (NORVASC) 10 MG tablet Take 1 tablet (10 mg total) by mouth daily.  10/12/21 11/11/21  Argentina Donovan, PA-C  amoxicillin (AMOXIL) 500 MG capsule Take 1 capsule (500 mg total) by mouth 3 (three) times daily. Patient not taking: Reported on 10/12/2021 09/15/21   Montine Circle, PA-C  atorvastatin (LIPITOR) 40 MG tablet TAKE 1 TABLET (40 MG TOTAL) BY MOUTH DAILY. 10/12/21 10/12/22  Argentina Donovan, PA-C  carvedilol (COREG) 6.25 MG tablet TAKE 1 TABLET (6.25 MG TOTAL) BY MOUTH 2 (TWO) TIMES DAILY WITH A MEAL. 10/12/21 10/12/22  Argentina Donovan, PA-C  Continuous Blood Gluc Receiver (FREESTYLE LIBRE 14 DAY READER) DEVI 1 each by Does not apply route 3 (three) times daily with meals. Patient not taking: Reported on 10/12/2021 07/11/17   Charlott Rakes, MD  Continuous Blood Gluc Sensor (FREESTYLE LIBRE 14 DAY SENSOR) MISC 1 each by Does not apply route 3 (three) times daily with meals. Patient not taking: Reported on 10/12/2021 01/29/20   Charlott Rakes, MD  cyclobenzaprine (FLEXERIL) 5 MG tablet Take 1 tablet (5 mg total) by mouth 3 (three) times daily as needed. Patient not taking: Reported on 02/08/2021 09/11/20   Drenda Freeze, MD  Dulaglutide (TRULICITY) 3 NK/5.3ZJ SOPN Inject 3 mg as directed once a week. 10/12/21   Argentina Donovan, PA-C  fluticasone (FLONASE) 50 MCG/ACT nasal spray Place 2 sprays into both nostrils daily. Patient not taking: Reported  on 10/12/2021 08/28/18   Charlott Rakes, MD  gabapentin (NEURONTIN) 300 MG capsule Take 1 capsule (300 mg total) by mouth at bedtime. Patient not taking: Reported on 10/12/2021 02/24/20   Charlott Rakes, MD  glucose blood (BAYER CONTOUR TEST) test strip Use as instructed Patient not taking: Reported on 10/12/2021 07/08/17   Libby Maw, MD  hydrocortisone 1 % ointment Apply 1 application topically 2 (two) times daily. Patient not taking: Reported on 10/12/2021 11/12/18   Charlott Rakes, MD  ibuprofen (ADVIL) 800 MG tablet Take 1 tablet (800 mg total) by mouth 3 (three) times daily. Patient not taking:  Reported on 10/12/2021 09/11/20   Drenda Freeze, MD  Insulin Glargine [facility]) 100 UNIT/ML Inject 50 Units into the skin daily. 10/12/21   Argentina Donovan, PA-C  levothyroxine (SYNTHROID) 200 MCG tablet TAKE 1 TABLET BY MOUTH ONCE DAILY BEFORE BREAKFAST 10/12/21   Freeman Caldron M, PA-C  losartan (COZAAR) 100 MG tablet Take 1 tablet (100 mg total) by mouth daily. 10/12/21 11/11/21  Argentina Donovan, PA-C  meclizine (ANTIVERT) 25 MG tablet Take 1 tablet (25 mg total) by mouth 2 (two) times daily as needed for dizziness. Patient not taking: Reported on 10/12/2021 08/19/18   Kerin Perna, NP  metFORMIN (GLUCOPHAGE) 500 MG tablet TAKE 2 TABLETS BY MOUTH TWICE DAILY WITH A MEAL 10/12/21 10/12/22  Argentina Donovan, PA-C  metoCLOPramide (REGLAN) 5 MG tablet Take 1 tablet (5 mg total) by mouth 3 (three) times daily before meals. Patient not taking: Reported on 10/12/2021 09/15/19   Charlott Rakes, MD  omeprazole (PRILOSEC) 20 MG capsule TAKE 1 CAPSULE (20 MG TOTAL) BY MOUTH DAILY. 12/16/19 12/15/20  Charlott Rakes, MD  polyethylene glycol (MIRALAX / GLYCOLAX) 17 g packet Take 17 g by mouth daily. Patient not taking: Reported on 10/12/2021 09/11/20   Drenda Freeze, MD  Spacer/Aero-Holding Chambers (AEROCHAMBER PLUS) inhaler Use as instructed Patient not taking: Reported on 10/12/2021 04/03/18   Melynda Ripple, MD  TRUEPLUS PEN NEEDLES 31G X 6 MM MISC USE AS DIRECTED AT BEDTIME Patient not taking: Reported on 10/12/2021 01/28/20   Charlott Rakes, MD    ROS: Neg HEENT Neg resp Neg cardiac Neg GI Neg GU Neg MS Neg psych Neg neuro  Objective:   Vitals:   10/12/21 1056  BP: 115/77  Pulse: 68  Resp: 16  Temp: 98.6 F (37 C)  SpO2: 98%  Weight: 263 lb (119.3 kg)  Height: 6' (1.829 m)   Exam General appearance : Awake, alert, not in any distress. Speech Clear. Not toxic looking HEENT: Atraumatic and Normocephalic Neck: Supple, no JVD. No cervical lymphadenopathy.   Chest: Good air entry bilaterally, CTAB.  No rales/rhonchi/wheezing CVS: S1 S2 regular, no murmurs.  Extremities: B/L Lower Ext shows no edema, both legs are warm to touch Neurology: Awake alert, and oriented X 3, CN II-XII intact, Non focal Skin: No Rash  Data Review Lab Results  Component Value Date   HGBA1C 8.5 (A) 10/12/2021   HGBA1C 8.3 (A) 02/08/2021   HGBA1C 7.5 (A) 02/24/2020    Assessment & Plan   1. Type 2 diabetes mellitus with hyperglycemia, with long-term current use of insulin (HCC) Uncontrolled-not at goal.  Add jardiance.  Check CMP at f/up 1 month(future order placed)  discussed with Benard Halsted, Pharm D - Glucose (CBG) - HgB A1c - empagliflozin (JARDIANCE) 25 MG TABS tablet; Take 1 tablet (25 mg total) by mouth daily before breakfast.  Dispense: 30 tablet;  Refill: 4 - Dulaglutide (TRULICITY) 3 EN/2.7PO SOPN; Inject 3 mg as directed once a week.  Dispense: 2 mL; Refill: 4 - atorvastatin (LIPITOR) 40 MG tablet; TAKE 1 TABLET (40 MG TOTAL) BY MOUTH DAILY.  Dispense: 90 tablet; Refill: 1 - metFORMIN (GLUCOPHAGE) 500 MG tablet; TAKE 2 TABLETS BY MOUTH TWICE DAILY WITH A MEAL  Dispense: 360 tablet; Refill: 1 - Insulin Glargine (BASAGLAR KWIKPEN) 100 UNIT/ML; Inject 50 Units into the skin daily.  Dispense: 15 mL; Refill: 3 Check your blood sugars fasting and bedtime and record and bring to next visit.  Drink 80 ounces water daily   2. Hypertension associated with diabetes ([place]) - carvedilol (COREG) 6.25 MG tablet; TAKE 1 TABLET (6.25 MG TOTAL) BY MOUTH 2 (TWO) TIMES DAILY WITH A MEAL.  Dispense: 60 tablet; Refill: 6 - losartan (COZAAR) 100 MG tablet; Take 1 tablet (100 mg total) by mouth daily.  Dispense: 30 tablet; Refill: 6 - amLODipine (NORVASC) 10 MG tablet; Take 1 tablet (10 mg total) by mouth daily.  Dispense: 30 tablet; Refill: 3  3. Hypothyroidism, unspecified type - Thyroid Panel With TSH  4. Acquired hypothyroidism - levothyroxine (SYNTHROID) 200 MCG  tablet; TAKE 1 TABLET BY MOUTH ONCE DAILY BEFORE BREAKFAST  Dispense: 30 tablet; Refill: 2  5. Chronic gout without tophus, unspecified cause, unspecified site - allopurinol (ZYLOPRIM) 300 MG tablet; Take 1 tablet (300 mg total) by mouth daily.  Dispense: 30 tablet; Refill: 3    Return for 1 month with Lurena Joiner;  3 months with Dr Margarita Rana.  The patient was given clear instructions to go to ER or return to medical center if symptoms don't improve, worsen or new problems develop. The patient verbalized understanding. The patient was told to call to get lab results if they haven't heard anything in the next week.      Freeman Caldron, PA-C [facility], [place]   10/12/2021, 11:54 AM

## 2021-10-12 NOTE — Patient Instructions (Signed)
Office of Patient Experience at [phone] or [email or web address].  Check your blood sugars fasting and bedtime and record and bring to next visit.  Drink 80 ounces water daily

## 2021-10-13 LAB — THYROID PANEL WITH TSH
Free Thyroxine Index: 2.3 (ref 1.2–4.9)
T3 Uptake Ratio: 33 % (ref 24–39)
T4, Total: 6.9 ug/dL (ref 4.5–12.0)
TSH: 3.27 u[IU]/mL (ref 0.450–4.500)

## 2021-10-19 ENCOUNTER — Other Ambulatory Visit: Payer: Self-pay

## 2021-10-31 ENCOUNTER — Other Ambulatory Visit: Payer: Self-pay

## 2021-11-10 ENCOUNTER — Other Ambulatory Visit: Payer: Self-pay

## 2021-11-10 ENCOUNTER — Other Ambulatory Visit (HOSPITAL_COMMUNITY): Payer: Self-pay

## 2021-11-13 ENCOUNTER — Other Ambulatory Visit (HOSPITAL_COMMUNITY): Payer: Self-pay

## 2021-11-17 ENCOUNTER — Ambulatory Visit: Payer: Commercial Managed Care - HMO | Attending: Nurse Practitioner | Admitting: Pharmacist

## 2021-11-17 ENCOUNTER — Other Ambulatory Visit: Payer: Self-pay

## 2021-11-17 DIAGNOSIS — E1165 Type 2 diabetes mellitus with hyperglycemia: Secondary | ICD-10-CM

## 2021-11-17 DIAGNOSIS — Z794 Long term (current) use of insulin: Secondary | ICD-10-CM | POA: Diagnosis not present

## 2021-11-17 MED ORDER — TRUEPLUS PEN NEEDLES 31G X 6 MM MISC
5 refills | Status: DC
  Filled 2021-11-17: qty 100, 25d supply, fill #0

## 2021-11-17 NOTE — Progress Notes (Signed)
    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 Presents for diabetes evaluation, education, and management. Patient was referred and last seen by Freeman Caldron on 10/12/2021. A1c at that visit was up from 8.3 to 8.5%. Jardiance was added to his regimen.  Family/Social History:  -Fhx: no pertinent positives -Tobacco use: Former smoker (quit 1985)  Insurance coverage/medication affordability: [employer]  Medication adherence reported, however, several fills are overdue. Metformin, Jardiance, and Basaglar are anywhere from 2-4 weeks late according to our pharmacy records.    Current diabetes medications include: Basaglar 50 units daily, Trulicity 3 mg weekly, metformin 1000 mg BID, Jardiance 25 mg daily  Patient denies hypoglycemic events.   Patient reported dietary habits:  Breakfast: green beans, ham biscuit  Lunch: green beans, roasted chicken, limits fast food, steak every now and then, fish, [place] Dinner: limits starches Snacks: ritz crackers with cheese Drinks: water, cranberry juice, limits sodas, Gatorade every now and then  Patient-reported exercise habits: 20-30 minutes every other day   Patient denies nocturia (nighttime urination).  Patient denies neuropathy (nerve pain). Patient denies visual changes. Patient reports self foot exams.    O:   CGM data from Freestyle meter: Most readings are in the 110s - 140s. He has several in the 150s-160s withonly 1 outlier of 237.    Lab Results  Component Value Date   HGBA1C 8.5 (A) 10/12/2021   There were no vitals filed for this visit.  Lipid Panel     Component Value Date/Time   CHOL 110 12/16/2019 1140   TRIG 92 12/16/2019 1140   HDL 32 (L) 12/16/2019 1140   CHOLHDL 3.4 12/16/2019 1140   CHOLHDL 3.6 08/08/2015 0845   VLDL 34 (H) 08/08/2015 0845   LDLCALC 60 12/16/2019 1140    Clinical Atherosclerotic Cardiovascular Disease (ASCVD): No  The ASCVD Risk score  (Arnett DK, et al., 2019) failed to calculate for the following reasons:   The valid total cholesterol range is 130 to 320 mg/dL    A/P: Diabetes longstanding currently uncontrolled with A1C, however, sugars seem to be improving. Medication adherence appears optimal.  -Continued Lantus 50 units daily. -Continued Trulicity '3mg'$  weekly. -Continued Metformin 1000 mg BID. -Continued Jardiance 25 mg daily.  -Extensively discussed pathophysiology of diabetes, recommended lifestyle interventions, dietary effects on blood sugar control -Counseled on s/sx of and management of hypoglycemia and patient verbalized understanding. -Next A1C anticipated 12/2021.  Written patient instructions provided.  Total time in face to face counseling 25 minutes.   Follow up w/ PCP 12/06/2021.   Benard Halsted, PharmD, [place], [place] [phone]

## 2021-11-21 ENCOUNTER — Other Ambulatory Visit (HOSPITAL_COMMUNITY): Payer: Self-pay

## 2021-11-23 ENCOUNTER — Other Ambulatory Visit: Payer: Self-pay

## 2021-11-30 ENCOUNTER — Other Ambulatory Visit: Payer: Self-pay

## 2021-12-01 ENCOUNTER — Other Ambulatory Visit: Payer: Self-pay

## 2021-12-06 ENCOUNTER — Ambulatory Visit: Payer: Commercial Managed Care - HMO | Attending: Family Medicine | Admitting: Family Medicine

## 2021-12-06 ENCOUNTER — Other Ambulatory Visit: Payer: Self-pay

## 2021-12-06 ENCOUNTER — Encounter: Payer: Self-pay | Admitting: Family Medicine

## 2021-12-06 VITALS — BP 132/81 | HR 83 | Temp 98.4°F | Ht 72.0 in | Wt 265.2 lb

## 2021-12-06 DIAGNOSIS — I152 Hypertension secondary to endocrine disorders: Secondary | ICD-10-CM

## 2021-12-06 DIAGNOSIS — R399 Unspecified symptoms and signs involving the genitourinary system: Secondary | ICD-10-CM | POA: Diagnosis not present

## 2021-12-06 DIAGNOSIS — R5383 Other fatigue: Secondary | ICD-10-CM | POA: Diagnosis not present

## 2021-12-06 DIAGNOSIS — E1165 Type 2 diabetes mellitus with hyperglycemia: Secondary | ICD-10-CM

## 2021-12-06 DIAGNOSIS — Z23 Encounter for immunization: Secondary | ICD-10-CM | POA: Diagnosis not present

## 2021-12-06 DIAGNOSIS — E039 Hypothyroidism, unspecified: Secondary | ICD-10-CM | POA: Diagnosis not present

## 2021-12-06 DIAGNOSIS — E1159 Type 2 diabetes mellitus with other circulatory complications: Secondary | ICD-10-CM | POA: Diagnosis not present

## 2021-12-06 DIAGNOSIS — Z794 Long term (current) use of insulin: Secondary | ICD-10-CM

## 2021-12-06 MED ORDER — BASAGLAR KWIKPEN 100 UNIT/ML ~~LOC~~ SOPN
48.0000 [IU] | PEN_INJECTOR | Freq: Every day | SUBCUTANEOUS | 3 refills | Status: DC
  Filled 2021-12-06 – 2022-07-10 (×2): qty 30, 62d supply, fill #0

## 2021-12-06 NOTE — Progress Notes (Signed)
Subjective:  Patient ID: Ray Garner, male    DOB: 1955-05-21  Age: 66 y.o. MRN: [id number]  CC: Diabetes   HPI Ray Garner is a 66 y.o. year old male with a history of type 2 diabetes mellitus (A1c 8.3), hypertension, hypothyroidism, Gout here for follow-up visit.  Interval History:  Ray Garner was added at his last office visit and he is wondering if this is resulting in his low blood sugars. 2 days ago his blood sugar dropped and he broke out in a cold sweat which he treated with a glucose tab and peanut butter cracker. Sugar was 39 at 6am that day and he has some other low readings of 64 and 80. Other random levels are less than 160 He skips his Basaglar when his sugar is low at around 60 or there about like this morning. Sometimes he skips meals as he is trying to loose weight.  He Complains of low energy and would like his testosterone checked. Endorses adherence with his levothyroxine for his hypothyroidism and is doing well on his statin.  Also doing well on his antihypertensive.  Denies presence of gout flares. He has had some nocturia and urgency but denies presence of hesitancy, dribbling, sense of incomplete voiding.  He states symptoms are not that bothersome. Past Medical History:  Diagnosis Date   Anemia    Arthritis    Back pain, lumbosacral 06/30/2013   Blood transfusion without reported diagnosis    Cataract    BEGINNING   DM type 2 (diabetes mellitus, type 2) ([place]) 06/30/2013   Gout    HTN (hypertension) 06/30/2013   Hypertension    Hypothyroid 06/30/2013    Past Surgical History:  Procedure Laterality Date   CYST EXCISION     FRACTURE SURGERY      Family History  Problem Relation Age of Onset   Breast cancer Niece    Colon cancer Neg Hx    Colon polyps Neg Hx    Esophageal cancer Neg Hx    Rectal cancer Neg Hx    Stomach cancer Neg Hx     Social History   Socioeconomic History   Marital status: Divorced    Spouse name: Not on file   Number of  children: Not on file   Years of education: Not on file   Highest education level: Not on file  Occupational History   Not on file  Tobacco Use   Smoking status: Former    Types: Cigarettes    Quit date: 05/14/1983    Years since quitting: 38.5   Smokeless tobacco: Former  Scientific laboratory technician Use: Former  Substance and Sexual Activity   Alcohol use: Yes    Comment: OCC.   Drug use: No   Sexual activity: Not on file  Other Topics Concern   Not on file  Social History Narrative   Not on file   Social Determinants of Health   Financial Resource Strain: Not on file  Food Insecurity: Not on file  Transportation Needs: Not on file  Physical Activity: Not on file  Stress: Not on file  Social Connections: Not on file    Allergies  Allergen Reactions   Vicodin [Hydrocodone-Acetaminophen] Hives and Rash    Outpatient Medications Prior to Visit  Medication Sig Dispense Refill   allopurinol (ZYLOPRIM) 300 MG tablet Take 1 tablet (300 mg total) by mouth daily. 30 tablet 3   amLODipine (NORVASC) 10 MG tablet Take 1 tablet (10 mg total) by  mouth daily. 30 tablet 3   atorvastatin (LIPITOR) 40 MG tablet TAKE 1 TABLET (40 MG TOTAL) BY MOUTH DAILY. 90 tablet 1   carvedilol (COREG) 6.25 MG tablet TAKE 1 TABLET (6.25 MG TOTAL) BY MOUTH 2 (TWO) TIMES DAILY WITH A MEAL. 60 tablet 6   Continuous Blood Gluc Receiver (FREESTYLE LIBRE 14 DAY READER) DEVI 1 each by Does not apply route 3 (three) times daily with meals. 1 Device 0   Continuous Blood Gluc Sensor (FREESTYLE LIBRE 14 DAY SENSOR) MISC 1 each by Does not apply route 3 (three) times daily with meals. 1 each 11   cyclobenzaprine (FLEXERIL) 5 MG tablet Take 1 tablet (5 mg total) by mouth 3 (three) times daily as needed. 10 tablet 0   Dulaglutide (TRULICITY) 3 UT/6.5YY SOPN Inject 3 mg as directed once a week. 2 mL 2   Dulaglutide (TRULICITY) 3 TK/3.5WS SOPN Inject 3 mg as directed once a week. 2 mL 4   empagliflozin (JARDIANCE) 25 MG TABS  tablet Take 1 tablet (25 mg total) by mouth daily before breakfast. 30 tablet 4   fluticasone (FLONASE) 50 MCG/ACT nasal spray Place 2 sprays into both nostrils daily. 16 g 1   gabapentin (NEURONTIN) 300 MG capsule Take 1 capsule (300 mg total) by mouth at bedtime. 30 capsule 6   glucose blood (BAYER CONTOUR TEST) test strip Use as instructed 100 each 12   hydrocortisone 1 % ointment Apply 1 application topically 2 (two) times daily. 30 g 1   ibuprofen (ADVIL) 800 MG tablet Take 1 tablet (800 mg total) by mouth 3 (three) times daily. 21 tablet 0   Insulin Pen Needle (TRUEPLUS PEN NEEDLES) 31G X 6 MM MISC USE AS DIRECTED. 100 each 5   levothyroxine (SYNTHROID) 200 MCG tablet TAKE 1 TABLET BY MOUTH ONCE DAILY BEFORE BREAKFAST 30 tablet 2   losartan (COZAAR) 100 MG tablet Take 1 tablet (100 mg total) by mouth daily. 30 tablet 6   meclizine (ANTIVERT) 25 MG tablet Take 1 tablet (25 mg total) by mouth 2 (two) times daily as needed for dizziness. 30 tablet 0   metFORMIN (GLUCOPHAGE) 500 MG tablet TAKE 2 TABLETS BY MOUTH TWICE DAILY WITH A MEAL 360 tablet 1   metoCLOPramide (REGLAN) 5 MG tablet Take 1 tablet (5 mg total) by mouth 3 (three) times daily before meals. 90 tablet 1   polyethylene glycol (MIRALAX / GLYCOLAX) 17 g packet Take 17 g by mouth daily. 14 each 0   Spacer/Aero-Holding Chambers (AEROCHAMBER PLUS) inhaler Use as instructed 1 each 2   vitamin B-12 (CYANOCOBALAMIN) 1000 MCG tablet Take 1,000 mcg by mouth daily.     Insulin Glargine (BASAGLAR KWIKPEN) 100 UNIT/ML Inject 50 Units into the skin daily. 15 mL 3   amoxicillin (AMOXIL) 500 MG capsule Take 1 capsule (500 mg total) by mouth 3 (three) times daily. (Patient not taking: Reported on 10/12/2021) 30 capsule 0   omeprazole (PRILOSEC) 20 MG capsule TAKE 1 CAPSULE (20 MG TOTAL) BY MOUTH DAILY. 90 capsule 1   No facility-administered medications prior to visit.     ROS Review of Systems  Constitutional:  Negative for activity change  and appetite change.  HENT:  Negative for sinus pressure and sore throat.   Respiratory:  Negative for chest tightness, shortness of breath and wheezing.   Cardiovascular:  Negative for chest pain and palpitations.  Gastrointestinal:  Negative for abdominal distention, abdominal pain and constipation.  Genitourinary: Negative.   Musculoskeletal: Negative.  Psychiatric/Behavioral:  Negative for behavioral problems and dysphoric mood.     Objective:  BP 132/81   Pulse 83   Temp 98.4 F (36.9 C) (Oral)   Ht 6' (1.829 m)   Wt 265 lb 3.2 oz (120.3 kg)   SpO2 98%   BMI 35.97 kg/m      12/06/2021    3:39 PM 10/12/2021   10:56 AM 09/15/2021    4:17 AM  BP/Weight  Systolic BP 884 166 063  Diastolic BP 81 77 99  Wt. (Lbs) 265.2 263   BMI 35.97 kg/m2 35.67 kg/m2       Physical Exam Constitutional:      Appearance: He is well-developed.  Cardiovascular:     Rate and Rhythm: Normal rate.     Heart sounds: Normal heart sounds. No murmur heard. Pulmonary:     Effort: Pulmonary effort is normal.     Breath sounds: Normal breath sounds. No wheezing or rales.  Chest:     Chest wall: No tenderness.  Abdominal:     General: Bowel sounds are normal. There is no distension.     Palpations: Abdomen is soft. There is no mass.     Tenderness: There is no abdominal tenderness.  Musculoskeletal:        General: Normal range of motion.     Right lower leg: No edema.     Left lower leg: No edema.  Neurological:     Mental Status: He is alert and oriented to person, place, and time.  Psychiatric:        Mood and Affect: Mood normal.    Diabetic Foot Exam - Simple   Simple Foot Form Visual Inspection Sensation Testing Intact to touch and monofilament testing bilaterally: Yes Pulse Check Posterior Tibialis and Dorsalis pulse intact bilaterally: Yes Comments Hammertoes bilaterally.  No ulceration or skin breakdown        Latest Ref Rng & Units 09/14/2021    6:02 PM 09/11/2020     1:43 PM 12/16/2019   11:40 AM  CMP  Glucose 70 - 99 mg/dL 157  175  111   BUN 8 - 23 mg/dL 34  10  7   Creatinine 0.61 - 1.24 mg/dL 1.64  0.90  0.79   Sodium 135 - 145 mmol/L 136  136  142   Potassium 3.5 - 5.1 mmol/L 4.1  4.6  3.9   Chloride 98 - 111 mmol/L 103  102  104   CO2 22 - 32 mmol/L '24  28  23   '$ Calcium 8.9 - 10.3 mg/dL 9.4  9.4  9.3   Total Protein 6.5 - 8.1 g/dL  8.1  7.4   Total Bilirubin 0.3 - 1.2 mg/dL  0.7  0.5   Alkaline Phos 38 - 126 U/L  76  102   AST 15 - 41 U/L  21  19   ALT 0 - 44 U/L  21  16     Lipid Panel     Component Value Date/Time   CHOL 110 12/16/2019 1140   TRIG 92 12/16/2019 1140   HDL 32 (L) 12/16/2019 1140   CHOLHDL 3.4 12/16/2019 1140   CHOLHDL 3.6 08/08/2015 0845   VLDL 34 (H) 08/08/2015 0845   LDLCALC 60 12/16/2019 1140    CBC    Component Value Date/Time   WBC 14.1 (H) 09/14/2021 1802   RBC 5.21 09/14/2021 1802   HGB 14.3 09/14/2021 1802   HGB 13.2 08/28/2018 1507   HCT  43.9 09/14/2021 1802   HCT 39.0 08/28/2018 1507   PLT 351 09/14/2021 1802   PLT 274 08/28/2018 1507   MCV 84.3 09/14/2021 1802   MCV 84 08/28/2018 1507   MCH 27.4 09/14/2021 1802   MCHC 32.6 09/14/2021 1802   RDW 13.6 09/14/2021 1802   RDW 13.4 08/28/2018 1507   LYMPHSABS 3.5 09/14/2021 1802   LYMPHSABS 3.6 (H) 08/28/2018 1507   MONOABS 1.0 09/14/2021 1802   EOSABS 0.2 09/14/2021 1802   EOSABS 0.2 08/28/2018 1507   BASOSABS 0.1 09/14/2021 1802   BASOSABS 0.0 08/28/2018 1507    Lab Results  Component Value Date   HGBA1C 8.5 (A) 10/12/2021   Lab Results  Component Value Date   TSH 3.270 10/12/2021     Assessment & Plan:  1. Type 2 diabetes mellitus with hyperglycemia, with long-term current use of insulin (HCC) Uncontrolled with A1c of 8.5 He is starting to experience some hypoglycemia hence I have decreased his dose of Basaglar by 2 units and advised him to decrease further by 2 units if hypoglycemia continues to occur Continue current  regimen He has been reassured regarding Jardiance Counseled on Diabetic diet, my plate method, 097 minutes of moderate intensity exercise/week Blood sugar logs with fasting goals of 80-120 mg/dl, random of less than 180 and in the event of sugars less than 60 mg/dl or greater than 400 mg/dl encouraged to notify the clinic. Advised on the need for annual eye exams, annual foot exams, Pneumonia vaccine. - Insulin Glargine (BASAGLAR KWIKPEN) 100 UNIT/ML; Inject 48 Units into the skin daily.  Dispense: 30 mL; Refill: 3  2. Lower urinary tract symptoms He states symptoms are not that bothersome and declines initiation of Flomax as he does not want to take an additional pill - PSA, total and free  3. Other fatigue - VITAMIN D 25 Hydroxy (Vit-D Deficiency, Fractures) - Testosterone, Free, Total, SHBG  4. Hypertension associated with diabetes ([place]) Controlled Continue current regimen Counseled on blood pressure goal of less than 130/80, low-sodium, DASH diet, medication compliance, 150 minutes of moderate intensity exercise per week. Discussed medication compliance, adverse effects.  5. Acquired hypothyroidism Controlled Continue current dose of levothyroxine  6. Need for pneumococcal vaccine - Pneumococcal conjugate vaccine 20-valent  7. Flu vaccine need - Flu Vaccine QUAD High Dose(Fluad)    Meds ordered this encounter  Medications   Insulin Glargine (BASAGLAR KWIKPEN) 100 UNIT/ML    Sig: Inject 48 Units into the skin daily.    Dispense:  30 mL    Refill:  3    Dose decrease    Follow-up: Return in about 3 months (around 03/08/2022) for Chronic medical conditions.       Charlott Rakes, MD, FAAFP. [facility] [place], [place]   12/06/2021, 5:19 PM

## 2021-12-06 NOTE — Progress Notes (Signed)
Discuss Jardiance.

## 2021-12-06 NOTE — Patient Instructions (Signed)
Hypoglycemia Hypoglycemia occurs when the level of sugar (glucose) in the blood is too low. Hypoglycemia can happen in people who have or do not have diabetes. It can develop quickly, and it can be a medical emergency. For most people, a blood glucose level below 70 mg/dL (3.9 mmol/L) is considered hypoglycemia. Glucose is a type of sugar that provides the body's main source of energy. Certain hormones (insulin and glucagon) control the level of glucose in the blood. Insulin lowers blood glucose, and glucagon raises blood glucose. Hypoglycemia can result from having too much insulin in the bloodstream, or from not eating enough food that contains glucose. You may also have reactive hypoglycemia, which happens within 4 hours after eating a meal. What are the causes? Hypoglycemia occurs most often in people who have diabetes and may be caused by: Diabetes medicine. Not eating enough, or not eating often enough. Increased physical activity. Drinking alcohol on an empty stomach. If you do not have diabetes, hypoglycemia may be caused by: A tumor in the pancreas. Not eating enough, or not eating for long periods at a time (fasting). A severe infection or illness. Problems after having bariatric surgery. Organ failure, such as kidney or liver failure. Certain medicines. What increases the risk? Hypoglycemia is more likely to develop in people who: Have diabetes and take medicines to lower blood glucose. Abuse alcohol. Have a severe illness. What are the signs or symptoms? Symptoms vary depending on whether the condition is mild, moderate, or severe. Mild hypoglycemia Hunger. Sweating and feeling clammy. Dizziness or feeling light-headed. Sleepiness or restless sleep. Nausea. Increased heart rate. Headache. Blurry vision. Mood changes, such as irritability or anxiety. Tingling or numbness around the mouth, lips, or tongue. Moderate hypoglycemia Confusion and poor judgment. Behavior  changes. Weakness. Irregular heartbeat. A change in coordination. Severe hypoglycemia Severe hypoglycemia is a medical emergency. It can cause: Fainting. Seizures. Loss of consciousness (coma). Death. How is this diagnosed? Hypoglycemia is diagnosed with a blood test to measure your blood glucose level. This blood test is done while you are having symptoms. Your health care provider may also do a physical exam and review your medical history. How is this treated? This condition can be treated by immediately eating or drinking something that contains sugar with 15 grams of fast-acting carbohydrate, such as: 4 oz (120 mL) of fruit juice. 4 oz (120 mL) of regular soda (not diet soda). Several pieces of hard candy. Check food labels to find out how many pieces to eat for 15 grams. 1 Tbsp (15 mL) of sugar or honey. 4 glucose tablets. 1 tube of glucose gel. Treating hypoglycemia if you have diabetes If you are alert and able to swallow safely, follow the 15:15 rule: Take 15 grams of a fast-acting carbohydrate. Talk with your health care provider about how much you should take. Options for getting 15 grams of fast-acting carbohydrate include: Glucose tablets (take 4 tablets). Several pieces of hard candy. Check food labels to find out how many pieces to eat for 15 grams. 4 oz (120 mL) of fruit juice. 4 oz (120 mL) of regular soda (not diet soda). 1 Tbsp (15 mL) of sugar or honey. 1 tube of glucose gel. Check your blood glucose 15 minutes after you take the carbohydrate. If the repeat blood glucose level is still at or below 70 mg/dL (3.9 mmol/L), take 15 grams of a carbohydrate again. If your blood glucose level does not increase above 70 mg/dL (3.9 mmol/L) after 3 tries, seek emergency  medical care. After your blood glucose level returns to normal, eat a meal or a snack within 1 hour.  Treating severe hypoglycemia Severe hypoglycemia is when your blood glucose level is below 54 mg/dL (3  mmol/L). Severe hypoglycemia is a medical emergency. Get medical help right away. If you have severe hypoglycemia and you cannot eat or drink, you will need to be given glucagon. A family member or close friend should learn how to check your blood glucose and how to give you glucagon. Ask your health care provider if you need to have an emergency glucagon kit available. Severe hypoglycemia may need to be treated in a hospital. The treatment may include getting glucose through an IV. You may also need treatment for the cause of your hypoglycemia. Follow these instructions at home:  General instructions Take over-the-counter and prescription medicines only as told by your health care provider. Monitor your blood glucose as told by your health care provider. If you drink alcohol: Limit how much you have to: 0-1 drink a day for women who are not pregnant. 0-2 drinks a day for men. Know how much alcohol is in your drink. In the [place] one drink equals one 12 oz bottle of beer (355 mL), one 5 oz glass of wine (148 mL), or one 1 oz glass of hard liquor (44 mL). Be sure to eat food along with drinking alcohol. Be aware that alcohol is absorbed quickly and may have lingering effects that may result in hypoglycemia later. Be sure to do ongoing glucose monitoring. Keep all follow-up visits. This is important. If you have diabetes: Always have a fast-acting carbohydrate (15 grams) option with you to treat low blood glucose. Follow your diabetes management plan as directed by your health care provider. Make sure you: Know the symptoms of hypoglycemia. It is important to treat it right away to prevent it from becoming severe. Check your blood glucose as often as told. Always check before and after exercise. Always check your blood glucose before you drive a motorized vehicle. Take your medicines as told. Follow your meal plan. Eat on time, and do not skip meals. Share your diabetes management plan with  people in your workplace, school, and household. Carry a medical alert card or wear medical alert jewelry. Where to find more information American Diabetes Association: www.diabetes.org Contact a health care provider if: You have problems keeping your blood glucose in your target range. You have frequent episodes of hypoglycemia. Get help right away if: You continue to have hypoglycemia symptoms after eating or drinking something that contains 15 grams of fast-acting carbohydrate, and you cannot get your blood glucose above 70 mg/dL (3.9 mmol/L) while following the 15:15 rule. Your blood glucose is below 54 mg/dL (3 mmol/L). You have a seizure. You faint. These symptoms may represent a serious problem that is an emergency. Do not wait to see if the symptoms will go away. Get medical help right away. Call your local emergency services (911 in the [place] Do not drive yourself to the hospital. Summary Hypoglycemia occurs when the level of sugar (glucose) in the blood is too low. Hypoglycemia can happen in people who have or do not have diabetes. It can develop quickly, and it can be a medical emergency. Make sure you know the symptoms of hypoglycemia and how to treat it. Always have a fast-acting carbohydrate option with you to treat low blood sugar. This information is not intended to replace advice given to you by your health care provider.  Make sure you discuss any questions you have with your health care provider. Document Revised: 01/14/2020 Document Reviewed: 01/14/2020 Elsevier Patient Education  [place].

## 2021-12-08 ENCOUNTER — Other Ambulatory Visit: Payer: Self-pay

## 2021-12-08 ENCOUNTER — Other Ambulatory Visit: Payer: Self-pay | Admitting: Family Medicine

## 2021-12-08 MED ORDER — ERGOCALCIFEROL 1.25 MG (50000 UT) PO CAPS
50000.0000 [IU] | ORAL_CAPSULE | ORAL | 0 refills | Status: DC
  Filled 2021-12-08: qty 12, 84d supply, fill #0

## 2021-12-12 LAB — PSA, TOTAL AND FREE
PSA, Free Pct: 21.8 %
PSA, Free: 0.24 ng/mL
Prostate Specific Ag, Serum: 1.1 ng/mL (ref 0.0–4.0)

## 2021-12-12 LAB — TESTOSTERONE, FREE, TOTAL, SHBG
Sex Hormone Binding: 44.6 nmol/L (ref 19.3–76.4)
Testosterone, Free: 6.2 pg/mL — ABNORMAL LOW (ref 6.6–18.1)
Testosterone: 272 ng/dL (ref 264–916)

## 2021-12-12 LAB — VITAMIN D 25 HYDROXY (VIT D DEFICIENCY, FRACTURES): Vit D, 25-Hydroxy: 20.2 ng/mL — ABNORMAL LOW (ref 30.0–100.0)

## 2021-12-15 ENCOUNTER — Other Ambulatory Visit: Payer: Self-pay

## 2021-12-27 ENCOUNTER — Other Ambulatory Visit: Payer: Self-pay

## 2022-01-03 ENCOUNTER — Other Ambulatory Visit: Payer: Self-pay

## 2022-01-04 ENCOUNTER — Other Ambulatory Visit (HOSPITAL_COMMUNITY): Payer: Self-pay

## 2022-01-04 ENCOUNTER — Other Ambulatory Visit: Payer: Self-pay

## 2022-01-09 ENCOUNTER — Other Ambulatory Visit (HOSPITAL_COMMUNITY): Payer: Self-pay

## 2022-01-15 DIAGNOSIS — I700 Atherosclerosis of aorta: Secondary | ICD-10-CM | POA: Diagnosis not present

## 2022-01-15 DIAGNOSIS — Z6835 Body mass index (BMI) 35.0-35.9, adult: Secondary | ICD-10-CM | POA: Diagnosis not present

## 2022-01-15 DIAGNOSIS — E785 Hyperlipidemia, unspecified: Secondary | ICD-10-CM | POA: Diagnosis not present

## 2022-01-15 DIAGNOSIS — F17211 Nicotine dependence, cigarettes, in remission: Secondary | ICD-10-CM | POA: Diagnosis not present

## 2022-01-15 DIAGNOSIS — Z794 Long term (current) use of insulin: Secondary | ICD-10-CM | POA: Diagnosis not present

## 2022-01-15 DIAGNOSIS — E1169 Type 2 diabetes mellitus with other specified complication: Secondary | ICD-10-CM | POA: Diagnosis not present

## 2022-01-15 DIAGNOSIS — Z008 Encounter for other general examination: Secondary | ICD-10-CM | POA: Diagnosis not present

## 2022-01-15 DIAGNOSIS — I10 Essential (primary) hypertension: Secondary | ICD-10-CM | POA: Diagnosis not present

## 2022-02-02 ENCOUNTER — Other Ambulatory Visit: Payer: Self-pay

## 2022-02-02 ENCOUNTER — Other Ambulatory Visit: Payer: Self-pay | Admitting: Pharmacist

## 2022-02-02 DIAGNOSIS — E039 Hypothyroidism, unspecified: Secondary | ICD-10-CM

## 2022-02-02 MED ORDER — LEVOTHYROXINE SODIUM 200 MCG PO TABS
200.0000 ug | ORAL_TABLET | Freq: Every day | ORAL | 1 refills | Status: DC
  Filled 2022-02-02: qty 30, 30d supply, fill #0
  Filled 2022-03-14: qty 30, 30d supply, fill #1

## 2022-02-05 ENCOUNTER — Other Ambulatory Visit: Payer: Self-pay

## 2022-03-14 ENCOUNTER — Ambulatory Visit: Payer: No Typology Code available for payment source | Attending: Family Medicine | Admitting: Family Medicine

## 2022-03-14 ENCOUNTER — Encounter: Payer: Self-pay | Admitting: Family Medicine

## 2022-03-14 ENCOUNTER — Other Ambulatory Visit: Payer: Self-pay

## 2022-03-14 VITALS — BP 136/85 | HR 86 | Ht 72.0 in | Wt 258.2 lb

## 2022-03-14 DIAGNOSIS — Z794 Long term (current) use of insulin: Secondary | ICD-10-CM | POA: Diagnosis not present

## 2022-03-14 DIAGNOSIS — I152 Hypertension secondary to endocrine disorders: Secondary | ICD-10-CM

## 2022-03-14 DIAGNOSIS — E1165 Type 2 diabetes mellitus with hyperglycemia: Secondary | ICD-10-CM | POA: Diagnosis not present

## 2022-03-14 DIAGNOSIS — E039 Hypothyroidism, unspecified: Secondary | ICD-10-CM | POA: Diagnosis not present

## 2022-03-14 DIAGNOSIS — M1A9XX0 Chronic gout, unspecified, without tophus (tophi): Secondary | ICD-10-CM | POA: Diagnosis not present

## 2022-03-14 DIAGNOSIS — E1159 Type 2 diabetes mellitus with other circulatory complications: Secondary | ICD-10-CM

## 2022-03-14 LAB — POCT GLYCOSYLATED HEMOGLOBIN (HGB A1C): HbA1c, POC (controlled diabetic range): 7.4 % — AB (ref 0.0–7.0)

## 2022-03-14 MED ORDER — ATORVASTATIN CALCIUM 40 MG PO TABS
40.0000 mg | ORAL_TABLET | Freq: Every day | ORAL | 1 refills | Status: DC
  Filled 2022-03-14 – 2022-04-12 (×2): qty 90, 90d supply, fill #0
  Filled 2022-09-06 (×2): qty 90, 90d supply, fill #1

## 2022-03-14 MED ORDER — AMLODIPINE BESYLATE 10 MG PO TABS
10.0000 mg | ORAL_TABLET | Freq: Every day | ORAL | 1 refills | Status: DC
  Filled 2022-03-14: qty 90, 90d supply, fill #0
  Filled 2022-09-06 (×2): qty 90, 90d supply, fill #1

## 2022-03-14 MED ORDER — EMPAGLIFLOZIN 25 MG PO TABS
25.0000 mg | ORAL_TABLET | Freq: Every day | ORAL | 1 refills | Status: DC
  Filled 2022-03-14 – 2022-07-10 (×2): qty 90, 90d supply, fill #0
  Filled 2022-11-27: qty 90, 90d supply, fill #1

## 2022-03-14 NOTE — Progress Notes (Signed)
Subjective:  Patient ID: Ray Garner, male    DOB: 1956-01-17  Age: 67 y.o. MRN: [id number]  CC: Diabetes   HPI Ray Garner is a 67 y.o. year old male with a history of ype 2 diabetes mellitus (A1c 7.4), hypertension, hypothyroidism, Gout here for follow-up visit.   Interval History:  A1c is 7.4 down from 8.3. He has has lost some weight-about 7 pounds since his last visit.  Endorses doing well on his Trulicity, Engineer, agricultural, [place], metformin.  Denies presence of visual concerns, neuropathy and did have a single episode of hypoglycemia in which his glucometer read low but he was asymptomatic. Endorses doing well on his statin and antihypertensive and is adherent with levothyroxine. He has not had any gout flares. Denies presence of additional concerns today. Past Medical History:  Diagnosis Date   Anemia    Arthritis    Back pain, lumbosacral 06/30/2013   Blood transfusion without reported diagnosis    Cataract    BEGINNING   DM type 2 (diabetes mellitus, type 2) ([place]) 06/30/2013   Gout    HTN (hypertension) 06/30/2013   Hypertension    Hypothyroid 06/30/2013    Past Surgical History:  Procedure Laterality Date   CYST EXCISION     FRACTURE SURGERY      Family History  Problem Relation Age of Onset   Breast cancer Niece    Colon cancer Neg Hx    Colon polyps Neg Hx    Esophageal cancer Neg Hx    Rectal cancer Neg Hx    Stomach cancer Neg Hx     Social History   Socioeconomic History   Marital status: Divorced    Spouse name: Not on file   Number of children: Not on file   Years of education: Not on file   Highest education level: Not on file  Occupational History   Not on file  Tobacco Use   Smoking status: Former    Types: Cigarettes    Quit date: 05/14/1983    Years since quitting: 38.8   Smokeless tobacco: Former  Scientific laboratory technician Use: Former  Substance and Sexual Activity   Alcohol use: Yes    Comment: OCC.   Drug use: No   Sexual activity: Not on  file  Other Topics Concern   Not on file  Social History Narrative   Not on file   Social Determinants of Health   Financial Resource Strain: Not on file  Food Insecurity: Not on file  Transportation Needs: Not on file  Physical Activity: Not on file  Stress: Not on file  Social Connections: Not on file    Allergies  Allergen Reactions   Vicodin [Hydrocodone-Acetaminophen] Hives and Rash    Outpatient Medications Prior to Visit  Medication Sig Dispense Refill   carvedilol (COREG) 6.25 MG tablet TAKE 1 TABLET (6.25 MG TOTAL) BY MOUTH 2 (TWO) TIMES DAILY WITH A MEAL. 60 tablet 6   Continuous Blood Gluc Receiver (FREESTYLE LIBRE 14 DAY READER) DEVI 1 each by Does not apply route 3 (three) times daily with meals. 1 Device 0   Continuous Blood Gluc Sensor (FREESTYLE LIBRE 14 DAY SENSOR) MISC 1 each by Does not apply route 3 (three) times daily with meals. 1 each 11   cyclobenzaprine (FLEXERIL) 5 MG tablet Take 1 tablet (5 mg total) by mouth 3 (three) times daily as needed. 10 tablet 0   Dulaglutide (TRULICITY) 3 DZ/3.2DJ SOPN Inject 3 mg as directed once a  week. 2 mL 2   Dulaglutide (TRULICITY) 3 NT/6.1WE SOPN Inject 3 mg as directed once a week. 2 mL 4   ergocalciferol (DRISDOL) 1.25 MG (50000 UT) capsule Take 1 capsule (50,000 Units total) by mouth once a week. 12 capsule 0   fluticasone (FLONASE) 50 MCG/ACT nasal spray Place 2 sprays into both nostrils daily. 16 g 1   gabapentin (NEURONTIN) 300 MG capsule Take 1 capsule (300 mg total) by mouth at bedtime. 30 capsule 6   glucose blood (BAYER CONTOUR TEST) test strip Use as instructed 100 each 12   hydrocortisone 1 % ointment Apply 1 application topically 2 (two) times daily. 30 g 1   ibuprofen (ADVIL) 800 MG tablet Take 1 tablet (800 mg total) by mouth 3 (three) times daily. 21 tablet 0   Insulin Glargine (BASAGLAR KWIKPEN) 100 UNIT/ML Inject 48 Units into the skin daily. 30 mL 3   Insulin Pen Needle (TRUEPLUS PEN NEEDLES) 31G X 6 MM  MISC USE AS DIRECTED. 100 each 5   levothyroxine (SYNTHROID) 200 MCG tablet Take 1 tablet (200 mcg total) by mouth daily before breakfast. 30 tablet 1   losartan (COZAAR) 100 MG tablet Take 1 tablet (100 mg total) by mouth daily. 30 tablet 6   meclizine (ANTIVERT) 25 MG tablet Take 1 tablet (25 mg total) by mouth 2 (two) times daily as needed for dizziness. 30 tablet 0   metFORMIN (GLUCOPHAGE) 500 MG tablet TAKE 2 TABLETS BY MOUTH TWICE DAILY WITH A MEAL 360 tablet 1   metoCLOPramide (REGLAN) 5 MG tablet Take 1 tablet (5 mg total) by mouth 3 (three) times daily before meals. 90 tablet 1   polyethylene glycol (MIRALAX / GLYCOLAX) 17 g packet Take 17 g by mouth daily. 14 each 0   Spacer/Aero-Holding Chambers (AEROCHAMBER PLUS) inhaler Use as instructed 1 each 2   vitamin B-12 (CYANOCOBALAMIN) 1000 MCG tablet Take 1,000 mcg by mouth daily.     atorvastatin (LIPITOR) 40 MG tablet TAKE 1 TABLET (40 MG TOTAL) BY MOUTH DAILY. 90 tablet 1   empagliflozin (JARDIANCE) 25 MG TABS tablet Take 1 tablet (25 mg total) by mouth daily before breakfast. 30 tablet 4   allopurinol (ZYLOPRIM) 300 MG tablet Take 1 tablet (300 mg total) by mouth daily. 30 tablet 3   amoxicillin (AMOXIL) 500 MG capsule Take 1 capsule (500 mg total) by mouth 3 (three) times daily. (Patient not taking: Reported on 10/12/2021) 30 capsule 0   omeprazole (PRILOSEC) 20 MG capsule TAKE 1 CAPSULE (20 MG TOTAL) BY MOUTH DAILY. 90 capsule 1   amLODipine (NORVASC) 10 MG tablet Take 1 tablet (10 mg total) by mouth daily. 30 tablet 3   No facility-administered medications prior to visit.     ROS Review of Systems  Constitutional:  Negative for activity change and appetite change.  HENT:  Negative for sinus pressure and sore throat.   Respiratory:  Negative for chest tightness, shortness of breath and wheezing.   Cardiovascular:  Negative for chest pain and palpitations.  Gastrointestinal:  Negative for abdominal distention, abdominal pain and  constipation.  Genitourinary: Negative.   Musculoskeletal: Negative.   Psychiatric/Behavioral:  Negative for behavioral problems and dysphoric mood.     Objective:  BP 136/85   Pulse 86   Ht 6' (1.829 m)   Wt 258 lb 3.2 oz (117.1 kg)   SpO2 97%   BMI 35.02 kg/m      03/14/2022    3:57 PM 12/06/2021    3:39  PM 10/12/2021   10:56 AM  BP/Weight  Systolic BP 660 630 160  Diastolic BP 85 81 77  Wt. (Lbs) 258.2 265.2 263  BMI 35.02 kg/m2 35.97 kg/m2 35.67 kg/m2      Physical Exam Constitutional:      Appearance: He is well-developed.  Cardiovascular:     Rate and Rhythm: Normal rate.     Heart sounds: Normal heart sounds. No murmur heard. Pulmonary:     Effort: Pulmonary effort is normal.     Breath sounds: Normal breath sounds. No wheezing or rales.  Chest:     Chest wall: No tenderness.  Abdominal:     General: Bowel sounds are normal. There is no distension.     Palpations: Abdomen is soft. There is no mass.     Tenderness: There is no abdominal tenderness.  Musculoskeletal:        General: Normal range of motion.     Right lower leg: No edema.     Left lower leg: No edema.  Neurological:     Mental Status: He is alert and oriented to person, place, and time.  Psychiatric:        Mood and Affect: Mood normal.        Latest Ref Rng & Units 09/14/2021    6:02 PM 09/11/2020    1:43 PM 12/16/2019   11:40 AM  CMP  Glucose 70 - 99 mg/dL 157  175  111   BUN 8 - 23 mg/dL 34  10  7   Creatinine 0.61 - 1.24 mg/dL 1.64  0.90  0.79   Sodium 135 - 145 mmol/L 136  136  142   Potassium 3.5 - 5.1 mmol/L 4.1  4.6  3.9   Chloride 98 - 111 mmol/L 103  102  104   CO2 22 - 32 mmol/L '24  28  23   '$ Calcium 8.9 - 10.3 mg/dL 9.4  9.4  9.3   Total Protein 6.5 - 8.1 g/dL  8.1  7.4   Total Bilirubin 0.3 - 1.2 mg/dL  0.7  0.5   Alkaline Phos 38 - 126 U/L  76  102   AST 15 - 41 U/L  21  19   ALT 0 - 44 U/L  21  16     Lipid Panel     Component Value Date/Time   CHOL 110  12/16/2019 1140   TRIG 92 12/16/2019 1140   HDL 32 (L) 12/16/2019 1140   CHOLHDL 3.4 12/16/2019 1140   CHOLHDL 3.6 08/08/2015 0845   VLDL 34 (H) 08/08/2015 0845   LDLCALC 60 12/16/2019 1140    CBC    Component Value Date/Time   WBC 14.1 (H) 09/14/2021 1802   RBC 5.21 09/14/2021 1802   HGB 14.3 09/14/2021 1802   HGB 13.2 08/28/2018 1507   HCT 43.9 09/14/2021 1802   HCT 39.0 08/28/2018 1507   PLT 351 09/14/2021 1802   PLT 274 08/28/2018 1507   MCV 84.3 09/14/2021 1802   MCV 84 08/28/2018 1507   MCH 27.4 09/14/2021 1802   MCHC 32.6 09/14/2021 1802   RDW 13.6 09/14/2021 1802   RDW 13.4 08/28/2018 1507   LYMPHSABS 3.5 09/14/2021 1802   LYMPHSABS 3.6 (H) 08/28/2018 1507   MONOABS 1.0 09/14/2021 1802   EOSABS 0.2 09/14/2021 1802   EOSABS 0.2 08/28/2018 1507   BASOSABS 0.1 09/14/2021 1802   BASOSABS 0.0 08/28/2018 1507    Lab Results  Component Value Date   HGBA1C  7.4 (A) 03/14/2022    Lab Results  Component Value Date   TSH 3.270 10/12/2021    Assessment & Plan:  1. Type 2 diabetes mellitus with hyperglycemia, with long-term current use of insulin (HCC) Controlled with A1c of 7.4 Continue current regimen Advised that if he has hypoglycemia he can decrease Basaglar by 2 units Counseled on Diabetic diet, my plate method, 169 minutes of moderate intensity exercise/week Blood sugar logs with fasting goals of 80-120 mg/dl, random of less than 180 and in the event of sugars less than 60 mg/dl or greater than 400 mg/dl encouraged to notify the clinic. Advised on the need for annual eye exams, annual foot exams, Pneumonia vaccine. - POCT glycosylated hemoglobin (Hb A1C) - Microalbumin / creatinine urine ratio; Future - LP+Non-HDL Cholesterol; Future - CMP14+EGFR; Future - atorvastatin (LIPITOR) 40 MG tablet; Take 1 tablet (40 mg total) by mouth daily.  Dispense: 90 tablet; Refill: 1 - empagliflozin (JARDIANCE) 25 MG TABS tablet; Take 1 tablet (25 mg total) by mouth daily  before breakfast.  Dispense: 90 tablet; Refill: 1  2. Acquired hypothyroidism Normal Continue levothyroxine - T4, free; Future - TSH; Future - T3; Future  3. Hypertension associated with diabetes ([place]) Stable Continue current regimen Counseled on blood pressure goal of less than 130/80, low-sodium, DASH diet, medication compliance, 150 minutes of moderate intensity exercise per week. Discussed medication compliance, adverse effects. - amLODipine (NORVASC) 10 MG tablet; Take 1 tablet (10 mg total) by mouth daily.  Dispense: 90 tablet; Refill: 1  4. Chronic gout without tophus, unspecified cause, unspecified site Controlled with no acute flares   Health Care Maintenance: Counseled on need for RSV but he is undecided about this. Meds ordered this encounter  Medications   amLODipine (NORVASC) 10 MG tablet    Sig: Take 1 tablet (10 mg total) by mouth daily.    Dispense:  90 tablet    Refill:  1   atorvastatin (LIPITOR) 40 MG tablet    Sig: Take 1 tablet (40 mg total) by mouth daily.    Dispense:  90 tablet    Refill:  1   empagliflozin (JARDIANCE) 25 MG TABS tablet    Sig: Take 1 tablet (25 mg total) by mouth daily before breakfast.    Dispense:  90 tablet    Refill:  1    Follow-up: Return in about 6 months (around 09/12/2022) for Chronic medical conditions.       Charlott Rakes, MD, FAAFP. [facility], [place]   03/14/2022, 4:23 PM

## 2022-03-14 NOTE — Patient Instructions (Signed)

## 2022-03-16 ENCOUNTER — Ambulatory Visit: Payer: No Typology Code available for payment source | Attending: Family Medicine

## 2022-03-16 DIAGNOSIS — E1165 Type 2 diabetes mellitus with hyperglycemia: Secondary | ICD-10-CM

## 2022-03-16 DIAGNOSIS — E039 Hypothyroidism, unspecified: Secondary | ICD-10-CM | POA: Diagnosis not present

## 2022-03-16 DIAGNOSIS — Z794 Long term (current) use of insulin: Secondary | ICD-10-CM | POA: Diagnosis not present

## 2022-03-18 LAB — CMP14+EGFR
ALT: 19 IU/L (ref 0–44)
AST: 16 IU/L (ref 0–40)
Albumin/Globulin Ratio: 1.3 (ref 1.2–2.2)
Albumin: 4 g/dL (ref 3.9–4.9)
Alkaline Phosphatase: 109 IU/L (ref 44–121)
BUN/Creatinine Ratio: 11 (ref 10–24)
BUN: 11 mg/dL (ref 8–27)
Bilirubin Total: 0.2 mg/dL (ref 0.0–1.2)
CO2: 23 mmol/L (ref 20–29)
Calcium: 9.2 mg/dL (ref 8.6–10.2)
Chloride: 106 mmol/L (ref 96–106)
Creatinine, Ser: 0.99 mg/dL (ref 0.76–1.27)
Globulin, Total: 3 g/dL (ref 1.5–4.5)
Glucose: 167 mg/dL — ABNORMAL HIGH (ref 70–99)
Potassium: 4.6 mmol/L (ref 3.5–5.2)
Sodium: 142 mmol/L (ref 134–144)
Total Protein: 7 g/dL (ref 6.0–8.5)
eGFR: 85 mL/min/{1.73_m2} (ref >59)

## 2022-03-18 LAB — T4, FREE: Free T4: 1.15 ng/dL (ref 0.82–1.77)

## 2022-03-18 LAB — T3: T3, Total: 85 ng/dL (ref 71–180)

## 2022-03-18 LAB — LP+NON-HDL CHOLESTEROL
Cholesterol, Total: 102 mg/dL (ref 100–199)
HDL: 31 mg/dL — ABNORMAL LOW (ref >39)
LDL Chol Calc (NIH): 46 mg/dL (ref 0–99)
Total Non-HDL-Chol (LDL+VLDL): 71 mg/dL (ref 0–129)
Triglycerides: 145 mg/dL (ref 0–149)
VLDL Cholesterol Cal: 25 mg/dL (ref 5–40)

## 2022-03-18 LAB — MICROALBUMIN / CREATININE URINE RATIO
Creatinine, Urine: 137.9 mg/dL
Microalb/Creat Ratio: 4 mg/g creat (ref 0–29)
Microalbumin, Urine: 5.2 ug/mL

## 2022-03-18 LAB — TSH: TSH: 0.403 u[IU]/mL — ABNORMAL LOW (ref 0.450–4.500)

## 2022-03-19 ENCOUNTER — Other Ambulatory Visit: Payer: Self-pay | Admitting: Family Medicine

## 2022-03-19 DIAGNOSIS — E039 Hypothyroidism, unspecified: Secondary | ICD-10-CM

## 2022-03-21 ENCOUNTER — Other Ambulatory Visit: Payer: Self-pay | Admitting: Family Medicine

## 2022-03-21 ENCOUNTER — Other Ambulatory Visit: Payer: Self-pay

## 2022-03-21 DIAGNOSIS — E039 Hypothyroidism, unspecified: Secondary | ICD-10-CM

## 2022-03-21 MED ORDER — LEVOTHYROXINE SODIUM 175 MCG PO TABS
175.0000 ug | ORAL_TABLET | Freq: Every day | ORAL | 1 refills | Status: DC
  Filled 2022-03-21: qty 90, 90d supply, fill #0

## 2022-03-27 ENCOUNTER — Other Ambulatory Visit: Payer: Self-pay

## 2022-04-06 ENCOUNTER — Other Ambulatory Visit: Payer: Self-pay | Admitting: Family Medicine

## 2022-04-06 ENCOUNTER — Other Ambulatory Visit: Payer: Self-pay

## 2022-04-06 DIAGNOSIS — E1165 Type 2 diabetes mellitus with hyperglycemia: Secondary | ICD-10-CM

## 2022-04-06 MED ORDER — TRULICITY 3 MG/0.5ML ~~LOC~~ SOAJ
3.0000 mg | SUBCUTANEOUS | 1 refills | Status: DC
  Filled 2022-04-06 – 2022-04-12 (×2): qty 6, 84d supply, fill #0
  Filled 2022-07-10 – 2022-07-27 (×2): qty 2, 28d supply, fill #1
  Filled 2022-09-06 (×2): qty 2, 28d supply, fill #2

## 2022-04-12 ENCOUNTER — Other Ambulatory Visit: Payer: Self-pay

## 2022-04-16 ENCOUNTER — Other Ambulatory Visit: Payer: Self-pay

## 2022-05-11 ENCOUNTER — Other Ambulatory Visit: Payer: Self-pay | Admitting: Family Medicine

## 2022-05-11 ENCOUNTER — Other Ambulatory Visit: Payer: Self-pay

## 2022-05-11 DIAGNOSIS — E039 Hypothyroidism, unspecified: Secondary | ICD-10-CM

## 2022-05-11 MED ORDER — LEVOTHYROXINE SODIUM 200 MCG PO TABS
200.0000 ug | ORAL_TABLET | Freq: Every day | ORAL | 1 refills | Status: DC
  Filled 2022-05-11: qty 30, 30d supply, fill #0
  Filled 2022-07-10: qty 30, 30d supply, fill #1

## 2022-05-17 ENCOUNTER — Other Ambulatory Visit: Payer: Self-pay

## 2022-05-30 ENCOUNTER — Telehealth: Payer: No Typology Code available for payment source | Admitting: Family Medicine

## 2022-05-31 ENCOUNTER — Telehealth: Payer: No Typology Code available for payment source | Admitting: Family Medicine

## 2022-06-04 ENCOUNTER — Ambulatory Visit: Payer: Medicare PPO | Attending: Family Medicine | Admitting: Family Medicine

## 2022-06-04 ENCOUNTER — Encounter: Payer: Self-pay | Admitting: Family Medicine

## 2022-06-04 DIAGNOSIS — E1165 Type 2 diabetes mellitus with hyperglycemia: Secondary | ICD-10-CM | POA: Diagnosis not present

## 2022-06-04 DIAGNOSIS — Z794 Long term (current) use of insulin: Secondary | ICD-10-CM | POA: Diagnosis not present

## 2022-06-04 DIAGNOSIS — Z029 Encounter for administrative examinations, unspecified: Secondary | ICD-10-CM

## 2022-06-04 NOTE — Progress Notes (Signed)
Virtual Visit via Telephone Note  I connected with Ray Garner, on 06/04/2022 at 8:28 AM by telephone and verified that I am speaking with the correct person using two identifiers.   Consent: I discussed the limitations, risks, security and privacy concerns of performing an evaluation and management service by telephone and the availability of in person appointments. I also discussed with the patient that there may be a patient responsible charge related to this service. The patient expressed understanding and agreed to proceed.   Location of Patient: Home  Location of Provider: Clinic   Persons participating in Telemedicine visit: Ray Garner Dr. Alvis Lemmings     History of Present Illness: Ray Garner is a 67 y.o. year old male with a history of ype 2 diabetes mellitus (A1c 7.4), hypertension, hypothyroidism, Gout. 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   2 months ago he had a 'low' glucose after he had skipped his meal and had to take "sugar pill" but other than that his blood sugars are doing well.  He has a copy of his blood sugar log which reveals fasting sugars in the low 100s.  He has had no hypoglycemic episodes requiring the assistance of someone else, no seizures. His next chronic disease management appointment does not come up until 3 months. Past Medical History:  Diagnosis Date   Anemia    Arthritis    Back pain, lumbosacral 06/30/2013   Blood transfusion without reported diagnosis    Cataract    BEGINNING   DM type 2 (diabetes mellitus, type 2) (HCC) 06/30/2013   Gout    HTN (hypertension) 06/30/2013   Hypertension    Hypothyroid 06/30/2013   Allergies  Allergen Reactions   Vicodin [Hydrocodone-Acetaminophen] Hives and Rash    Current Outpatient Medications on File Prior to Visit  Medication Sig Dispense Refill   allopurinol (ZYLOPRIM) 300 MG tablet Take 1 tablet (300 mg total) by mouth daily. 30 tablet 3   amLODipine (NORVASC)  10 MG tablet Take 1 tablet (10 mg total) by mouth daily. 90 tablet 1   amoxicillin (AMOXIL) 500 MG capsule Take 1 capsule (500 mg total) by mouth 3 (three) times daily. (Patient not taking: Reported on 10/12/2021) 30 capsule 0   atorvastatin (LIPITOR) 40 MG tablet Take 1 tablet (40 mg total) by mouth daily. 90 tablet 1   carvedilol (COREG) 6.25 MG tablet TAKE 1 TABLET (6.25 MG TOTAL) BY MOUTH 2 (TWO) TIMES DAILY WITH A MEAL. 60 tablet 6   Continuous Blood Gluc Receiver (FREESTYLE LIBRE 14 DAY READER) DEVI 1 each by Does not apply route 3 (three) times daily with meals. 1 Device 0   Continuous Blood Gluc Sensor (FREESTYLE LIBRE 14 DAY SENSOR) MISC 1 each by Does not apply route 3 (three) times daily with meals. 1 each 11   cyclobenzaprine (FLEXERIL) 5 MG tablet Take 1 tablet (5 mg total) by mouth 3 (three) times daily as needed. 10 tablet 0   Dulaglutide (TRULICITY) 3 MG/0.5ML SOPN Inject 3 mg as directed once a week. 6 mL 1   empagliflozin (JARDIANCE) 25 MG TABS tablet Take 1 tablet (25 mg total) by mouth daily before breakfast. 90 tablet 1   ergocalciferol (DRISDOL) 1.25 MG (50000 UT) capsule Take 1 capsule (50,000 Units total) by mouth once a week. 12 capsule 0   fluticasone (FLONASE) 50 MCG/ACT nasal spray Place 2 sprays into both nostrils daily. 16 g 1   gabapentin (NEURONTIN) 300 MG capsule Take 1  capsule (300 mg total) by mouth at bedtime. 30 capsule 6   glucose blood (BAYER CONTOUR TEST) test strip Use as instructed 100 each 12   hydrocortisone 1 % ointment Apply 1 application topically 2 (two) times daily. 30 g 1   ibuprofen (ADVIL) 800 MG tablet Take 1 tablet (800 mg total) by mouth 3 (three) times daily. 21 tablet 0   Insulin Glargine (BASAGLAR KWIKPEN) 100 UNIT/ML Inject 48 Units into the skin daily. 30 mL 3   Insulin Pen Needle (TRUEPLUS PEN NEEDLES) 31G X 6 MM MISC USE AS DIRECTED. 100 each 5   levothyroxine (SYNTHROID) 175 MCG tablet Take 1 tablet (175 mcg total) by mouth daily before  breakfast. 90 tablet 1   levothyroxine (SYNTHROID) 200 MCG tablet Take 1 tablet (200 mcg total) by mouth daily before breakfast. 30 tablet 1   losartan (COZAAR) 100 MG tablet Take 1 tablet (100 mg total) by mouth daily. 30 tablet 6   meclizine (ANTIVERT) 25 MG tablet Take 1 tablet (25 mg total) by mouth 2 (two) times daily as needed for dizziness. 30 tablet 0   metFORMIN (GLUCOPHAGE) 500 MG tablet TAKE 2 TABLETS BY MOUTH TWICE DAILY WITH A MEAL 360 tablet 1   metoCLOPramide (REGLAN) 5 MG tablet Take 1 tablet (5 mg total) by mouth 3 (three) times daily before meals. 90 tablet 1   omeprazole (PRILOSEC) 20 MG capsule TAKE 1 CAPSULE (20 MG TOTAL) BY MOUTH DAILY. 90 capsule 1   polyethylene glycol (MIRALAX / GLYCOLAX) 17 g packet Take 17 g by mouth daily. 14 each 0   Spacer/Aero-Holding Chambers (AEROCHAMBER PLUS) inhaler Use as instructed 1 each 2   vitamin B-12 (CYANOCOBALAMIN) 1000 MCG tablet Take 1,000 mcg by mouth daily.     No current facility-administered medications on file prior to visit.    ROS: See HPI  Observations/Objective: Awake, alert, oriented x3 Not in acute distress Normal mood      Latest Ref Rng & Units 03/16/2022    8:36 AM 09/14/2021    6:02 PM 09/11/2020    1:43 PM  CMP  Glucose 70 - 99 mg/dL 158  309  407   BUN 8 - 27 mg/dL 11  34  10   Creatinine 0.76 - 1.27 mg/dL 6.80  8.81  1.03   Sodium 134 - 144 mmol/L 142  136  136   Potassium 3.5 - 5.2 mmol/L 4.6  4.1  4.6   Chloride 96 - 106 mmol/L 106  103  102   CO2 20 - 29 mmol/L 23  24  28    Calcium 8.6 - 10.2 mg/dL 9.2  9.4  9.4   Total Protein 6.0 - 8.5 g/dL 7.0   8.1   Total Bilirubin 0.0 - 1.2 mg/dL 0.2   0.7   Alkaline Phos 44 - 121 IU/L 109   76   AST 0 - 40 IU/L 16   21   ALT 0 - 44 IU/L 19   21     Lipid Panel     Component Value Date/Time   CHOL 102 03/16/2022 0836   TRIG 145 03/16/2022 0836   HDL 31 (L) 03/16/2022 0836   CHOLHDL 3.4 12/16/2019 1140   CHOLHDL 3.6 08/08/2015 0845   VLDL 34 (H)  08/08/2015 0845   LDLCALC 46 03/16/2022 0836   LABVLDL 25 03/16/2022 0836    Lab Results  Component Value Date   HGBA1C 7.4 (A) 03/14/2022    Assessment and Plan: 1. Type  2 diabetes mellitus with hyperglycemia, with long-term current use of insulin Controlled with A1c of 7.4 Continue current regimen Keep appointment for chronic disease management Counseled on Diabetic diet, my plate method, [id number] minutes of moderate intensity exercise/week Blood sugar logs with fasting goals of 80-120 mg/dl, random of less than 096180 and in the event of sugars less than 60 mg/dl or greater than 045400 mg/dl encouraged to notify the clinic. Advised on the need for annual eye exams, annual foot exams, Pneumonia vaccine.   2. 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   I discussed the assessment and treatment plan with the patient. The patient was provided an opportunity to ask questions and all were answered. The patient agreed with the plan and demonstrated an understanding of the instructions.   The patient was advised to call back or seek an in-person evaluation if the symptoms worsen or if the condition fails to improve as anticipated.     I provided 12 minutes total of non-face-to-face time during this encounter.   Hoy RegisterEnobong Leobardo Granlund, MD, FAAFP. [facility] [place], [place] [phone]   06/04/2022, 8:28 AM

## 2022-07-10 ENCOUNTER — Other Ambulatory Visit: Payer: Self-pay

## 2022-07-10 ENCOUNTER — Other Ambulatory Visit: Payer: Self-pay | Admitting: Pharmacist

## 2022-07-10 MED ORDER — LANTUS SOLOSTAR 100 UNIT/ML ~~LOC~~ SOPN
48.0000 [IU] | PEN_INJECTOR | Freq: Every day | SUBCUTANEOUS | 2 refills | Status: DC
  Filled 2022-07-10 – 2022-08-06 (×2): qty 15, 31d supply, fill #0
  Filled 2022-09-06 (×2): qty 15, 31d supply, fill #1

## 2022-07-17 ENCOUNTER — Other Ambulatory Visit: Payer: Self-pay

## 2022-07-27 ENCOUNTER — Other Ambulatory Visit: Payer: Self-pay

## 2022-07-30 ENCOUNTER — Other Ambulatory Visit: Payer: Self-pay

## 2022-08-01 ENCOUNTER — Other Ambulatory Visit: Payer: Self-pay

## 2022-08-02 ENCOUNTER — Other Ambulatory Visit: Payer: Self-pay

## 2022-08-06 ENCOUNTER — Other Ambulatory Visit: Payer: Self-pay

## 2022-08-09 ENCOUNTER — Other Ambulatory Visit: Payer: Self-pay

## 2022-08-31 ENCOUNTER — Other Ambulatory Visit: Payer: Self-pay

## 2022-09-04 ENCOUNTER — Other Ambulatory Visit: Payer: Self-pay | Admitting: Family Medicine

## 2022-09-04 ENCOUNTER — Other Ambulatory Visit: Payer: Self-pay

## 2022-09-04 DIAGNOSIS — E039 Hypothyroidism, unspecified: Secondary | ICD-10-CM

## 2022-09-06 ENCOUNTER — Other Ambulatory Visit: Payer: Self-pay

## 2022-09-13 ENCOUNTER — Other Ambulatory Visit: Payer: Self-pay | Admitting: Family Medicine

## 2022-09-13 DIAGNOSIS — E039 Hypothyroidism, unspecified: Secondary | ICD-10-CM

## 2022-09-13 NOTE — Telephone Encounter (Signed)
Medication Refill - Medication: levothyroxine (SYNTHROID) 200 MCG tablet   Has the patient contacted their pharmacy? No. \ Preferred Pharmacy (with phone number or street name):    Has the patient been seen for an appointment in the last year [facility]  [address] [place] [address]  Phone: [phone] Fax: [phone]  Hours: M-F 7:30a-6:00p  R does the patient have an upcoming appointment? Yes.    Agent: Please be advised that RX refills may take up to 3 business days. We ask that you follow-up with your pharmacy.

## 2022-09-14 NOTE — Telephone Encounter (Signed)
Requested Prescriptions  Refused Prescriptions Disp Refills   levothyroxine (SYNTHROID) 200 MCG tablet 30 tablet 1    Sig: Take 1 tablet (200 mcg total) by mouth daily before breakfast.     Endocrinology:  Hypothyroid Agents Failed - 09/13/2022  2:08 PM      Failed - TSH in normal range and within 360 days    TSH  Date Value Ref Range Status  03/16/2022 0.403 (L) 0.450 - 4.500 uIU/mL Final         Passed - Valid encounter within last 12 months    Recent Outpatient Visits           3 months ago Type 2 diabetes mellitus with hyperglycemia, with long-term current use of insulin (HCC)   [place] [facility] [place], [place], [place]   6 months ago Type 2 diabetes mellitus with hyperglycemia, with long-term current use of insulin (HCC)   [place] [facility] [place], Odette Horns, MD   9 months ago Lower urinary tract symptoms   [place] [facility] [place], Odette Horns, MD   10 months ago Type 2 diabetes mellitus with hyperglycemia, with long-term current use of insulin [facility])   [place] [facility] [place], [place] L, RPH-CPP   11 months ago Type 2 diabetes mellitus with hyperglycemia, with long-term current use of insulin [facility])   [place] [facility] [place], Marzella Schlein, [place]       Future Appointments             In 2 months Hoy Register, MD [facility]

## 2022-09-17 ENCOUNTER — Ambulatory Visit: Payer: Self-pay | Admitting: Family Medicine

## 2022-09-26 ENCOUNTER — Other Ambulatory Visit: Payer: Self-pay | Admitting: Family Medicine

## 2022-09-26 DIAGNOSIS — M1A9XX0 Chronic gout, unspecified, without tophus (tophi): Secondary | ICD-10-CM

## 2022-09-26 DIAGNOSIS — E039 Hypothyroidism, unspecified: Secondary | ICD-10-CM

## 2022-09-26 NOTE — Telephone Encounter (Signed)
Medication Refill - Medication: levothyroxine (SYNTHROID) 200 MCG tablet, levothyroxine (SYNTHROID) 175 MCG tablet and allopurinol (ZYLOPRIM) 300 MG tablet    Has the patient contacted their pharmacy? No.  Preferred Pharmacy (with phone number or street name):  [facility] Phone: [phone]  Fax: [phone]     Has the patient been seen for an appointment in the last year OR does the patient have an upcoming appointment? Yes.    Agent: Please be advised that RX refills may take up to 3 business days. We ask that you follow-up with your pharmacy.

## 2022-09-27 MED ORDER — ALLOPURINOL 300 MG PO TABS
300.0000 mg | ORAL_TABLET | Freq: Every day | ORAL | 1 refills | Status: DC
Start: 2022-09-27 — End: 2023-04-29
  Filled 2022-09-27 – 2022-10-10 (×3): qty 90, 90d supply, fill #0
  Filled 2023-01-02: qty 90, 90d supply, fill #1

## 2022-09-27 MED ORDER — LEVOTHYROXINE SODIUM 175 MCG PO TABS
175.0000 ug | ORAL_TABLET | Freq: Every day | ORAL | 1 refills | Status: DC
  Filled 2022-09-27: qty 90, 90d supply, fill #0

## 2022-09-27 NOTE — Telephone Encounter (Signed)
Requested medication (s) are due for refill today:   Requested medication (s) are on the active medication list: Synthroid doses still on medication list.  Last refill:    Future visit scheduled: Yes  Notes to clinic:  See request.    Requested Prescriptions  Pending Prescriptions Disp Refills   levothyroxine (SYNTHROID) 175 MCG tablet 90 tablet 1    Sig: Take 1 tablet (175 mcg total) by mouth daily before breakfast.     Endocrinology:  Hypothyroid Agents Failed - 09/26/2022  4:03 PM      Failed - TSH in normal range and within 360 days    TSH  Date Value Ref Range Status  03/16/2022 0.403 (L) 0.450 - 4.500 uIU/mL Final         Passed - Valid encounter within last 12 months    Recent Outpatient Visits           3 months ago Type 2 diabetes mellitus with hyperglycemia, with long-term current use of insulin (HCC)   [place] [facility] [place], [place], [place]   6 months ago Type 2 diabetes mellitus with hyperglycemia, with long-term current use of insulin (HCC)   [facility] [place], Odette Horns, MD   9 months ago Lower urinary tract symptoms   [facility] [place], [place], MD   10 months ago Type 2 diabetes mellitus with hyperglycemia, with long-term current use of insulin [facility])   [place] [facility] [place], [place] L, RPH-CPP   11 months ago Type 2 diabetes mellitus with hyperglycemia, with long-term current use of insulin [facility])   [place] [facility] [place], [place], [place]       Future Appointments             In 2 months Alvis Lemmings, Odette Horns, MD [facility]             levothyroxine (SYNTHROID) 200 MCG tablet 30 tablet 1    Sig: Take 1 tablet (200 mcg total) by mouth daily before breakfast.     Endocrinology:  Hypothyroid Agents Failed - 09/26/2022  4:03 PM      Failed - TSH in normal  range and within 360 days    TSH  Date Value Ref Range Status  03/16/2022 0.403 (L) 0.450 - 4.500 uIU/mL Final         Passed - Valid encounter within last 12 months    Recent Outpatient Visits           3 months ago Type 2 diabetes mellitus with hyperglycemia, with long-term current use of insulin (HCC)   [place] [facility] [place], [place], [place]   6 months ago Type 2 diabetes mellitus with hyperglycemia, with long-term current use of insulin (HCC)   [place] [facility] [place], Odette Horns, MD   9 months ago Lower urinary tract symptoms   [place] [facility] [place], Odette Horns, MD   10 months ago Type 2 diabetes mellitus with hyperglycemia, with long-term current use of insulin [facility])   [place] [facility] [place], [place] L, RPH-CPP   11 months ago Type 2 diabetes mellitus with hyperglycemia, with long-term current use of insulin [facility])   [place] [facility] [place], Marzella Schlein, [place]       Future Appointments  In 2 months Hoy Register, MD [facility]             allopurinol (ZYLOPRIM) 300 MG tablet 30 tablet 3    Sig: Take 1 tablet (300 mg total) by mouth daily.     Endocrinology:  Gout Agents - allopurinol Failed - 09/26/2022  4:03 PM      Failed - Uric Acid in normal range and within 360 days    Uric Acid, Serum  Date Value Ref Range Status  07/08/2017 5.0 4.0 - 7.8 mg/dL Final         Failed - CBC within normal limits and completed in the last 12 months    WBC  Date Value Ref Range Status  09/14/2021 14.1 (H) 4.0 - 10.5 K/uL Final   RBC  Date Value Ref Range Status  09/14/2021 5.21 4.22 - 5.81 MIL/uL Final   Hemoglobin  Date Value Ref Range Status  09/14/2021 14.3 13.0 - 17.0 g/dL Final  16/11/9602 54.0 13.0 - 17.7 g/dL Final   HCT  Date Value Ref Range Status  09/14/2021 43.9 39.0  - 52.0 % Final   Hematocrit  Date Value Ref Range Status  08/28/2018 39.0 37.5 - 51.0 % Final   MCHC  Date Value Ref Range Status  09/14/2021 32.6 30.0 - 36.0 g/dL Final   [facility]  Date Value Ref Range Status  09/14/2021 27.4 26.0 - 34.0 pg Final   MCV  Date Value Ref Range Status  09/14/2021 84.3 80.0 - 100.0 fL Final  08/28/2018 84 79 - 97 fL Final   No results found for: "PLTCOUNTKUC", "LABPLAT", "POCPLA" RDW  Date Value Ref Range Status  09/14/2021 13.6 11.5 - 15.5 % Final  08/28/2018 13.4 11.6 - 15.4 % Final         Passed - Cr in normal range and within 360 days    Creat  Date Value Ref Range Status  04/11/2016 0.89 0.70 - 1.33 mg/dL Final    Comment:      For patients > or = 66 years of age: The upper reference limit for Creatinine is approximately 13% higher for people identified as African-American.      Creatinine, Ser  Date Value Ref Range Status  03/16/2022 0.99 0.76 - 1.27 mg/dL Final   Creatinine,U  Date Value Ref Range Status  07/08/2017 324.4 mg/dL Final   Creatinine, Urine  Date Value Ref Range Status  08/08/2015 77 20 - 370 mg/dL Final         Passed - Valid encounter within last 12 months    Recent Outpatient Visits           3 months ago Type 2 diabetes mellitus with hyperglycemia, with long-term current use of insulin (HCC)   [place] [facility] [employer], [place], MD   6 months ago Type 2 diabetes mellitus with hyperglycemia, with long-term current use of insulin (HCC)   [place] [facility] [place], Odette Horns, MD   9 months ago Lower urinary tract symptoms   [place] [facility] [place], Odette Horns, MD   10 months ago Type 2 diabetes mellitus with hyperglycemia, with long-term current use of insulin [facility])   [place] [facility] [place], [place] L, RPH-CPP   11 months ago Type 2 diabetes mellitus with hyperglycemia, with long-term current  use of insulin [facility])    [facility] & [address] [facility] [place],  Marzella Schlein, PA-C       Future Appointments             In 2 months Hoy Register, MD [facility]

## 2022-09-28 ENCOUNTER — Other Ambulatory Visit: Payer: Self-pay

## 2022-10-01 ENCOUNTER — Other Ambulatory Visit: Payer: Self-pay

## 2022-10-02 NOTE — Patient Instructions (Incomplete)
Mr. Ray Garner , Thank you for taking time to come for your Medicare Wellness Visit. I appreciate your ongoing commitment to your health goals. Please review the following plan we discussed and let me know if I can assist you in the future.   Referrals/Orders/Follow-Ups/Clinician Recommendations: Aim for 30 minutes of exercise or brisk walking, 6-8 glasses of water, and 5 servings of fruits and vegetables each day.   This is a list of the screening recommended for you and due dates:  Health Maintenance  Topic Date Due   Zoster (Shingles) Vaccine (1 of 2) Never done   Eye exam for diabetics  10/07/2019   Complete foot exam   09/14/2020   COVID-19 Vaccine (3 - 2023-24 season) 10/27/2021   Hemoglobin A1C  09/12/2022   Flu Shot  09/27/2022   Yearly kidney function blood test for diabetes  03/17/2023   Yearly kidney health urinalysis for diabetes  03/17/2023   Medicare Annual Wellness Visit  10/03/2023   Colon Cancer Screening  10/16/2023   DTaP/Tdap/Td vaccine (2 - Td or Tdap) 04/27/2028   Pneumonia Vaccine  Completed   Hepatitis C Screening  Completed   HIV Screening  Completed   HPV Vaccine  Aged Out    Advanced directives: (ACP Link)Information on Advanced Care Planning can be found at [facility] [place] Advance Health Care Directives Advance Health Care Directives ([email or web address]   Next Medicare Annual Wellness Visit scheduled for next year: Yes  Preventive Care 65 Years and Older, Male  Preventive care refers to lifestyle choices and visits with your health care provider that can promote health and wellness. What does preventive care include? A yearly physical exam. This is also called an annual well check. Dental exams once or twice a year. Routine eye exams. Ask your health care provider how often you should have your eyes checked. Personal lifestyle choices, including: Daily care of your teeth and gums. Regular physical activity. Eating a healthy diet. Avoiding  tobacco and drug use. Limiting alcohol use. Practicing safe sex. Taking low doses of aspirin every day. Taking vitamin and mineral supplements as recommended by your health care provider. What happens during an annual well check? The services and screenings done by your health care provider during your annual well check will depend on your age, overall health, lifestyle risk factors, and family history of disease. Counseling  Your health care provider may ask you questions about your: Alcohol use. Tobacco use. Drug use. Emotional well-being. Home and relationship well-being. Sexual activity. Eating habits. History of falls. Memory and ability to understand (cognition). Work and work Astronomer. Screening  You may have the following tests or measurements: Height, weight, and BMI. Blood pressure. Lipid and cholesterol levels. These may be checked every 5 years, or more frequently if you are over 16 years old. Skin check. Lung cancer screening. You may have this screening every year starting at age 68 if you have a 30-pack-year history of smoking and currently smoke or have quit within the past 15 years. Fecal occult blood test (FOBT) of the stool. You may have this test every year starting at age 67. Flexible sigmoidoscopy or colonoscopy. You may have a sigmoidoscopy every 5 years or a colonoscopy every 10 years starting at age 16. Prostate cancer screening. Recommendations will vary depending on your family history and other risks. Hepatitis C blood test. Hepatitis B blood test. Sexually transmitted disease (STD) testing. Diabetes screening. This is done by checking your blood sugar (glucose) after you have  not eaten for a while (fasting). You may have this done every 1-3 years. Abdominal aortic aneurysm (AAA) screening. You may need this if you are a current or former smoker. Osteoporosis. You may be screened starting at age 14 if you are at high risk. Talk with your health care  provider about your test results, treatment options, and if necessary, the need for more tests. Vaccines  Your health care provider may recommend certain vaccines, such as: Influenza vaccine. This is recommended every year. Tetanus, diphtheria, and acellular pertussis (Tdap, Td) vaccine. You may need a Td booster every 10 years. Zoster vaccine. You may need this after age 45. Pneumococcal 13-valent conjugate (PCV13) vaccine. One dose is recommended after age 66. Pneumococcal polysaccharide (PPSV23) vaccine. One dose is recommended after age 30. Talk to your health care provider about which screenings and vaccines you need and how often you need them. This information is not intended to replace advice given to you by your health care provider. Make sure you discuss any questions you have with your health care provider. Document Released: 03/11/2015 Document Revised: 11/02/2015 Document Reviewed: 12/14/2014 Elsevier Interactive Patient Education  2017 [employer].  Fall Prevention in the Home Falls can cause injuries. They can happen to people of all ages. There are many things you can do to make your home safe and to help prevent falls. What can I do on the outside of my home? Regularly fix the edges of walkways and driveways and fix any cracks. Remove anything that might make you trip as you walk through a door, such as a raised step or threshold. Trim any bushes or trees on the path to your home. Use bright outdoor lighting. Clear any walking paths of anything that might make someone trip, such as rocks or tools. Regularly check to see if handrails are loose or broken. Make sure that both sides of any steps have handrails. Any raised decks and porches should have guardrails on the edges. Have any leaves, snow, or ice cleared regularly. Use sand or salt on walking paths during winter. Clean up any spills in your garage right away. This includes oil or grease spills. What can I do in the  bathroom? Use night lights. Install grab bars by the toilet and in the tub and shower. Do not use towel bars as grab bars. Use non-skid mats or decals in the tub or shower. If you need to sit down in the shower, use a plastic, non-slip stool. Keep the floor dry. Clean up any water that spills on the floor as soon as it happens. Remove soap buildup in the tub or shower regularly. Attach bath mats securely with double-sided non-slip rug tape. Do not have throw rugs and other things on the floor that can make you trip. What can I do in the bedroom? Use night lights. Make sure that you have a light by your bed that is easy to reach. Do not use any sheets or blankets that are too big for your bed. They should not hang down onto the floor. Have a firm chair that has side arms. You can use this for support while you get dressed. Do not have throw rugs and other things on the floor that can make you trip. What can I do in the kitchen? Clean up any spills right away. Avoid walking on wet floors. Keep items that you use a lot in easy-to-reach places. If you need to reach something above you, use a strong step stool  that has a grab bar. Keep electrical cords out of the way. Do not use floor polish or wax that makes floors slippery. If you must use wax, use non-skid floor wax. Do not have throw rugs and other things on the floor that can make you trip. What can I do with my stairs? Do not leave any items on the stairs. Make sure that there are handrails on both sides of the stairs and use them. Fix handrails that are broken or loose. Make sure that handrails are as long as the stairways. Check any carpeting to make sure that it is firmly attached to the stairs. Fix any carpet that is loose or worn. Avoid having throw rugs at the top or bottom of the stairs. If you do have throw rugs, attach them to the floor with carpet tape. Make sure that you have a light switch at the top of the stairs and the  bottom of the stairs. If you do not have them, ask someone to add them for you. What else can I do to help prevent falls? Wear shoes that: Do not have high heels. Have rubber bottoms. Are comfortable and fit you well. Are closed at the toe. Do not wear sandals. If you use a stepladder: Make sure that it is fully opened. Do not climb a closed stepladder. Make sure that both sides of the stepladder are locked into place. Ask someone to hold it for you, if possible. Clearly mark and make sure that you can see: Any grab bars or handrails. First and last steps. Where the edge of each step is. Use tools that help you move around (mobility aids) if they are needed. These include: Canes. Walkers. Scooters. Crutches. Turn on the lights when you go into a dark area. Replace any light bulbs as soon as they burn out. Set up your furniture so you have a clear path. Avoid moving your furniture around. If any of your floors are uneven, fix them. If there are any pets around you, be aware of where they are. Review your medicines with your doctor. Some medicines can make you feel dizzy. This can increase your chance of falling. Ask your doctor what other things that you can do to help prevent falls. This information is not intended to replace advice given to you by your health care provider. Make sure you discuss any questions you have with your health care provider. Document Released: 12/09/2008 Document Revised: 07/21/2015 Document Reviewed: 03/19/2014 Elsevier Interactive Patient Education  2017 [employer].

## 2022-10-03 ENCOUNTER — Other Ambulatory Visit: Payer: Self-pay

## 2022-10-03 ENCOUNTER — Ambulatory Visit: Payer: Medicare PPO | Attending: Family Medicine

## 2022-10-03 VITALS — Ht 72.0 in | Wt 258.0 lb

## 2022-10-03 DIAGNOSIS — E039 Hypothyroidism, unspecified: Secondary | ICD-10-CM

## 2022-10-03 DIAGNOSIS — E1165 Type 2 diabetes mellitus with hyperglycemia: Secondary | ICD-10-CM

## 2022-10-03 DIAGNOSIS — Z0000 Encounter for general adult medical examination without abnormal findings: Secondary | ICD-10-CM

## 2022-10-03 MED ORDER — TRULICITY 3 MG/0.5ML ~~LOC~~ SOAJ
3.0000 mg | SUBCUTANEOUS | 1 refills | Status: DC
  Filled 2022-10-03 – 2022-10-10 (×2): qty 6, 84d supply, fill #0
  Filled 2023-01-02: qty 2, 28d supply, fill #1
  Filled 2023-02-04: qty 2, 28d supply, fill #2
  Filled 2023-02-28: qty 2, 28d supply, fill #3

## 2022-10-03 MED ORDER — LEVOTHYROXINE SODIUM 175 MCG PO TABS
175.0000 ug | ORAL_TABLET | Freq: Every day | ORAL | 1 refills | Status: AC
Start: 2022-10-03 — End: ?
  Filled 2022-10-10: qty 90, 90d supply, fill #0

## 2022-10-03 NOTE — Progress Notes (Unsigned)
Subjective:   Ray Garner is a 67 y.o. male who presents for an Initial Medicare Annual Wellness Visit.  Visit Complete: {VISITMETHOD:[phone]}  Patient Medicare AWV questionnaire was completed by the patient on ***; I have confirmed that all information answered by patient is correct and no changes since this date.  Vital Signs: {telehealth vitals:30100}  Review of Systems    ***       Objective:    There were no vitals filed for this visit. There is no height or weight on file to calculate BMI.     09/11/2020    1:39 PM 01/16/2020   10:54 AM 09/15/2019    2:21 PM 07/26/2017   10:40 AM 12/19/2016    1:49 PM 12/17/2016    3:04 PM 08/01/2015    4:15 PM  Advanced Directives  Does Patient Have a Medical Advance Directive? No No No No No No No  Does patient want to make changes to medical advance directive? No - Patient declined No - Patient declined       Would patient like information on creating a medical advance directive? No - Patient declined   No - Patient declined   Yes - Educational materials given    Current Medications (verified) Outpatient Encounter Medications as of 10/03/2022  Medication Sig   allopurinol (ZYLOPRIM) 300 MG tablet Take 1 tablet (300 mg total) by mouth daily.   amLODipine (NORVASC) 10 MG tablet Take 1 tablet (10 mg total) by mouth daily.   amoxicillin (AMOXIL) 500 MG capsule Take 1 capsule (500 mg total) by mouth 3 (three) times daily. (Patient not taking: Reported on 10/12/2021)   atorvastatin (LIPITOR) 40 MG tablet Take 1 tablet (40 mg total) by mouth daily.   carvedilol (COREG) 6.25 MG tablet TAKE 1 TABLET (6.25 MG TOTAL) BY MOUTH 2 (TWO) TIMES DAILY WITH A MEAL.   Continuous Blood Gluc Receiver (FREESTYLE LIBRE 14 DAY READER) DEVI 1 each by Does not apply route 3 (three) times daily with meals.   Continuous Blood Gluc Sensor (FREESTYLE LIBRE 14 DAY SENSOR) MISC 1 each by Does not apply route 3 (three) times daily with meals.   cyclobenzaprine  (FLEXERIL) 5 MG tablet Take 1 tablet (5 mg total) by mouth 3 (three) times daily as needed.   Dulaglutide (TRULICITY) 3 MG/0.5ML SOPN Inject 3 mg as directed once a week.   empagliflozin (JARDIANCE) 25 MG TABS tablet Take 1 tablet (25 mg total) by mouth daily before breakfast.   ergocalciferol (DRISDOL) 1.25 MG (50000 UT) capsule Take 1 capsule (50,000 Units total) by mouth once a week.   fluticasone (FLONASE) 50 MCG/ACT nasal spray Place 2 sprays into both nostrils daily.   gabapentin (NEURONTIN) 300 MG capsule Take 1 capsule (300 mg total) by mouth at bedtime.   glucose blood (BAYER CONTOUR TEST) test strip Use as instructed   hydrocortisone 1 % ointment Apply 1 application topically 2 (two) times daily.   ibuprofen (ADVIL) 800 MG tablet Take 1 tablet (800 mg total) by mouth 3 (three) times daily.   insulin glargine (LANTUS SOLOSTAR) 100 UNIT/ML Solostar Pen Inject 48 Units into the skin daily.   Insulin Pen Needle (TRUEPLUS PEN NEEDLES) 31G X 6 MM MISC USE AS DIRECTED.   levothyroxine (SYNTHROID) 175 MCG tablet Take 1 tablet (175 mcg total) by mouth daily before breakfast.   losartan (COZAAR) 100 MG tablet Take 1 tablet (100 mg total) by mouth daily.   meclizine (ANTIVERT) 25 MG tablet Take 1 tablet (25  mg total) by mouth 2 (two) times daily as needed for dizziness.   metFORMIN (GLUCOPHAGE) 500 MG tablet TAKE 2 TABLETS BY MOUTH TWICE DAILY WITH A MEAL   metoCLOPramide (REGLAN) 5 MG tablet Take 1 tablet (5 mg total) by mouth 3 (three) times daily before meals.   omeprazole (PRILOSEC) 20 MG capsule TAKE 1 CAPSULE (20 MG TOTAL) BY MOUTH DAILY.   polyethylene glycol (MIRALAX / GLYCOLAX) 17 g packet Take 17 g by mouth daily.   Spacer/Aero-Holding Chambers (AEROCHAMBER PLUS) inhaler Use as instructed   vitamin B-12 (CYANOCOBALAMIN) 1000 MCG tablet Take 1,000 mcg by mouth daily.   No facility-administered encounter medications on file as of 10/03/2022.    Allergies (verified) Vicodin  [hydrocodone-acetaminophen]   History: Past Medical History:  Diagnosis Date   Anemia    Arthritis    Back pain, lumbosacral 06/30/2013   Blood transfusion without reported diagnosis    Cataract    BEGINNING   DM type 2 (diabetes mellitus, type 2) (HCC) 06/30/2013   Gout    HTN (hypertension) 06/30/2013   Hypertension    Hypothyroid 06/30/2013   Past Surgical History:  Procedure Laterality Date   CYST EXCISION     FRACTURE SURGERY     Family History  Problem Relation Age of Onset   Breast cancer Niece    Colon cancer Neg Hx    Colon polyps Neg Hx    Esophageal cancer Neg Hx    Rectal cancer Neg Hx    Stomach cancer Neg Hx    Social History   Socioeconomic History   Marital status: Divorced    Spouse name: Not on file   Number of children: Not on file   Years of education: Not on file   Highest education level: Not on file  Occupational History   Not on file  Tobacco Use   Smoking status: Former    Current packs/day: 0.00    Types: Cigarettes    Quit date: 05/14/1983    Years since quitting: 39.4   Smokeless tobacco: Former  Building services engineer status: Former  Substance and Sexual Activity   Alcohol use: Yes    Comment: OCC.   Drug use: No   Sexual activity: Not on file  Other Topics Concern   Not on file  Social History Narrative   Not on file   Social Determinants of Health   Financial Resource Strain: Not on file  Food Insecurity: Not on file  Transportation Needs: Not on file  Physical Activity: Not on file  Stress: Not on file  Social Connections: Unknown (07/11/2021)   Received from [facility]   Social Network    Social Network: Not on file    Tobacco Counseling Counseling given: Not Answered   Clinical Intake:                        Activities of Daily Living     No data to display          Patient Care Team: Hoy Register, MD as PCP - General (Family Medicine)  Indicate any recent Medical  Services you may have received from other than [facility] providers in the past year (date may be approximate).     Assessment:   This is a routine wellness examination for Ray Garner.  Hearing/Vision screen No results found.  Dietary issues and exercise activities discussed:     Goals Addressed   None  Depression Screen    03/14/2022    3:59 PM 12/06/2021    3:41 PM 10/12/2021   10:24 AM 02/08/2021    4:18 PM 02/24/2020    2:12 PM 09/15/2019    2:26 PM 11/12/2018    1:51 PM  PHQ 2/9 Scores  PHQ - 2 Score 0 0 0 0 0 0 0  PHQ- 9 Score 0 0 0 0 0 0 0    Fall Risk    03/14/2022    3:59 PM 12/06/2021    3:41 PM 02/08/2021    4:08 PM 12/16/2019   10:38 AM 09/15/2019    2:21 PM  Fall Risk   Falls in the past year? 0 0 0 1 0  Number falls in past yr: 0 0  0   Injury with Fall? 0 0  0   Risk for fall due to :  No Fall Risks   No Fall Risks    MEDICARE RISK AT HOME:   TIMED UP AND GO:  Was the test performed? No    Cognitive Function:        Immunizations Immunization History  Administered Date(s) Administered   Fluad Quad(high Dose 65+) 12/06/2021   Influenza,inj,Quad PF,6+ Mos 04/28/2018, 11/12/2018, 12/16/2019   PFIZER(Purple Top)SARS-COV-2 Vaccination 08/12/2019, 09/04/2019   PNEUMOCOCCAL CONJUGATE-20 12/06/2021   Pneumococcal Polysaccharide-23 04/11/2016   Tdap 04/28/2018    TDAP status: Up to date  Flu Vaccine status: Due, Education has been provided regarding the importance of this vaccine. Advised may receive this vaccine at local pharmacy or Health Dept. Aware to provide a copy of the vaccination record if obtained from local pharmacy or Health Dept. Verbalized acceptance and understanding.  Pneumococcal vaccine status: Up to date  Covid-19 vaccine status: Information provided on how to obtain vaccines.   Qualifies for Shingles Vaccine? Yes   Zostavax completed No   Shingrix Completed?: No.    Education has been provided regarding the importance of this  vaccine. Patient has been advised to call insurance company to determine out of pocket expense if they have not yet received this vaccine. Advised may also receive vaccine at local pharmacy or Health Dept. Verbalized acceptance and understanding.  Screening Tests Health Maintenance  Topic Date Due   Medicare Annual Wellness (AWV)  Never done   Zoster Vaccines- Shingrix (1 of 2) Never done   OPHTHALMOLOGY EXAM  10/07/2019   FOOT EXAM  09/14/2020   COVID-19 Vaccine (3 - 2023-24 season) 10/27/2021   HEMOGLOBIN A1C  09/12/2022   INFLUENZA VACCINE  09/27/2022   Diabetic kidney evaluation - eGFR measurement  03/17/2023   Diabetic kidney evaluation - Urine ACR  03/17/2023   Colonoscopy  10/16/2023   DTaP/Tdap/Td (2 - Td or Tdap) 04/27/2028   Pneumonia Vaccine 46+ Years old  Completed   Hepatitis C Screening  Completed   HIV Screening  Completed   HPV VACCINES  Aged Out    Health Maintenance  Health Maintenance Due  Topic Date Due   Medicare Annual Wellness (AWV)  Never done   Zoster Vaccines- Shingrix (1 of 2) Never done   OPHTHALMOLOGY EXAM  10/07/2019   FOOT EXAM  09/14/2020   COVID-19 Vaccine (3 - 2023-24 season) 10/27/2021   HEMOGLOBIN A1C  09/12/2022   INFLUENZA VACCINE  09/27/2022    Colorectal cancer screening: Type of screening: Colonoscopy. Completed 10/16/18. Repeat every 5 years  Lung Cancer Screening: (Low Dose CT Chest recommended if Age 9-80 years, 20 pack-year currently smoking OR have  quit w/in 15years.) does not qualify.   Lung Cancer Screening Referral: n/a  Additional Screening:  Hepatitis C Screening: does qualify; Completed 04/11/16  Vision Screening: Recommended annual ophthalmology exams for early detection of glaucoma and other disorders of the eye. Is the patient up to date with their annual eye exam?  {YES/NO:21197} Who is the provider or what is the name of the office in which the patient attends annual eye exams? *** If pt is not established with a  provider, would they like to be referred to a provider to establish care? {YES/NO:21197}.   Dental Screening: Recommended annual dental exams for proper oral hygiene  Diabetic Foot Exam: Diabetic Foot Exam: Overdue, Pt has been advised about the importance in completing this exam. Pt is scheduled for diabetic foot exam on at next office visit .  Community Resource Referral / Chronic Care Management: CRR required this visit?  {YES/NO:21197}  CCM required this visit?  {CCM Required [phone]}    Plan:     I have personally reviewed and noted the following in the patient's chart:   Medical and social history Use of alcohol, tobacco or illicit drugs  Current medications and supplements including opioid prescriptions. {Opioid [phone]} Functional ability and status Nutritional status Physical activity Advanced directives List of other physicians Hospitalizations, surgeries, and ER visits in previous 12 months Vitals Screenings to include cognitive, depression, and falls Referrals and appointments  In addition, I have reviewed and discussed with patient certain preventive protocols, quality metrics, and best practice recommendations. A written personalized care plan for preventive services as well as general preventive health recommendations were provided to patient.     Kandis Fantasia [place], [place]   03/03/1094   After Visit Summary: {CHL AMB AWV After Visit Summary:[id number]}  Nurse Notes: ***

## 2022-10-05 ENCOUNTER — Other Ambulatory Visit: Payer: Self-pay

## 2022-10-08 ENCOUNTER — Other Ambulatory Visit: Payer: Self-pay

## 2022-10-09 ENCOUNTER — Other Ambulatory Visit: Payer: Self-pay

## 2022-10-10 ENCOUNTER — Other Ambulatory Visit: Payer: Self-pay

## 2022-10-11 ENCOUNTER — Other Ambulatory Visit: Payer: Self-pay

## 2022-10-26 ENCOUNTER — Other Ambulatory Visit: Payer: Self-pay | Admitting: Pharmacist

## 2022-10-26 ENCOUNTER — Other Ambulatory Visit: Payer: Self-pay

## 2022-10-26 DIAGNOSIS — I152 Hypertension secondary to endocrine disorders: Secondary | ICD-10-CM

## 2022-10-26 DIAGNOSIS — Z794 Long term (current) use of insulin: Secondary | ICD-10-CM

## 2022-10-26 MED ORDER — LOSARTAN POTASSIUM 100 MG PO TABS
100.0000 mg | ORAL_TABLET | Freq: Every day | ORAL | 6 refills | Status: DC
Start: 2022-10-26 — End: 2023-11-12
  Filled 2022-10-26: qty 30, 30d supply, fill #0
  Filled 2023-05-24: qty 90, 90d supply, fill #0
  Filled 2023-08-12: qty 90, 90d supply, fill #1

## 2022-10-26 MED ORDER — ATORVASTATIN CALCIUM 40 MG PO TABS
40.0000 mg | ORAL_TABLET | Freq: Every day | ORAL | 1 refills | Status: AC
Start: 2022-10-26 — End: ?
  Filled 2022-10-26 – 2023-01-02 (×2): qty 90, 90d supply, fill #0
  Filled 2023-04-29: qty 90, 90d supply, fill #1

## 2022-10-26 MED ORDER — ATORVASTATIN CALCIUM 40 MG PO TABS
40.0000 mg | ORAL_TABLET | Freq: Every day | ORAL | 1 refills | Status: DC
Start: 2022-10-26 — End: 2022-10-26
  Filled 2022-10-26: qty 90, 90d supply, fill #0

## 2022-10-26 MED ORDER — LOSARTAN POTASSIUM 100 MG PO TABS
100.0000 mg | ORAL_TABLET | Freq: Every day | ORAL | 1 refills | Status: DC
  Filled 2022-10-26: qty 90, 90d supply, fill #0

## 2022-10-26 NOTE — Progress Notes (Signed)
Patient ID: Ray Garner, male   DOB: February 13, 1956, 67 y.o.   MRN: [id number]  Pharmacy Quality Measure Review  This patient is appearing on a report for being at risk of failing the adherence measure for cholesterol (statin) and hypertension (ACEi/ARB) medications this calendar year.   Medication: atorvastatin Last fill date: 09/06/2022 for 90 day supply  Insurance report was not up to date. No action needed at this time.   Medication: losartan Last fill date: 09/06/2022 for 30 day supply  90-ds rxn with refills sent.   Butch Penny, PharmD, P[place]y [place], CPP Clinical Pharmacist [facility] [phone]

## 2022-11-01 ENCOUNTER — Other Ambulatory Visit: Payer: Self-pay

## 2022-11-27 ENCOUNTER — Encounter: Payer: Self-pay | Admitting: Family Medicine

## 2022-11-27 ENCOUNTER — Other Ambulatory Visit: Payer: Self-pay

## 2022-11-27 ENCOUNTER — Other Ambulatory Visit: Payer: Self-pay | Admitting: Physician Assistant

## 2022-11-27 ENCOUNTER — Ambulatory Visit: Payer: Medicare HMO | Attending: Family Medicine | Admitting: Family Medicine

## 2022-11-27 VITALS — BP 128/74 | HR 76 | Ht 72.0 in | Wt 272.2 lb

## 2022-11-27 DIAGNOSIS — Z794 Long term (current) use of insulin: Secondary | ICD-10-CM | POA: Diagnosis not present

## 2022-11-27 DIAGNOSIS — R110 Nausea: Secondary | ICD-10-CM

## 2022-11-27 DIAGNOSIS — M5431 Sciatica, right side: Secondary | ICD-10-CM | POA: Diagnosis not present

## 2022-11-27 DIAGNOSIS — I152 Hypertension secondary to endocrine disorders: Secondary | ICD-10-CM

## 2022-11-27 DIAGNOSIS — E1159 Type 2 diabetes mellitus with other circulatory complications: Secondary | ICD-10-CM

## 2022-11-27 DIAGNOSIS — Z7984 Long term (current) use of oral hypoglycemic drugs: Secondary | ICD-10-CM

## 2022-11-27 DIAGNOSIS — Z202 Contact with and (suspected) exposure to infections with a predominantly sexual mode of transmission: Secondary | ICD-10-CM

## 2022-11-27 DIAGNOSIS — E1165 Type 2 diabetes mellitus with hyperglycemia: Secondary | ICD-10-CM | POA: Diagnosis not present

## 2022-11-27 DIAGNOSIS — Z23 Encounter for immunization: Secondary | ICD-10-CM

## 2022-11-27 DIAGNOSIS — K3184 Gastroparesis: Secondary | ICD-10-CM | POA: Diagnosis not present

## 2022-11-27 DIAGNOSIS — E039 Hypothyroidism, unspecified: Secondary | ICD-10-CM

## 2022-11-27 DIAGNOSIS — M5412 Radiculopathy, cervical region: Secondary | ICD-10-CM

## 2022-11-27 DIAGNOSIS — Z7985 Long-term (current) use of injectable non-insulin antidiabetic drugs: Secondary | ICD-10-CM

## 2022-11-27 LAB — POCT GLYCOSYLATED HEMOGLOBIN (HGB A1C): HbA1c, POC (controlled diabetic range): 8 % — AB (ref 0.0–7.0)

## 2022-11-27 MED ORDER — METRONIDAZOLE 500 MG PO TABS
2000.0000 mg | ORAL_TABLET | Freq: Once | ORAL | 0 refills | Status: AC
  Filled 2022-11-27: qty 4, 1d supply, fill #0

## 2022-11-27 MED ORDER — METFORMIN HCL 500 MG PO TABS
1000.0000 mg | ORAL_TABLET | Freq: Two times a day (BID) | ORAL | 1 refills | Status: DC
  Filled 2022-11-27 – 2023-05-24 (×2): qty 360, 90d supply, fill #0
  Filled 2023-08-12: qty 360, 90d supply, fill #1

## 2022-11-27 MED ORDER — LANTUS SOLOSTAR 100 UNIT/ML ~~LOC~~ SOPN
50.0000 [IU] | PEN_INJECTOR | Freq: Every day | SUBCUTANEOUS | 3 refills | Status: DC
  Filled 2022-11-27 – 2023-01-02 (×2): qty 30, 60d supply, fill #0
  Filled 2023-02-28: qty 30, 60d supply, fill #1
  Filled 2023-04-29: qty 30, 60d supply, fill #2
  Filled 2023-08-08: qty 30, 60d supply, fill #3

## 2022-11-27 MED ORDER — AMLODIPINE BESYLATE 10 MG PO TABS
10.0000 mg | ORAL_TABLET | Freq: Every day | ORAL | 1 refills | Status: DC
  Filled 2022-11-27 – 2023-01-02 (×2): qty 90, 90d supply, fill #0
  Filled 2023-04-29: qty 90, 90d supply, fill #1

## 2022-11-27 MED ORDER — METOCLOPRAMIDE HCL 5 MG PO TABS
5.0000 mg | ORAL_TABLET | Freq: Three times a day (TID) | ORAL | 1 refills | Status: DC
  Filled 2022-11-27: qty 90, 30d supply, fill #0
  Filled 2023-01-02: qty 90, 30d supply, fill #1

## 2022-11-27 MED ORDER — EMPAGLIFLOZIN 25 MG PO TABS
25.0000 mg | ORAL_TABLET | Freq: Every day | ORAL | 1 refills | Status: DC
  Filled 2022-11-27: qty 90, 90d supply, fill #0
  Filled 2023-04-29: qty 90, 90d supply, fill #1

## 2022-11-27 NOTE — Progress Notes (Signed)
Subjective:  Patient ID: Ray Garner, male    DOB: 1955-12-15  Age: 67 y.o. MRN: [id number]  CC: Medical Management of Chronic Issues (Nausea/Right arm pain/Lower back pain)   HPI Ray Garner is a 67 y.o. year old male with a history of Type 2 diabetes mellitus (A1c 8.0), hypertension, hypothyroidism, Gout   Interval History: Discussed the use of AI scribe software for clinical note transcription with the patient, who gave verbal consent to proceed.  He presents with right arm pain, lower back pain, and nausea. The right arm pain began after a fall against a fence last year, which initially resulted in numbness. The numbness has since returned and is now accompanied by pain. The patient also reports neck pain. The pain and numbness are somewhat relieved by hot baths with Epsom salt.  The patient's lower back pain is sharp and worsens upon standing. The pain sometimes radiates to the right leg. The patient has found some relief with ibuprofen and a muscle relaxant.  The patient has been experiencing nausea for the past couple of weeks. The nausea is particularly noticeable after eating, although the patient has been eating less due to the appetite-suppressing effects of Trulicity. The patient has been on Trulicity for a long time, but the nausea only started about a month ago. The patient also reports frequent burping. He is doing well on his antihypertensive The patient also mentions a recent exposure to trichomoniasis and requests treatment.        Past Medical History:  Diagnosis Date   Anemia    Arthritis    Back pain, lumbosacral 06/30/2013   Blood transfusion without reported diagnosis    Cataract    BEGINNING   DM type 2 (diabetes mellitus, type 2) (HCC) 06/30/2013   Gout    HTN (hypertension) 06/30/2013   Hypertension    Hypothyroid 06/30/2013    Past Surgical History:  Procedure Laterality Date   CYST EXCISION     FRACTURE SURGERY      Family History  Problem Relation  Age of Onset   Breast cancer Niece    Colon cancer Neg Hx    Colon polyps Neg Hx    Esophageal cancer Neg Hx    Rectal cancer Neg Hx    Stomach cancer Neg Hx     Social History   Socioeconomic History   Marital status: Divorced    Spouse name: Not on file   Number of children: Not on file   Years of education: Not on file   Highest education level: Not on file  Occupational History   Not on file  Tobacco Use   Smoking status: Former    Current packs/day: 0.00    Types: Cigarettes    Quit date: 05/14/1983    Years since quitting: 39.5   Smokeless tobacco: Former  Building services engineer status: Former  Substance and Sexual Activity   Alcohol use: Yes    Comment: OCC.   Drug use: No   Sexual activity: Not on file  Other Topics Concern   Not on file  Social History Narrative   Not on file   Social Determinants of Health   Financial Resource Strain: Low Risk  (10/03/2022)   Overall Financial Resource Strain (CARDIA)    Difficulty of Paying Living Expenses: Not hard at all  Food Insecurity: No Food Insecurity (10/03/2022)   Hunger Vital Sign    Worried About Running Out of Food in the Last Year: Never true  Ran Out of Food in the Last Year: Never true  Transportation Needs: No Transportation Needs (10/03/2022)   PRAPARE - Administrator, Civil Service (Medical): No    Lack of Transportation (Non-Medical): No  Physical Activity: Insufficiently Active (10/03/2022)   Exercise Vital Sign    Days of Exercise per Week: 3 days    Minutes of Exercise per Session: 30 min  Stress: No Stress Concern Present (10/03/2022)   [employer] of Occupational Health - Occupational Stress Questionnaire    Feeling of Stress : Not at all  Social Connections: Moderately Isolated (10/03/2022)   Social Connection and Isolation Panel [NHANES]    Frequency of Communication with Friends and Family: More than three times a week    Frequency of Social Gatherings with Friends and Family:  Three times a week    Attends Religious Services: 1 to 4 times per year    Active Member of Clubs or Organizations: No    Attends Banker Meetings: Never    Marital Status: Divorced    Allergies  Allergen Reactions   Vicodin [Hydrocodone-Acetaminophen] Hives and Rash    Outpatient Medications Prior to Visit  Medication Sig Dispense Refill   allopurinol (ZYLOPRIM) 300 MG tablet Take 1 tablet (300 mg total) by mouth daily. 90 tablet 1   atorvastatin (LIPITOR) 40 MG tablet Take 1 tablet (40 mg total) by mouth daily. 90 tablet 1   Continuous Blood Gluc Receiver (FREESTYLE LIBRE 14 DAY READER) DEVI 1 each by Does not apply route 3 (three) times daily with meals. 1 Device 0   Continuous Blood Gluc Sensor (FREESTYLE LIBRE 14 DAY SENSOR) MISC 1 each by Does not apply route 3 (three) times daily with meals. 1 each 11   cyclobenzaprine (FLEXERIL) 5 MG tablet Take 1 tablet (5 mg total) by mouth 3 (three) times daily as needed. 10 tablet 0   Dulaglutide (TRULICITY) 3 MG/0.5ML SOPN Inject 3 mg as directed once a week. 6 mL 1   ergocalciferol (DRISDOL) 1.25 MG (50000 UT) capsule Take 1 capsule (50,000 Units total) by mouth once a week. 12 capsule 0   fluticasone (FLONASE) 50 MCG/ACT nasal spray Place 2 sprays into both nostrils daily. 16 g 1   gabapentin (NEURONTIN) 300 MG capsule Take 1 capsule (300 mg total) by mouth at bedtime. 30 capsule 6   glucose blood (BAYER CONTOUR TEST) test strip Use as instructed 100 each 12   hydrocortisone 1 % ointment Apply 1 application topically 2 (two) times daily. 30 g 1   ibuprofen (ADVIL) 800 MG tablet Take 1 tablet (800 mg total) by mouth 3 (three) times daily. 21 tablet 0   Insulin Pen Needle (TRUEPLUS PEN NEEDLES) 31G X 6 MM MISC USE AS DIRECTED. 100 each 5   levothyroxine (SYNTHROID) 175 MCG tablet Take 1 tablet (175 mcg total) by mouth daily before breakfast. 90 tablet 1   meclizine (ANTIVERT) 25 MG tablet Take 1 tablet (25 mg total) by mouth 2  (two) times daily as needed for dizziness. 30 tablet 0   polyethylene glycol (MIRALAX / GLYCOLAX) 17 g packet Take 17 g by mouth daily. 14 each 0   Spacer/Aero-Holding Chambers (AEROCHAMBER PLUS) inhaler Use as instructed 1 each 2   vitamin B-12 (CYANOCOBALAMIN) 1000 MCG tablet Take 1,000 mcg by mouth daily.     amLODipine (NORVASC) 10 MG tablet Take 1 tablet (10 mg total) by mouth daily. 90 tablet 1   empagliflozin (JARDIANCE) 25 MG TABS tablet  Take 1 tablet (25 mg total) by mouth daily before breakfast. 90 tablet 1   insulin glargine (LANTUS SOLOSTAR) 100 UNIT/ML Solostar Pen Inject 48 Units into the skin daily. 15 mL 2   metoCLOPramide (REGLAN) 5 MG tablet Take 1 tablet (5 mg total) by mouth 3 (three) times daily before meals. 90 tablet 1   carvedilol (COREG) 6.25 MG tablet TAKE 1 TABLET (6.25 MG TOTAL) BY MOUTH 2 (TWO) TIMES DAILY WITH A MEAL. 60 tablet 6   losartan (COZAAR) 100 MG tablet Take 1 tablet (100 mg total) by mouth daily. 30 tablet 6   omeprazole (PRILOSEC) 20 MG capsule TAKE 1 CAPSULE (20 MG TOTAL) BY MOUTH DAILY. 90 capsule 1   metFORMIN (GLUCOPHAGE) 500 MG tablet TAKE 2 TABLETS BY MOUTH TWICE DAILY WITH A MEAL 360 tablet 1   No facility-administered medications prior to visit.     ROS Review of Systems  Constitutional:  Negative for activity change and appetite change.  HENT:  Negative for sinus pressure and sore throat.   Respiratory:  Negative for chest tightness, shortness of breath and wheezing.   Cardiovascular:  Negative for chest pain and palpitations.  Gastrointestinal:  Negative for abdominal distention, abdominal pain and constipation.  Genitourinary: Negative.   Musculoskeletal:        See HPI  Psychiatric/Behavioral:  Negative for behavioral problems and dysphoric mood.     Objective:  BP 128/74   Pulse 76   Ht 6' (1.829 m)   Wt 272 lb 3.2 oz (123.5 kg)   SpO2 97%   BMI 36.92 kg/m      11/27/2022    3:33 PM 10/03/2022    2:10 PM 03/14/2022    3:57  PM  BP/Weight  Systolic BP 128 -- 136  Diastolic BP 74 -- 85  Wt. (Lbs) 272.2 258 258.2  BMI 36.92 kg/m2 34.99 kg/m2 35.02 kg/m2      Physical Exam Constitutional:      Appearance: He is well-developed.  Cardiovascular:     Rate and Rhythm: Normal rate.     Heart sounds: Normal heart sounds. No murmur heard. Pulmonary:     Effort: Pulmonary effort is normal.     Breath sounds: Normal breath sounds. No wheezing or rales.  Chest:     Chest wall: No tenderness.  Abdominal:     General: Bowel sounds are normal. There is no distension.     Palpations: Abdomen is soft. There is no mass.     Tenderness: There is no abdominal tenderness.  Musculoskeletal:        General: Normal range of motion.     Right lower leg: No edema.     Left lower leg: No edema.  Neurological:     Mental Status: He is alert and oriented to person, place, and time.  Psychiatric:        Mood and Affect: Mood normal.        Latest Ref Rng & Units 03/16/2022    8:36 AM 09/14/2021    6:02 PM 09/11/2020    1:43 PM  CMP  Glucose 70 - 99 mg/dL 638  756  433   BUN 8 - 27 mg/dL 11  34  10   Creatinine 0.76 - 1.27 mg/dL 2.95  1.88  4.16   Sodium 134 - 144 mmol/L 142  136  136   Potassium 3.5 - 5.2 mmol/L 4.6  4.1  4.6   Chloride 96 - 106 mmol/L 106  103  102   CO2 20 - 29 mmol/L 23  24  28    Calcium 8.6 - 10.2 mg/dL 9.2  9.4  9.4   Total Protein 6.0 - 8.5 g/dL 7.0   8.1   Total Bilirubin 0.0 - 1.2 mg/dL 0.2   0.7   Alkaline Phos 44 - 121 IU/L 109   76   AST 0 - 40 IU/L 16   21   ALT 0 - 44 IU/L 19   21     Lipid Panel     Component Value Date/Time   CHOL 102 03/16/2022 0836   TRIG 145 03/16/2022 0836   HDL 31 (L) 03/16/2022 0836   CHOLHDL 3.4 12/16/2019 1140   CHOLHDL 3.6 08/08/2015 0845   VLDL 34 (H) 08/08/2015 0845   LDLCALC 46 03/16/2022 0836    CBC    Component Value Date/Time   WBC 14.1 (H) 09/14/2021 1802   RBC 5.21 09/14/2021 1802   HGB 14.3 09/14/2021 1802   HGB 13.2 08/28/2018  1507   HCT 43.9 09/14/2021 1802   HCT 39.0 08/28/2018 1507   PLT 351 09/14/2021 1802   PLT 274 08/28/2018 1507   MCV 84.3 09/14/2021 1802   MCV 84 08/28/2018 1507   MCH 27.4 09/14/2021 1802   MCHC 32.6 09/14/2021 1802   RDW 13.6 09/14/2021 1802   RDW 13.4 08/28/2018 1507   LYMPHSABS 3.5 09/14/2021 1802   LYMPHSABS 3.6 (H) 08/28/2018 1507   MONOABS 1.0 09/14/2021 1802   EOSABS 0.2 09/14/2021 1802   EOSABS 0.2 08/28/2018 1507   BASOSABS 0.1 09/14/2021 1802   BASOSABS 0.0 08/28/2018 1507    Lab Results  Component Value Date   HGBA1C 8.0 (A) 11/27/2022   Lab Results  Component Value Date   TSH 0.403 (L) 03/16/2022     Assessment & Plan:      Cervical radiculopathy Causing numbness and pain in the right arm. -Refer to physical therapy for neck and arm treatment. -Restart Flexeril for muscle relaxation.  Lower Back Pain Likely sciatica, causing sharp pain in the lower back and right leg. -Refer to physical therapy for back treatment. -Continue ibuprofen as needed for pain.  Nausea Recent onset, possibly related to Trulicity use or overeating. -Advise small, frequent meals to avoid overeating. -Order test for H. pylori bacteria -Reglan prescribed -His chart does reveal a possible previous history of gastroparesis -If symptoms persist we will need to discuss discontinuing Trulicity but at this time he is hesitant to doing so as he is concerned of rebound weight gain  Type 2 Diabetes Mellitus Recent A1C of 8.0, up from 7.4, despite consistent use of Trulicity. -Increase Lantus dose from 48 units to 50 units daily. -Continue Trulicity 3mg  weekly.  Sexually Transmitted Infection exposure Possible exposure to Trichomonas from a sexual partner. -Prescribe metronidazole  Hypothyroidism TSH was suppressed in 02/2022 Repeat TSH and adjust levothyroxine dose accordingly  General Health Maintenance -Order routine blood tests. -Continue regular eye exams (recently  completed).          Meds ordered this encounter  Medications   amLODipine (NORVASC) 10 MG tablet    Sig: Take 1 tablet (10 mg total) by mouth daily.    Dispense:  90 tablet    Refill:  1   empagliflozin (JARDIANCE) 25 MG TABS tablet    Sig: Take 1 tablet (25 mg total) by mouth daily before breakfast.    Dispense:  90 tablet    Refill:  1   metFORMIN (GLUCOPHAGE)  500 MG tablet    Sig: Take 2 tablets (1,000 mg total) by mouth 2 (two) times daily with a meal.    Dispense:  360 tablet    Refill:  1   metoCLOPramide (REGLAN) 5 MG tablet    Sig: Take 1 tablet (5 mg total) by mouth 3 (three) times daily before meals.    Dispense:  90 tablet    Refill:  1   metroNIDAZOLE (FLAGYL) 500 MG tablet    Sig: Take 4 tablets (2,000 mg total) by mouth once for 1 dose.    Dispense:  4 tablet    Refill:  0   insulin glargine (LANTUS SOLOSTAR) 100 UNIT/ML Solostar Pen    Sig: Inject 50 Units into the skin daily.    Dispense:  30 mL    Refill:  3    Follow-up: Return in about 3 months (around 02/27/2023) for Chronic medical conditions.       Hoy Register, MD, FAAFP. [facility] [place], [place] [phone]   11/27/2022, 6:24 PM

## 2022-11-27 NOTE — Patient Instructions (Signed)
Cervical Radiculopathy  Cervical radiculopathy means that a nerve in the neck (a cervical nerve) is pinched or bruised. This can happen because of an injury to the cervical spine (vertebrae) in the neck, or as a normal part of getting older. This condition can cause pain or loss of feeling (numbness) that runs from your neck all the way down to your arm and fingers. Often, this condition gets better with rest. Treatment may be needed if the condition does not get better. What are the causes? A neck injury. A bulging disk in your spine. Sudden muscle tightening (muscle spasms). Tight muscles in your neck due to overuse. Arthritis. Breakdown in the bones and joints of the spine (spondylosis) due to getting older. Bone spurs that form near the nerves in the neck. What are the signs or symptoms? Pain. The pain may: Run from the neck to the arm and hand. Be very bad or irritating. Get worse when you move your neck. Loss of feeling or tingling in your arm or hand. Weakness in your arm or hand, in very bad cases. How is this treated? In many cases, treatment is not needed for this condition. With rest, the condition often gets better over time. If treatment is needed, options may include: Wearing a soft neck collar (cervical collar) for short periods of time. Doing exercises (physical therapy) to strengthen your neck muscles. Taking medicines. Having shots (injections) in your spine, in very bad cases. Having surgery. This may be needed if other treatments do not help. The type of surgery that is used will depend on the cause of your condition. Follow these instructions at home: If you have a soft neck collar: Wear it as told by your doctor. Take it off only as told by your doctor. Ask your doctor if you can take the collar off for cleaning and bathing. If you are allowed to take the collar off for cleaning or bathing: Follow instructions from your doctor about how to take off the collar  safely. Clean the collar by wiping it with mild soap and water and drying it completely. Take out any removable pads in the collar every 1-2 days. Wash them by hand with soap and water. Let them air-dry completely before you put them back in the collar. Check your skin under the collar for redness or sores. If you see any, tell your doctor. Managing pain     Take over-the-counter and prescription medicines only as told by your doctor. If told, put ice on the painful area. To do this: If you have a soft neck collar, take if off as told by your doctor. Put ice in a plastic bag. Place a towel between your skin and the bag. Leave the ice on for 20 minutes, 2-3 times a day. Take off the ice if your skin turns bright red. This is very important. If you cannot feel pain, heat, or cold, you have a greater risk of damage to the area. If using ice does not help, you can try using heat. Use the heat source that your doctor recommends, such as a moist heat pack or a heating pad. Place a towel between your skin and the heat source. Leave the heat on for 20-30 minutes. Take off the heat if your skin turns bright red. This is very important. If you cannot feel pain, heat, or cold, you have a greater risk of getting burned. You may try a gentle neck and shoulder rub (massage). Activity Rest as needed. Return  to your normal activities when your doctor says that it is safe. Do exercises as told by your doctor or physical therapist. You may have to avoid lifting. Ask your doctor how much you can safely lift. General instructions Use a flat pillow when you sleep. Do not drive while wearing a soft neck collar. If you do not have a soft neck collar, ask your doctor if it is safe to drive while your neck heals. Ask your doctor if you should avoid driving or using machines while you are taking your medicine. Do not smoke or use any products that contain nicotine or tobacco. If you need help quitting, ask your  doctor. Keep all follow-up visits. Contact a doctor if: Your condition does not get better with treatment. Get help right away if: Your pain gets worse and medicine does not help. You lose feeling or feel weak in your hand, arm, face, or leg. You have a high fever. Your neck is stiff. You cannot control when you poop or pee (have incontinence). You have trouble with walking, balance, or talking. Summary Cervical radiculopathy means that a nerve in the neck is pinched or bruised. A nerve can get pinched from a bulging disk, arthritis, an injury to the neck, or other causes. Symptoms include pain, tingling, or loss of feeling that goes from the neck to the arm or hand. Weakness in your arm or hand can happen in very bad cases. Treatment may include resting, wearing a soft neck collar, and doing exercises. You might need to take medicines for pain. In very bad cases, shots or surgery may be needed. This information is not intended to replace advice given to you by your health care provider. Make sure you discuss any questions you have with your health care provider. Document Revised: 08/18/2020 Document Reviewed: 08/18/2020 Elsevier Patient Education  2024 [employer].

## 2022-11-28 ENCOUNTER — Other Ambulatory Visit (HOSPITAL_COMMUNITY): Payer: Self-pay

## 2022-11-28 ENCOUNTER — Other Ambulatory Visit: Payer: Self-pay

## 2022-11-29 ENCOUNTER — Other Ambulatory Visit: Payer: Self-pay | Admitting: Family Medicine

## 2022-11-29 ENCOUNTER — Other Ambulatory Visit: Payer: Self-pay

## 2022-11-29 DIAGNOSIS — E039 Hypothyroidism, unspecified: Secondary | ICD-10-CM

## 2022-11-29 DIAGNOSIS — K219 Gastro-esophageal reflux disease without esophagitis: Secondary | ICD-10-CM

## 2022-11-29 LAB — T4, FREE: Free T4: 1.14 ng/dL (ref 0.82–1.77)

## 2022-11-29 LAB — H. PYLORI BREATH TEST

## 2022-11-29 LAB — T3: T3, Total: 62 ng/dL — ABNORMAL LOW (ref 71–180)

## 2022-11-29 LAB — TSH: TSH: 11.4 u[IU]/mL — ABNORMAL HIGH (ref 0.450–4.500)

## 2022-11-29 MED ORDER — LEVOTHYROXINE SODIUM 200 MCG PO TABS
200.0000 ug | ORAL_TABLET | Freq: Every day | ORAL | 1 refills | Status: DC
  Filled 2022-11-29: qty 90, 90d supply, fill #0

## 2022-11-29 MED ORDER — OMEPRAZOLE 20 MG PO CPDR
20.0000 mg | DELAYED_RELEASE_CAPSULE | Freq: Every day | ORAL | 1 refills | Status: DC
  Filled 2022-11-29: qty 90, 90d supply, fill #0
  Filled 2023-04-29: qty 90, 90d supply, fill #1

## 2022-12-07 ENCOUNTER — Other Ambulatory Visit (HOSPITAL_COMMUNITY): Payer: Self-pay

## 2022-12-07 ENCOUNTER — Other Ambulatory Visit: Payer: Self-pay

## 2022-12-07 DIAGNOSIS — G8929 Other chronic pain: Secondary | ICD-10-CM | POA: Diagnosis not present

## 2022-12-07 DIAGNOSIS — Z202 Contact with and (suspected) exposure to infections with a predominantly sexual mode of transmission: Secondary | ICD-10-CM | POA: Diagnosis not present

## 2022-12-07 DIAGNOSIS — R109 Unspecified abdominal pain: Secondary | ICD-10-CM | POA: Diagnosis not present

## 2022-12-07 DIAGNOSIS — M542 Cervicalgia: Secondary | ICD-10-CM | POA: Diagnosis not present

## 2022-12-07 MED ORDER — TIZANIDINE HCL 4 MG PO TABS
4.0000 mg | ORAL_TABLET | Freq: Four times a day (QID) | ORAL | 0 refills | Status: DC | PRN
  Filled 2022-12-07 (×2): qty 20, 5d supply, fill #0

## 2022-12-10 ENCOUNTER — Other Ambulatory Visit: Payer: Self-pay

## 2022-12-10 ENCOUNTER — Encounter (HOSPITAL_COMMUNITY): Payer: Self-pay

## 2022-12-10 ENCOUNTER — Ambulatory Visit: Payer: Self-pay

## 2022-12-10 ENCOUNTER — Emergency Department (HOSPITAL_COMMUNITY)
Admission: EM | Admit: 2022-12-10 | Discharge: 2022-12-10 | Disposition: A | Discharge status: Home / Self Care | Payer: Medicare HMO | Attending: Emergency Medicine | Admitting: Emergency Medicine

## 2022-12-10 DIAGNOSIS — Z7984 Long term (current) use of oral hypoglycemic drugs: Secondary | ICD-10-CM | POA: Diagnosis not present

## 2022-12-10 DIAGNOSIS — I10 Essential (primary) hypertension: Secondary | ICD-10-CM | POA: Insufficient documentation

## 2022-12-10 DIAGNOSIS — M5416 Radiculopathy, lumbar region: Secondary | ICD-10-CM | POA: Diagnosis not present

## 2022-12-10 DIAGNOSIS — E119 Type 2 diabetes mellitus without complications: Secondary | ICD-10-CM | POA: Diagnosis not present

## 2022-12-10 DIAGNOSIS — M545 Low back pain, unspecified: Secondary | ICD-10-CM | POA: Diagnosis not present

## 2022-12-10 DIAGNOSIS — Z794 Long term (current) use of insulin: Secondary | ICD-10-CM | POA: Diagnosis not present

## 2022-12-10 DIAGNOSIS — Z79899 Other long term (current) drug therapy: Secondary | ICD-10-CM | POA: Diagnosis not present

## 2022-12-10 DIAGNOSIS — M5441 Lumbago with sciatica, right side: Secondary | ICD-10-CM | POA: Diagnosis not present

## 2022-12-10 DIAGNOSIS — G8929 Other chronic pain: Secondary | ICD-10-CM | POA: Insufficient documentation

## 2022-12-10 MED ORDER — CYCLOBENZAPRINE HCL 10 MG PO TABS
10.0000 mg | ORAL_TABLET | Freq: Two times a day (BID) | ORAL | 0 refills | Status: DC | PRN
  Filled 2022-12-10: qty 20, 10d supply, fill #0

## 2022-12-10 MED ORDER — TRAMADOL HCL 50 MG PO TABS
50.0000 mg | ORAL_TABLET | Freq: Four times a day (QID) | ORAL | 0 refills | Status: DC | PRN
Start: 2022-12-10 — End: 2023-11-12
  Filled 2022-12-10: qty 15, 4d supply, fill #0

## 2022-12-10 MED ORDER — KETOROLAC TROMETHAMINE 15 MG/ML IJ SOLN
15.0000 mg | Freq: Once | INTRAMUSCULAR | Status: AC
  Administered 2022-12-10: 15 mg via INTRAMUSCULAR
  Filled 2022-12-10: qty 1

## 2022-12-10 MED ORDER — MELOXICAM 7.5 MG PO TABS
7.5000 mg | ORAL_TABLET | Freq: Two times a day (BID) | ORAL | 0 refills | Status: DC
  Filled 2022-12-10: qty 28, 14d supply, fill #0

## 2022-12-10 MED ORDER — LIDOCAINE 5 % EX PTCH
1.0000 | MEDICATED_PATCH | CUTANEOUS | Status: DC
  Administered 2022-12-10: 1 via TRANSDERMAL
  Filled 2022-12-10: qty 1

## 2022-12-10 NOTE — Telephone Encounter (Signed)
Noted  

## 2022-12-10 NOTE — ED Triage Notes (Signed)
Right sided lower back pain for a year after an injury, pt reports it is getting worse. Pt reports PCP sent him here for a ct scan

## 2022-12-10 NOTE — ED Provider Notes (Signed)
EMERGENCY DEPARTMENT AT [facility] Provider Note   CSN: [id number] Arrival date & time: 12/10/22  1331     History  Chief Complaint  Patient presents with   Back Pain    Ray Garner is a 67 y.o. male with medical history of lumbar sacral back pain, diabetes, hypertension, gout.  Patient presents to ED for evaluation of right-sided low back pain.  States that 1 year ago he had an accident and ever since then has been dealing with right-sided low back pain.  He is seen by his PCP for this and prescribed muscle relaxers.  States that he also went to an urgent care recently and was prescribed muscle relaxers.  States that the pain is just excruciating and he cannot stand it. He reports the pain radiates down his right leg and is worse when he stands for long periods of time.  He is requesting a CT scan of his lumbar spine.  He denies any recent trauma to his back.  Denies any bowel or bladder incontinence, groin numbness, weakness of his distal extremities.  He states that he reached out to the nursing staff at his PCPs office who advised him to go to the orthopedic urgent care at [facility] however he came here because he felt as if we could "just do it all".  Denies fevers.  States pain is worse with movement better with rest.  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   Back Pain Associated symptoms: no fever, no numbness and no weakness        Home Medications Prior to Admission medications   Medication Sig Start Date End Date Taking? Authorizing Provider  cyclobenzaprine (FLEXERIL) 10 MG tablet Take 1 tablet (10 mg total) by mouth 2 (two) times daily as needed for muscle spasms. 12/10/22  Yes Al Decant, PA-C  traMADol (ULTRAM) 50 MG tablet Take 1 tablet (50 mg total) by mouth every 6 (six) hours as needed. 12/10/22  Yes Al Decant, PA-C  allopurinol (ZYLOPRIM) 300 MG tablet Take 1 tablet (300 mg total) by mouth  daily. 09/27/22   Hoy Register, MD  amLODipine (NORVASC) 10 MG tablet Take 1 tablet (10 mg total) by mouth daily. 11/27/22   Hoy Register, MD  atorvastatin (LIPITOR) 40 MG tablet Take 1 tablet (40 mg total) by mouth daily. 10/26/22   Hoy Register, MD  carvedilol (COREG) 6.25 MG tablet TAKE 1 TABLET (6.25 MG TOTAL) BY MOUTH 2 (TWO) TIMES DAILY WITH A MEAL. 10/12/21 10/12/22  Anders Simmonds, PA-C  Continuous Blood Gluc Receiver (FREESTYLE LIBRE 14 DAY READER) DEVI 1 each by Does not apply route 3 (three) times daily with meals. 07/11/17   Hoy Register, MD  Continuous Blood Gluc Sensor (FREESTYLE LIBRE 14 DAY SENSOR) MISC 1 each by Does not apply route 3 (three) times daily with meals. 01/29/20   Hoy Register, MD  Dulaglutide (TRULICITY) 3 MG/0.5ML SOPN Inject 3 mg as directed once a week. 10/03/22   Hoy Register, MD  empagliflozin (JARDIANCE) 25 MG TABS tablet Take 1 tablet (25 mg total) by mouth daily before breakfast. 11/27/22   Hoy Register, MD  ergocalciferol (DRISDOL) 1.25 MG (50000 UT) capsule Take 1 capsule (50,000 Units total) by mouth once a week. 12/08/21   Hoy Register, MD  fluticasone (FLONASE) 50 MCG/ACT nasal spray Place 2 sprays into both nostrils daily. 08/28/18   Hoy Register, MD  gabapentin (NEURONTIN) 300 MG  capsule Take 1 capsule (300 mg total) by mouth at bedtime. 02/24/20   Hoy Register, MD  glucose blood (BAYER CONTOUR TEST) test strip Use as instructed 07/08/17   Mliss Sax, MD  hydrocortisone 1 % ointment Apply 1 application topically 2 (two) times daily. 11/12/18   Hoy Register, MD  ibuprofen (ADVIL) 800 MG tablet Take 1 tablet (800 mg total) by mouth 3 (three) times daily. 09/11/20   Charlynne Pander, MD  insulin glargine (LANTUS SOLOSTAR) 100 UNIT/ML Solostar Pen Inject 50 Units into the skin daily. 11/27/22   Hoy Register, MD  Insulin Pen Needle (TRUEPLUS PEN NEEDLES) 31G X 6 MM MISC USE AS DIRECTED. 11/17/21   Hoy Register, MD   levothyroxine (SYNTHROID) 200 MCG tablet Take 1 tablet (200 mcg total) by mouth daily before breakfast. 11/29/22   Hoy Register, MD  losartan (COZAAR) 100 MG tablet Take 1 tablet (100 mg total) by mouth daily. 10/26/22 11/25/22  Hoy Register, MD  meclizine (ANTIVERT) 25 MG tablet Take 1 tablet (25 mg total) by mouth 2 (two) times daily as needed for dizziness. 08/19/18   Grayce Sessions, NP  metFORMIN (GLUCOPHAGE) 500 MG tablet Take 2 tablets (1,000 mg total) by mouth 2 (two) times daily with a meal. 11/27/22 11/27/23  Hoy Register, MD  metoCLOPramide (REGLAN) 5 MG tablet Take 1 tablet (5 mg total) by mouth 3 (three) times daily before meals. 11/27/22   Hoy Register, MD  omeprazole (PRILOSEC) 20 MG capsule Take 1 capsule (20 mg total) by mouth daily. 11/29/22   Hoy Register, MD  polyethylene glycol (MIRALAX / GLYCOLAX) 17 g packet Take 17 g by mouth daily. 09/11/20   Charlynne Pander, MD  Spacer/Aero-Holding Deretha Emory (AEROCHAMBER PLUS) inhaler Use as instructed 04/03/18   Domenick Gong, MD  tiZANidine (ZANAFLEX) 4 MG tablet Take 1 tablet (4 mg total) by mouth every 6 (six) hours as needed for muscle spasms for up to 5 days. 12/07/22     vitamin B-12 (CYANOCOBALAMIN) 1000 MCG tablet Take 1,000 mcg by mouth daily.    [provider]      Allergies    Vicodin [hydrocodone-acetaminophen]    Review of Systems   Review of Systems  Constitutional:  Negative for fever.  Musculoskeletal:  Positive for back pain.  Neurological:  Negative for weakness and numbness.  All other systems reviewed and are negative.   Physical Exam Updated Vital Signs BP (!) 160/109 (BP Location: Left Arm)   Pulse 78   Temp 97.9 F (36.6 C) (Oral)   Resp 18   Ht 6' (1.829 m)   Wt 121.6 kg   SpO2 98%   BMI 36.35 kg/m  Physical Exam Vitals and nursing note reviewed.  Constitutional:      General: He is not in acute distress.    Appearance: Normal appearance. He is not ill-appearing,  toxic-appearing or diaphoretic.  HENT:     Head: Normocephalic and atraumatic.     Nose: Nose normal.     Mouth/Throat:     Mouth: Mucous membranes are moist.     Pharynx: Oropharynx is clear.  Eyes:     Extraocular Movements: Extraocular movements intact.     Conjunctiva/sclera: Conjunctivae normal.     Pupils: Pupils are equal, round, and reactive to light.  Cardiovascular:     Rate and Rhythm: Normal rate and regular rhythm.  Abdominal:     General: Abdomen is flat. Bowel sounds are normal.     Palpations: Abdomen is  soft.  Musculoskeletal:       Back:     Comments: Positive straight leg raise test right.  Area of pain circled above.  Skin:    General: Skin is warm and dry.     Capillary Refill: Capillary refill takes less than 2 seconds.  Neurological:     General: No focal deficit present.     Mental Status: He is alert and oriented to person, place, and time.     GCS: GCS eye subscore is 4. GCS verbal subscore is 5. GCS motor subscore is 6.     Cranial Nerves: Cranial nerves 2-12 are intact. No cranial nerve deficit.     Sensory: Sensation is intact. No sensory deficit.     Motor: Motor function is intact. No weakness.     Coordination: Coordination is intact. Heel to [facility] Test normal.     Comments: 5 out of 5 strength bilateral lower extremities.     ED Results / Procedures / Treatments   Labs (all labs ordered are listed, but only abnormal results are displayed) Labs Reviewed - No data to display  EKG None  Radiology No results found.  Procedures Procedures   Medications Ordered in ED Medications  lidocaine (LIDODERM) 5 % 1 patch (1 patch Transdermal Patch Applied 12/10/22 1437)  ketorolac (TORADOL) 15 MG/ML injection 15 mg (15 mg Intramuscular Given 12/10/22 1437)    ED Course/ Medical Decision Making/ A&P  Medical Decision Making Risk Prescription drug management.   67 year old man presents to ED for evaluation.  Please see HPI for further  details.  On examination patient afebrile and nontachycardic.  Lung sounds are clear bilaterally, he is nonhypoxic.  Abdomen is soft throughout.  No tenderness noted.  Patient neurological examination at baseline with 5 out of 5 strength bilateral lower extremities.  He does have a positive straight leg raise test on the right.  Chart reviewed.  Patient was advised to go to urgent care with [facility] but elected to come here instead.  Advised patient that there is no need to CT scan his lumbar spine in the emergent setting as he reports no new trauma to his low back.  He denies any red flag symptoms of low back pain or cauda equina syndrome to include groin numbness, distal weakness of extremities, bladder incontinence so I see no need for MRI.  At this time, we will provide patient Lidoderm patch as well as Toradol shot.  Will have patient follow-up with PCP or proceed to urgent care tomorrow to Ortho for further management and care.  Will send him home with short course of pain medication, muscle relaxers.  Advised him to return to the ED with any new or worsening symptoms such as the ones stated above.  He is amenable to this plan.  He is agreeable to discharge.  Patient stable to discharge.  Addendum: Patient has allergy to hydrocodone/acetaminophen so prescribe him tramadol.   Final Clinical Impression(s) / ED Diagnoses Final diagnoses:  Chronic right-sided low back pain with right-sided sciatica    Rx / DC Orders ED Discharge Orders          Ordered    cyclobenzaprine (FLEXERIL) 10 MG tablet  2 times daily PRN        12/10/22 1438    traMADol (ULTRAM) 50 MG tablet  Every 6 hours PRN        12/10/22 1438              Faheem Ziemann, [employer]  F, PA-C 12/10/22 1438    Terrilee Files, MD 12/10/22 1800

## 2022-12-10 NOTE — Discharge Instructions (Addendum)
It was a pleasure taking part in your care today.  As we discussed, you most likely have right-sided sciatica.  I am prescribing you pain medication as well as muscle relaxers.  Please do not drive or operate machinery while taking pain medication muscle relaxers.  He may also purchase Salonpas patches which are over-the-counter antibodies to the right side of the low back.  Please proceed to [facility] urgent care for further management and care of your right-sided low back pain.  Please return to the ED with any new symptoms such as weakness of your legs, numbness in your groin, inability to control your bowel or bladder

## 2022-12-10 NOTE — Telephone Encounter (Signed)
  Chief Complaint:right  lower back radiating to leg  Symptoms: severe pain not responding to tx  Frequency: several weeks Pertinent Negatives: Patient denies incontinence or weakness to that leg Disposition: [] ED /[x] Urgent Care (no appt availability in office) / [] Appointment(In office/virtual)/ []  [place] [facility]/ [] Home Care/ [] Refused Recommended Disposition /[] [place] [facility]/ []  Follow-up with PCP Additional Notes: to UC per disposition- Attempted to sign pt up for appt unable to get appt with provider today- sent pt MyChart message of address and phone number  Gave lab results and made apptfor lab work in 6 weeks- advised pt to pick up medication ordered for new dose of levothyroxine.   Reason for Disposition  [1] SEVERE back pain (e.g., excruciating, unable to do any normal activities) AND [2] not improved 2 hours after pain medicine  Answer Assessment - Initial Assessment Questions 1. ONSET: "When did the pain begin?"      11/27/22 before  2. LOCATION: "Where does it hurt?" (upper, mid or lower back)     Lower back  3. SEVERITY: "How bad is the pain?"  (e.g., Scale 1-10; mild, moderate, or severe)   - MILD (1-3): Doesn't interfere with normal activities.    - MODERATE (4-7): Interferes with normal activities or awakens from sleep.    - SEVERE (8-10): Excruciating pain, unable to do any normal activities.      severe 4. PATTERN: "Is the pain constant?" (e.g., yes, no; constant, intermittent)      yes 5. RADIATION: "Does the pain shoot into your legs or somewhere else?"     Side to lower back to side right side down leg    8. MEDICINES: "What have you taken so far for the pain?" (e.g., nothing, acetaminophen, NSAIDS)     Med given  and nothing working  9. NEUROLOGIC SYMPTOMS: "Do you have any weakness, numbness, or problems with bowel/bladder control?"     no 10. OTHER SYMPTOMS: "Do you have any other symptoms?" (e.g., fever, abdomen pain, burning with urination,  blood in urine)       Hurts more with coughing  11. PREGNANCY: "Is there any chance you are pregnant?" "When was your last menstrual period?"       N/a  Protocols used: Back Pain-A-AH

## 2022-12-11 ENCOUNTER — Other Ambulatory Visit: Payer: Self-pay

## 2022-12-14 NOTE — Therapy (Signed)
OUTPATIENT PHYSICAL THERAPY EVALUATION   Patient Name: Ray Garner MRN: [id number] DOB:1956/01/30, 67 y.o., male Today's Date: 12/17/2022  END OF SESSION:  PT End of Session - 12/17/22 0756     Visit Number 1    Number of Visits 7    Date for PT Re-Evaluation 01/28/23    Authorization Type [employer] medicare    Authorization Time Period auth tbd    Progress Note Due on Visit 10    PT Start Time 0800    PT Stop Time 0843    PT Time Calculation (min) 43 min    Activity Tolerance Patient tolerated treatment well    Behavior During Therapy [facility] for tasks assessed/performed             Past Medical History:  Diagnosis Date   Anemia    Arthritis    Back pain, lumbosacral 06/30/2013   Blood transfusion without reported diagnosis    Cataract    BEGINNING   DM type 2 (diabetes mellitus, type 2) (HCC) 06/30/2013   Gout    HTN (hypertension) 06/30/2013   Hypertension    Hypothyroid 06/30/2013   Past Surgical History:  Procedure Laterality Date   CYST EXCISION     FRACTURE SURGERY     Patient Active Problem List   Diagnosis Date Noted   Healthcare maintenance 07/08/2017   B12 deficiency 07/08/2017   Carpal tunnel syndrome 08/01/2015   DM type 2 (diabetes mellitus, type 2) (HCC) 06/30/2013   HTN (hypertension) 06/30/2013   Gout 06/30/2013   Back pain, lumbosacral 06/30/2013   Testicular cyst s/p surgery 06/30/2013   Hypothyroidism 06/30/2013    PCP: Hoy Register, MD  REFERRING PROVIDER: Hoy Register, MD  REFERRING DIAG: M54.12 (ICD-10-CM) - Cervical radiculopathy M54.31 (ICD-10-CM) - Sciatica of right side  Rationale for Evaluation and Treatment: Rehabilitation  THERAPY DIAG:  Other low back pain  Cervicalgia  Muscle weakness (generalized)  ONSET DATE: a month or so for back  SUBJECTIVE:                                                                                                                                                                                            SUBJECTIVE STATEMENT: Pt states that he fell into a fence last year and that preceded his neck/shoulder pain, describes numbness in RUE. Pt states this has essentially returned to baseline and he would like to focus on his back. Reports about a month ago he had fairly sudden onset of back pain after getting off of his lawnmower, unsure if he twisted the wrong way but doesn't recall any specific injury. Pt states his  pain seems better the past few days which he attributes to medication. Tends to exercise daily; walking around, stretching, jumping jacks   PERTINENT HISTORY:  anemia, DM2, gout, HTN, hypothyroidism Pt also reports remote history of BIL ankle fractures and R knee surgery  PAIN:  Are you having pain: none Location/description: Right low back and upper hip, when worst refers down into RLE; describes as tingling Best-worst over past week: 0-9/10; tends to   - aggravating factors: exercise (stretching, jumping jacks), sitting on lower surfaces or for prolonged periods, bending forward - Easing factors: hot tub, epsom salt, medication, rest   PRECAUTIONS: None   WEIGHT BEARING RESTRICTIONS: No  FALLS:  Has patient fallen in last 6 months? 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  DIAGNOSTIC FINDINGS:  No recent imaging in chart - pt states he got XR, pt denies any fractures, states it showed some narrowing of disc  PATIENT SURVEYS:  FOTO lumbar 83 current 87 predicted; cervical 79 current 85 predicted  SCREENING FOR RED FLAGS: Pt denies: bowel/bladder changes, BIL LE symptoms, buckling of LE, saddle  anesthesia, fever/chills/night sweats  COGNITION: Overall cognitive status: Within functional limits for tasks assessed   SENSATION: Light touch intact BIL LE - pt does endorse chronic history of diabetic neuropathy   POSTURE: mild forward head posture  PALPATION: Deferred   LUMBAR ROM:   AROM eval  Flexion BIL ankles painless  Extension 100% painless   Right lateral flexion 100% *  Left lateral flexion 100%  Right rotation 100% *  Left rotation 100% *   (Blank rows = not tested) (Key: WFL = within functional limits not formally assessed, * = concordant pain, s = stiffness/stretching sensation, NT = not tested)   RANGE OF MOTION:     Active  Right eval Left eval  Shoulder flexion    Functional ER combo    Functional IR combo    Knee extension    Ankle dorsiflexion     (Blank rows = not tested) (Key: WFL = within functional limits not formally assessed, * = concordant pain, s = stiffness/stretching sensation, NT = not tested)  Comments:    STRENGTH TESTING:  MMT Right eval Left eval  Shoulder flexion    Shoulder abduction    Elbow flexion    Elbow extension    Grip strength (gross)    Hip flexion 4- 4+  Hip abduction (modified sitting) 5 5  Knee flexion 5 5  Knee extension 5 5  Ankle dorsiflexion 5 5  Ankle plantarflexion     (Blank rows = not tested) (Key: WFL = within functional limits not formally assessed, * = concordant pain, s = stiffness/stretching sensation, NT = not tested)  Comments:    LUMBAR SPECIAL TESTS:  Slump test RLE partial positive (pulling) compared to LLE   FUNCTIONAL TESTS:  5xSTS: 17sec gentle UE support from standard chair   GAIT: Distance walked: within clinic Assistive device utilized: None Level of assistance: Complete Independence Comments: mildly reduced gait speed/cadence and reduced truncal rotation  TODAY'S TREATMENT:  [facility] Adult PT Treatment:                                                DATE: 12/17/22 Therapeutic Exercise: LTR x5 BIL Hooklying march x10 BIL HEP handout + education, relevant anatomy/physiology and rationale for interventions   PATIENT EDUCATION:  Education details: Pt education on PT impairments, prognosis, and POC. Informed consent. Rationale for interventions, safe/appropriate HEP performance Person educated: Patient Education method: Explanation, Demonstration, Tactile cues, Verbal cues, and Handouts Education comprehension: verbalized understanding, returned demonstration, verbal cues required, tactile cues required, and needs further education    HOME EXERCISE PROGRAM: Access Code: [email or web address] URL: [email or web address] Date: 12/17/2022 Prepared by: Fransisco Hertz  Exercises - Supine Lower Trunk Rotation  - 2-3 x daily - 7 x weekly - 1 sets - 5 reps - Supine March  - 2-3 x daily - 7 x weekly - 1 sets - 10 reps  ASSESSMENT:  CLINICAL IMPRESSION: Patient is a 67 y.o. gentleman who was seen today for physical therapy evaluation and treatment for neck and back pain. He states neck/shoulder symptoms have essentially resolved and he would prefer to focus on back pain. On exam he demonstrates good lumbar ROM although it is mildly painful, mild R hip flexor weakness and equivocally positive slump test on R. 5xSTS time also indicative of reduced mobility and fall risk. Pt tolerates exam/HEP well, no adverse events. Recommend trial of skilled PT to address aforementioned deficits with aim of improving functional tolerance and reducing pain with typical activities. Pt departs today's session in no acute distress, all voiced concerns/questions addressed appropriately from PT perspective.      OBJECTIVE IMPAIRMENTS: decreased activity tolerance, decreased endurance, decreased mobility, decreased strength, and pain.   ACTIVITY LIMITATIONS: carrying,  lifting, bending, and squatting  PARTICIPATION LIMITATIONS: community activity and occupation  PERSONAL FACTORS: Age, Time since onset of injury/illness/exacerbation, and 3+ comorbidities: DM2, gout, HTN  are also affecting patient's functional outcome.   REHAB POTENTIAL: Good  CLINICAL DECISION MAKING: Stable/uncomplicated  EVALUATION COMPLEXITY: Low   GOALS: Goals reviewed with patient? Yes  SHORT TERM GOALS: Target date: 01/07/2023 Pt will demonstrate appropriate understanding and performance of initially prescribed HEP in order to facilitate improved independence with management of symptoms.  Baseline: HEP provided on eval Goal status: INITIAL   2. Pt will report at least 25% decrease in overall pain levels in past week in order to facilitate improved tolerance to basic ADLs/mobility.   Baseline: 0-9/10  Goal status: INITIAL     LONG TERM GOALS: Target date: 01/28/2023 Pt will demonstrate appropriate performance of final prescribed HEP in order to facilitate improved self-management of symptoms post-discharge.   Baseline: initial HEP prescribed  Goal status: INITIAL    2.  Pt will demonstrate symmetrical and painless lumbar rotation AROM in order to demonstrate improved tolerance to functional movement patterns.   Baseline: see ROM chart above Goal status: INITIAL  3.  Pt will demonstrate hip flexor MMT of 4+/5 bilaterally in order to demonstrate improved strength for functional movements.  Baseline: see MMT chart above Goal status: INITIAL  4. Pt will perform 5xSTS in <12 sec in order to demonstrate reduced fall risk and improved functional independence. (MCID of 2.3sec)  Baseline: 17sec w UE support  Goal status: INITIAL   5. Pt will report at least 50% decrease in overall pain levels  in past week in order to facilitate improved tolerance to basic ADLs/mobility.   Baseline: 0-9/10  Goal status: INITIAL    PLAN:  PT FREQUENCY: 1x/week  PT DURATION: 6  weeks  PLANNED INTERVENTIONS: 97164- PT Re-evaluation, 97110-Therapeutic exercises, 97530- Therapeutic activity, 97112- Neuromuscular re-education, 97535- Self Care, 16109- Manual therapy, [phone]- Gait training, [phone]- Aquatic Therapy, Patient/Family education, Balance training, Stair training, Taping, Dry Needling, Joint mobilization, Spinal mobilization, Cryotherapy, and Moist heat.  PLAN FOR NEXT SESSION: Review/update HEP PRN. Work on [employer] exercises as appropriate with emphasis on R QL/lumbar paraspinal mobility and core activation, hip flexor strengthening. Symptom modification strategies as indicated/appropriate.    Ashley Murrain PT, DPT 12/17/2022 11:54 AM    Referring diagnosis? M54.12 (ICD-10-CM) - Cervical radiculopathy M54.31 (ICD-10-CM) - Sciatica of right side  Treatment diagnosis? (if different than referring diagnosis)  Other low back pain   Cervicalgia   Muscle weakness (generalized)   What was this (referring dx) caused by? []  Surgery []  Fall [x]  Ongoing issue []  Arthritis []  Other: ____________  Laterality: [x]  Rt []  Lt []  Both  Check all possible CPT codes:  *CHOOSE 10 OR LESS*    []  97110 (Therapeutic Exercise)  []  92507 (SLP Treatment)  []  97112 (Neuro Re-ed)   []  92526 (Swallowing Treatment)   []  97116 (Gait Training)   []  91478 (Cognitive Training, 1st 15 minutes) []  97140 (Manual Therapy)   []  97130 (Cognitive Training, each add'l 15 minutes)  []  97164 (Re-evaluation)                              []  Other, List CPT Code ____________  []  97530 (Therapeutic Activities)     []  97535 (Self Care)   [x]  All codes above (97110 - 97535)  []  97012 (Mechanical Traction)  []  97014 (E-stim Unattended)  []  97032 (E-stim manual)  []  97033 (Ionto)  []  97035 (Ultrasound) []  97750 (Physical Performance Training) [x]  [id number] (Aquatic Therapy) []  97016 (Vasopneumatic Device) []  [id number] (Paraffin) []  97034 (Contrast Bath) []  97597 (Wound Care 1st 20 sq  cm) []  97598 (Wound Care each add'l 20 sq cm) []  97760 (Orthotic Fabrication, Fitting, Training Initial) []  [id number] (Prosthetic Management and Training Initial) []  [id number] (Orthotic or Prosthetic Training/ Modification Subsequent)

## 2022-12-17 ENCOUNTER — Encounter: Payer: Self-pay | Admitting: Physical Therapy

## 2022-12-17 ENCOUNTER — Ambulatory Visit: Payer: Medicare HMO | Attending: Family Medicine | Admitting: Physical Therapy

## 2022-12-17 ENCOUNTER — Other Ambulatory Visit: Payer: Self-pay

## 2022-12-17 DIAGNOSIS — M5412 Radiculopathy, cervical region: Secondary | ICD-10-CM | POA: Insufficient documentation

## 2022-12-17 DIAGNOSIS — M542 Cervicalgia: Secondary | ICD-10-CM | POA: Diagnosis not present

## 2022-12-17 DIAGNOSIS — M6281 Muscle weakness (generalized): Secondary | ICD-10-CM | POA: Insufficient documentation

## 2022-12-17 DIAGNOSIS — M5431 Sciatica, right side: Secondary | ICD-10-CM | POA: Diagnosis not present

## 2022-12-17 DIAGNOSIS — M5459 Other low back pain: Secondary | ICD-10-CM | POA: Insufficient documentation

## 2023-01-02 ENCOUNTER — Other Ambulatory Visit: Payer: Self-pay

## 2023-01-05 ENCOUNTER — Ambulatory Visit: Payer: Medicare HMO

## 2023-01-12 ENCOUNTER — Ambulatory Visit: Payer: Medicare HMO | Attending: Family Medicine

## 2023-01-12 DIAGNOSIS — M5459 Other low back pain: Secondary | ICD-10-CM | POA: Insufficient documentation

## 2023-01-12 DIAGNOSIS — M542 Cervicalgia: Secondary | ICD-10-CM | POA: Diagnosis not present

## 2023-01-12 DIAGNOSIS — M6281 Muscle weakness (generalized): Secondary | ICD-10-CM | POA: Insufficient documentation

## 2023-01-12 NOTE — Therapy (Signed)
OUTPATIENT PHYSICAL THERAPY TREATMENT NOTE   Patient Name: Ray Garner MRN: [id number] DOB:1955-03-27, 67 y.o., male Today's Date: 01/12/2023  END OF SESSION:  PT End of Session - 01/12/23 0806     Visit Number 2    Number of Visits 7    Date for PT Re-Evaluation 01/28/23    Authorization Type [employer]    Authorization Time Period 6 visits approved 12/31/22-02/16/23    Authorization - Visit Number 1    Authorization - Number of Visits 6    Progress Note Due on Visit 10    PT Start Time 0815    PT Stop Time 0855    PT Time Calculation (min) 40 min    Activity Tolerance Patient tolerated treatment well    Behavior During Therapy [facility] for tasks assessed/performed              Past Medical History:  Diagnosis Date   Anemia    Arthritis    Back pain, lumbosacral 06/30/2013   Blood transfusion without reported diagnosis    Cataract    BEGINNING   DM type 2 (diabetes mellitus, type 2) (HCC) 06/30/2013   Gout    HTN (hypertension) 06/30/2013   Hypertension    Hypothyroid 06/30/2013   Past Surgical History:  Procedure Laterality Date   CYST EXCISION     FRACTURE SURGERY     Patient Active Problem List   Diagnosis Date Noted   Healthcare maintenance 07/08/2017   B12 deficiency 07/08/2017   Carpal tunnel syndrome 08/01/2015   DM type 2 (diabetes mellitus, type 2) (HCC) 06/30/2013   HTN (hypertension) 06/30/2013   Gout 06/30/2013   Back pain, lumbosacral 06/30/2013   Testicular cyst s/p surgery 06/30/2013   Hypothyroidism 06/30/2013    PCP: Hoy Register, MD  REFERRING PROVIDER: Hoy Register, MD  REFERRING DIAG: M54.12 (ICD-10-CM) - Cervical radiculopathy M54.31 (ICD-10-CM) - Sciatica of right side  Rationale for Evaluation and Treatment: Rehabilitation  THERAPY DIAG:  Other low back pain  Cervicalgia  Muscle weakness (generalized)  ONSET DATE: a month or so for back  SUBJECTIVE:                                                                                                                                                                                            SUBJECTIVE STATEMENT: Patient reports pain mostly in his Rt shoulder and Rt side of neck and that his lower back pain has been better.    PERTINENT HISTORY:  anemia, DM2, gout, HTN, hypothyroidism Pt also reports remote history of BIL ankle fractures and R knee surgery  PAIN:  Are you  having pain: none Location/description: Right low back and upper hip, when worst refers down into RLE; describes as tingling Best-worst over past week: 0-9/10; tends to   - aggravating factors: exercise (stretching, jumping jacks), sitting on lower surfaces or for prolonged periods, bending forward - Easing factors: hot tub, epsom salt, medication, rest   PRECAUTIONS: None   WEIGHT BEARING RESTRICTIONS: No  FALLS:  Has patient fallen in last 6 months? 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  DIAGNOSTIC FINDINGS:  No recent imaging in chart - pt states he got XR, pt denies any fractures, states it showed some narrowing of disc  PATIENT SURVEYS:  FOTO lumbar 83 current 87 predicted; cervical 79 current 85 predicted  SCREENING FOR RED FLAGS: Pt denies: bowel/bladder changes, BIL LE symptoms, buckling of LE, saddle anesthesia, fever/chills/night sweats  COGNITION: Overall cognitive status: Within functional limits for tasks assessed   SENSATION: Light touch intact BIL LE - pt does endorse chronic history of diabetic neuropathy   POSTURE: mild forward head posture  PALPATION: Deferred   LUMBAR ROM:   AROM eval   Flexion BIL ankles painless  Extension 100% painless   Right lateral flexion 100% *  Left lateral flexion 100%  Right rotation 100% *  Left rotation 100% *   (Blank rows = not tested) (Key: WFL = within functional limits not formally assessed, * = concordant pain, s = stiffness/stretching sensation, NT = not tested)   RANGE OF MOTION:     Active  Right eval Left eval  Shoulder flexion    Functional ER combo    Functional IR combo    Knee extension    Ankle dorsiflexion     (Blank rows = not tested) (Key: WFL = within functional limits not formally assessed, * = concordant pain, s = stiffness/stretching sensation, NT = not tested)  Comments:    STRENGTH TESTING:  MMT Right eval Left eval  Shoulder flexion    Shoulder abduction    Elbow flexion    Elbow extension    Grip strength (gross)    Hip flexion 4- 4+  Hip abduction (modified sitting) 5 5  Knee flexion 5 5  Knee extension 5 5  Ankle dorsiflexion 5 5  Ankle plantarflexion     (Blank rows = not tested) (Key: WFL = within functional limits not formally assessed, * = concordant pain, s = stiffness/stretching sensation, NT = not tested)  Comments:    LUMBAR SPECIAL TESTS:  Slump test RLE partial positive (pulling) compared to LLE   FUNCTIONAL TESTS:  5xSTS: 17sec gentle UE support from standard chair   GAIT: Distance walked: within clinic Assistive device utilized: None Level of assistance: Complete Independence Comments: mildly reduced gait speed/cadence and reduced truncal rotation  TODAY'S TREATMENT:            [facility] Adult PT Treatment:                                                DATE:  01/12/23 Therapeutic Exercise: Nustep level 5 x 5 mins while gathering subjective and planning session with pt Palloff press 10# 2x10 BIL Rows BlueTB 2x10 Shoulder extension BlueTB 2x10 Seated hamstring stretch 2x30" BIL Seated BIL ER with scap retraction GTB x10 PPT 3" hold 2x10 PPT with alternating marching  3x30" Bridges 2x10 Supine figure 4 stretch x30" BIL Supine QL stretch x30" BIL Modified thomas stretch EOM x1' BIL STS x10 - arms crossed, x10 wth 10# KB Seated pball roll outs fwd/lat x10 ea                                                                                                                     [facility] Adult PT Treatment:                                                DATE: 12/17/22 Therapeutic Exercise: LTR x5 BIL Hooklying march x10 BIL HEP handout + education, relevant anatomy/physiology and rationale for interventions   PATIENT EDUCATION:  Education details: Pt education on PT impairments, prognosis, and POC. Informed consent. Rationale for interventions, safe/appropriate HEP performance Person educated: Patient Education method: Explanation, Demonstration, Tactile cues, Verbal cues, and Handouts Education comprehension: verbalized understanding, returned demonstration, verbal cues required, tactile cues required, and needs further education    HOME EXERCISE PROGRAM: Access Code: [email or web address] URL: [email or web address] Date: 12/17/2022 Prepared by: Fransisco Hertz  Exercises - Supine Lower Trunk Rotation  - 2-3 x daily - 7 x weekly - 1 sets - 5 reps - Supine March  - 2-3 x daily - 7 x weekly - 1 sets - 10 reps  ASSESSMENT:  CLINICAL IMPRESSION: Patient presents to first follow up PT session reporting continued Rt sided shoulder and neck pain and that his lower back pain has been improving. He reports HEP compliance since evaluation. Session today focused on proximal hip, core, and periscapular strengthening. Patient was able to tolerate all prescribed exercises with no adverse effects. Patient continues to benefit from skilled PT services and should be progressed as able to improve functional independence.    OBJECTIVE IMPAIRMENTS: decreased activity tolerance, decreased endurance, decreased mobility, decreased strength, and pain.   ACTIVITY LIMITATIONS:  carrying, lifting, bending, and squatting  PARTICIPATION LIMITATIONS: community activity and occupation  PERSONAL FACTORS: Age, Time since onset of injury/illness/exacerbation, and 3+ comorbidities: DM2, gout, HTN  are also affecting patient's functional outcome.   REHAB POTENTIAL: Good  CLINICAL DECISION MAKING: Stable/uncomplicated  EVALUATION COMPLEXITY: Low   GOALS: Goals reviewed with patient? Yes  SHORT TERM GOALS: Target date: 01/07/2023 Pt will demonstrate appropriate understanding and performance of initially prescribed HEP in order to facilitate improved independence with management of symptoms.  Baseline: HEP provided on eval Goal status: INITIAL   2. Pt will report at least 25% decrease in overall pain levels in past week in order to facilitate improved tolerance to basic ADLs/mobility.  Baseline: 0-9/10  Goal status: INITIAL     LONG TERM GOALS: Target date: 01/28/2023 Pt will demonstrate appropriate performance of final prescribed HEP in order to facilitate improved self-management of symptoms post-discharge.   Baseline: initial HEP prescribed  Goal status: INITIAL    2.  Pt will demonstrate symmetrical and painless lumbar rotation AROM in order to demonstrate improved tolerance to functional movement patterns.   Baseline: see ROM chart above Goal status: INITIAL  3.  Pt will demonstrate hip flexor MMT of 4+/5 bilaterally in order to demonstrate improved strength for functional movements.  Baseline: see MMT chart above Goal status: INITIAL  4. Pt will perform 5xSTS in <12 sec in order to demonstrate reduced fall risk and improved functional independence. (MCID of 2.3sec)  Baseline: 17sec w UE support  Goal status: INITIAL   5. Pt will report at least 50% decrease in overall pain levels in past week in order to facilitate improved tolerance to basic ADLs/mobility.   Baseline: 0-9/10  Goal status: INITIAL    PLAN:  PT FREQUENCY: 1x/week  PT DURATION: 6  weeks  PLANNED INTERVENTIONS: 97164- PT Re-evaluation, 97110-Therapeutic exercises, 97530- Therapeutic activity, 97112- Neuromuscular re-education, 97535- Self Care, 16109- Manual therapy, [phone]- Gait training, [phone]- Aquatic Therapy, Patient/Family education, Balance training, Stair training, Taping, Dry Needling, Joint mobilization, Spinal mobilization, Cryotherapy, and Moist heat.  PLAN FOR NEXT SESSION: Review/update HEP PRN. Work on [employer] exercises as appropriate with emphasis on R QL/lumbar paraspinal mobility and core activation, hip flexor strengthening. Symptom modification strategies as indicated/appropriate.    Berta Minor PTA 01/12/2023 8:54 AM

## 2023-01-19 ENCOUNTER — Ambulatory Visit: Payer: Medicare HMO

## 2023-01-19 DIAGNOSIS — M6281 Muscle weakness (generalized): Secondary | ICD-10-CM

## 2023-01-19 DIAGNOSIS — M542 Cervicalgia: Secondary | ICD-10-CM | POA: Diagnosis not present

## 2023-01-19 DIAGNOSIS — M5459 Other low back pain: Secondary | ICD-10-CM | POA: Diagnosis not present

## 2023-01-19 NOTE — Therapy (Addendum)
OUTPATIENT PHYSICAL THERAPY TREATMENT NOTE + DISCHARGE SUMMARY (see below)   Patient Name: Ray Garner MRN: [id number] DOB:1955/10/12, 67 y.o., male Today's Date: 01/19/2023  END OF SESSION:  PT End of Session - 01/19/23 0806     Visit Number 3    Number of Visits 7    Date for PT Re-Evaluation 01/28/23    Authorization Type [employer]    Authorization Time Period 6 visits approved 12/31/22-02/16/23    Authorization - Visit Number 2    Authorization - Number of Visits 6    Progress Note Due on Visit 10    PT Start Time 0815    PT Stop Time 0843    PT Time Calculation (min) 28 min    Activity Tolerance Patient tolerated treatment well    Behavior During Therapy [facility] for tasks assessed/performed             Past Medical History:  Diagnosis Date   Anemia    Arthritis    Back pain, lumbosacral 06/30/2013   Blood transfusion without reported diagnosis    Cataract    BEGINNING   DM type 2 (diabetes mellitus, type 2) (HCC) 06/30/2013   Gout    HTN (hypertension) 06/30/2013   Hypertension    Hypothyroid 06/30/2013   Past Surgical History:  Procedure Laterality Date   CYST EXCISION     FRACTURE SURGERY     Patient Active Problem List   Diagnosis Date Noted   Healthcare maintenance 07/08/2017   B12 deficiency 07/08/2017   Carpal tunnel syndrome 08/01/2015   DM type 2 (diabetes mellitus, type 2) (HCC) 06/30/2013   HTN (hypertension) 06/30/2013   Gout 06/30/2013   Back pain, lumbosacral 06/30/2013   Testicular cyst s/p surgery 06/30/2013   Hypothyroidism 06/30/2013    PCP: Hoy Register, MD  REFERRING PROVIDER: Hoy Register, MD  REFERRING DIAG: M54.12 (ICD-10-CM) - Cervical radiculopathy M54.31 (ICD-10-CM) - Sciatica of right side  Rationale for Evaluation and Treatment: Rehabilitation  THERAPY DIAG:  Other low back pain  Cervicalgia  Muscle weakness (generalized)  ONSET DATE: a month or so for back  SUBJECTIVE:                                                                                                                                                                                            SUBJECTIVE STATEMENT: Patient reports that he doesn't have any pain this morning. He reports a max of 4/10 pain this week in his neck/Rt shoulder.    PERTINENT HISTORY:  anemia, DM2, gout, HTN, hypothyroidism Pt also reports remote history of BIL ankle fractures and R knee surgery  PAIN:  Are you having pain: none Location/description: Right low back and upper hip, when worst refers down into RLE; describes as tingling Best-worst over past week: 0-9/10; tends to   - aggravating factors: exercise (stretching, jumping jacks), sitting on lower surfaces or for prolonged periods, bending forward - Easing factors: hot tub, epsom salt, medication, rest   PRECAUTIONS: None   WEIGHT BEARING RESTRICTIONS: No  FALLS:  Has patient fallen in last 6 months? 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  DIAGNOSTIC FINDINGS:  No recent imaging in chart - pt states he got XR, pt denies any fractures, states it showed some narrowing of disc  PATIENT SURVEYS:  FOTO lumbar 83 current 87 predicted; cervical 79 current 85 predicted  SCREENING FOR RED FLAGS: Pt denies: bowel/bladder changes, BIL LE symptoms, buckling of LE, saddle anesthesia, fever/chills/night sweats  COGNITION: Overall cognitive status: Within functional limits for tasks assessed   SENSATION: Light touch intact BIL LE - pt does endorse chronic history of diabetic neuropathy   POSTURE: mild forward head  posture  PALPATION: Deferred   LUMBAR ROM:   AROM eval  Flexion BIL ankles painless  Extension 100% painless   Right lateral flexion 100% *  Left lateral flexion 100%  Right rotation 100% *  Left rotation 100% *   (Blank rows = not tested) (Key: WFL = within functional limits not formally assessed, * = concordant pain, s = stiffness/stretching sensation, NT = not tested)   RANGE OF MOTION:     Active  Right eval Left eval  Shoulder flexion    Functional ER combo    Functional IR combo    Knee extension    Ankle dorsiflexion     (Blank rows = not tested) (Key: WFL = within functional limits not formally assessed, * = concordant pain, s = stiffness/stretching sensation, NT = not tested)  Comments:    STRENGTH TESTING:  MMT Right eval Left eval Right 01/19/23 Left 01/19/23  Shoulder flexion      Shoulder abduction      Elbow flexion      Elbow extension      Grip strength (gross)      Hip flexion 4- 4+ 4+ 4+  Hip abduction (modified sitting) 5 5    Knee flexion 5 5    Knee extension 5 5    Ankle dorsiflexion 5 5    Ankle plantarflexion       (Blank rows = not tested) (Key: WFL = within functional limits not formally assessed, * = concordant pain, s = stiffness/stretching sensation, NT = not tested)  Comments:    LUMBAR SPECIAL TESTS:  Slump test RLE partial positive (pulling) compared to LLE   FUNCTIONAL TESTS:  5xSTS: 17sec gentle UE support from standard chair  01/19/23: 12 seconds no UE support  GAIT: Distance walked: within clinic Assistive device utilized: None Level of assistance: Complete Independence Comments: mildly reduced gait speed/cadence and reduced truncal rotation  TODAY'S TREATMENT:      [facility] Adult PT Treatment:  DATE: 01/19/23 Therapeutic Exercise: Nustep level 5 x 5 mins while gathering subjective and planning session with pt Palloff press 10# 2x10 BIL Rows BlueTB 2x10 Shoulder  extension BlueTB 2x10 Seated hamstring stretch 2x30" BIL Seated BIL ER with scap retraction GTB x10 Therapeutic Activity: MMT, 5xSTS, discussion of goals and review of HEP         [facility] Adult PT Treatment:                                                DATE: 01/12/23 Therapeutic Exercise: Nustep level 5 x 5 mins while gathering subjective and planning session with pt Palloff press 10# 2x10 BIL Rows BlueTB 2x10 Shoulder extension BlueTB 2x10 Seated hamstring stretch 2x30" BIL Seated BIL ER with scap retraction GTB x10 PPT 3" hold 2x10 PPT with alternating marching 3x30" Bridges 2x10 Supine figure 4 stretch x30" BIL Supine QL stretch x30" BIL Modified thomas stretch EOM x1' BIL STS x10 - arms crossed, x10 wth 10# KB Seated pball roll outs fwd/lat x10 ea                                                                                                                     [facility] Adult PT Treatment:                                                DATE: 12/17/22 Therapeutic Exercise: LTR x5 BIL Hooklying march x10 BIL HEP handout + education, relevant anatomy/physiology and rationale for interventions   PATIENT EDUCATION:  Education details: Pt education on PT impairments, prognosis, and POC. Informed consent. Rationale for interventions, safe/appropriate HEP performance Person educated: Patient Education method: Explanation, Demonstration, Tactile cues, Verbal cues, and Handouts Education comprehension: verbalized understanding, returned demonstration, verbal cues required, tactile cues required, and needs further education    HOME EXERCISE PROGRAM: Access Code: [email or web address] URL: [email or web address] Date: 01/19/2023 Prepared by: Berta Minor  Exercises - Supine Lower Trunk Rotation  - 2-3 x daily - 7 x weekly - 1 sets - 5 reps - Supine March  - 2-3 x daily - 7 x weekly - 1 sets - 10 reps - Supine Bridge  - 1 x daily - 7 x weekly - 3 sets - 10 reps - Seated  Hamstring Stretch  - 1 x daily - 7 x weekly - 3 sets - 30 sec hold - Shoulder External Rotation and Scapular Retraction with Resistance  - 1 x daily - 7 x weekly - 3 sets - 10 reps  ASSESSMENT:  CLINICAL IMPRESSION: Patient presents to PT reporting no current pain and that his max pain over the past week was 4/10 in his neck/rt shoulder. He is interested in DC from PT. Reviewed goals and patient  has met all goals with decreased pain, improved MMT, and feels like he can manage his symptoms going forward. Patient is appropriate for DC from PT at this time.   OBJECTIVE IMPAIRMENTS: decreased activity tolerance, decreased endurance, decreased mobility, decreased strength, and pain.   ACTIVITY LIMITATIONS: carrying, lifting, bending, and squatting  PARTICIPATION LIMITATIONS: community activity and occupation  PERSONAL FACTORS: Age, Time since onset of injury/illness/exacerbation, and 3+ comorbidities: DM2, gout, HTN  are also affecting patient's functional outcome.   REHAB POTENTIAL: Good  CLINICAL DECISION MAKING: Stable/uncomplicated  EVALUATION COMPLEXITY: Low   GOALS: Goals reviewed with patient? Yes  SHORT TERM GOALS: Target date: 01/07/2023 Pt will demonstrate appropriate understanding and performance of initially prescribed HEP in order to facilitate improved independence with management of symptoms.  Baseline: HEP provided on eval Goal status: MET   2. Pt will report at least 25% decrease in overall pain levels in past week in order to facilitate improved tolerance to basic ADLs/mobility.   Baseline: 0-9/10  Goal status: MET 01/19/23: max pain 4/10    LONG TERM GOALS: Target date: 01/28/2023 Pt will demonstrate appropriate performance of final prescribed HEP in order to facilitate improved self-management of symptoms post-discharge.   Baseline: initial HEP prescribed  Goal status: MET    2.  Pt will demonstrate symmetrical and painless lumbar rotation AROM in order to  demonstrate improved tolerance to functional movement patterns.   Baseline: see ROM chart above Goal status: MET 01/19/23: Symmetrical and no pain   3.  Pt will demonstrate hip flexor MMT of 4+/5 bilaterally in order to demonstrate improved strength for functional movements.  Baseline: see MMT chart above Goal status: MET  4. Pt will perform 5xSTS in <12 sec in order to demonstrate reduced fall risk and improved functional independence. (MCID of 2.3sec)  Baseline: 17sec w UE support  Goal status: MET 01/19/23: 12 seconds no UE  5. Pt will report at least 50% decrease in overall pain levels in past week in order to facilitate improved tolerance to basic ADLs/mobility.   Baseline: 0-9/10  Goal status: MET 01/19/23: 4/10 max pain     PLAN:  PT FREQUENCY: 1x/week  PT DURATION: 6 weeks  PLANNED INTERVENTIONS: 97164- PT Re-evaluation, 97110-Therapeutic exercises, 97530- Therapeutic activity, 97112- Neuromuscular re-education, 97535- Self Care, 96045- Manual therapy, [phone]- Gait training, [phone]- Aquatic Therapy, Patient/Family education, Balance training, Stair training, Taping, Dry Needling, Joint mobilization, Spinal mobilization, Cryotherapy, and Moist heat.  PLAN FOR NEXT SESSION: DC   Berta Minor PTA 01/19/2023 8:45 AM     Discharge addendum 02/19/2023  PHYSICAL THERAPY DISCHARGE SUMMARY  Visits from Start of Care: 3  Current functional level related to goals / functional outcomes: See above for most recent assessment   Remaining deficits: See above for most recent assessment   Education / Equipment: Education as above  Patient goals were met. Patient is being discharged due to being pleased with the current functional level, meeting rehab goals.   Ashley Murrain PT, DPT 02/19/2023 11:15 AM

## 2023-01-21 ENCOUNTER — Other Ambulatory Visit: Payer: Medicare HMO

## 2023-02-04 ENCOUNTER — Other Ambulatory Visit: Payer: Self-pay

## 2023-02-05 ENCOUNTER — Other Ambulatory Visit: Payer: Self-pay

## 2023-02-28 ENCOUNTER — Other Ambulatory Visit: Payer: Self-pay

## 2023-03-01 ENCOUNTER — Other Ambulatory Visit: Payer: Self-pay

## 2023-03-04 ENCOUNTER — Ambulatory Visit: Payer: Medicare PPO | Attending: Family Medicine | Admitting: Family Medicine

## 2023-03-04 ENCOUNTER — Other Ambulatory Visit: Payer: Self-pay

## 2023-03-04 ENCOUNTER — Encounter: Payer: Self-pay | Admitting: Family Medicine

## 2023-03-04 VITALS — BP 167/83 | HR 79 | Ht 72.0 in | Wt 277.0 lb

## 2023-03-04 DIAGNOSIS — Z7985 Long-term (current) use of injectable non-insulin antidiabetic drugs: Secondary | ICD-10-CM | POA: Diagnosis not present

## 2023-03-04 DIAGNOSIS — N529 Male erectile dysfunction, unspecified: Secondary | ICD-10-CM | POA: Diagnosis not present

## 2023-03-04 DIAGNOSIS — M1A9XX0 Chronic gout, unspecified, without tophus (tophi): Secondary | ICD-10-CM

## 2023-03-04 DIAGNOSIS — Z7984 Long term (current) use of oral hypoglycemic drugs: Secondary | ICD-10-CM | POA: Diagnosis not present

## 2023-03-04 DIAGNOSIS — E039 Hypothyroidism, unspecified: Secondary | ICD-10-CM

## 2023-03-04 DIAGNOSIS — Z794 Long term (current) use of insulin: Secondary | ICD-10-CM | POA: Diagnosis not present

## 2023-03-04 DIAGNOSIS — E1159 Type 2 diabetes mellitus with other circulatory complications: Secondary | ICD-10-CM

## 2023-03-04 DIAGNOSIS — E1165 Type 2 diabetes mellitus with hyperglycemia: Secondary | ICD-10-CM

## 2023-03-04 DIAGNOSIS — I152 Hypertension secondary to endocrine disorders: Secondary | ICD-10-CM

## 2023-03-04 LAB — POCT GLYCOSYLATED HEMOGLOBIN (HGB A1C): HbA1c, POC (controlled diabetic range): 8.2 % — AB (ref 0.0–7.0)

## 2023-03-04 MED ORDER — SILDENAFIL CITRATE 100 MG PO TABS
100.0000 mg | ORAL_TABLET | Freq: Every day | ORAL | 2 refills | Status: AC | PRN
  Filled 2023-03-04: qty 15, 15d supply, fill #0

## 2023-03-04 MED ORDER — TRULICITY 4.5 MG/0.5ML ~~LOC~~ SOAJ
4.5000 mg | SUBCUTANEOUS | 3 refills | Status: DC
  Filled 2023-03-04: qty 2, 28d supply, fill #0
  Filled 2023-04-29: qty 2, 28d supply, fill #1
  Filled 2023-05-24: qty 2, 28d supply, fill #2
  Filled 2023-07-08: qty 2, 28d supply, fill #3

## 2023-03-04 NOTE — Patient Instructions (Signed)
 VISIT SUMMARY:  During today's visit, we discussed your elevated blood pressure, diabetes management, hypothyroidism, and erectile dysfunction. We also reviewed your recent eye and foot exams and general health maintenance.  YOUR PLAN:  -HYPERTENSION: Hypertension means high blood pressure. Your blood pressure was elevated today, but you took your medication shortly before the visit. We will recheck your blood pressure in one month. Please continue your current medication regimen.  -TYPE 2 DIABETES MELLITUS: Type 2 Diabetes Mellitus is a condition where your body does not use insulin  properly, leading to high blood sugar levels. Your HbA1c has increased to 8.2, indicating that your blood sugar control has worsened. We will increase your Lantus  to 50 units daily, but if you experience low blood sugar, reduce it back to 48 units. We will also increase your Trulicity  to 4.5mg  weekly. Please try to maintain a consistent diet and regular meal times to prevent low blood sugar. We will order labs to check your kidney and liver function.  -HYPOTHYROIDISM: Hypothyroidism is a condition where your thyroid  gland does not produce enough thyroid  hormone. Your last thyroid  level was abnormal, so we will recheck it. Please continue your current thyroid  medication regimen.  -ERECTILE DYSFUNCTION: Erectile Dysfunction is the inability to get or keep an erection firm enough for sex. You reported that Viagra  and Cialis were not effective. We will increase your Viagra  dose to 100mg  as needed. If this is still ineffective, we may refer you to a Urologist.  -FOOT EXAM: Your foot exam showed no abnormalities. Please continue regular foot care and exams.  -EYE EXAM: You had an eye exam several months ago. Please continue with regular eye exams.  -GENERAL HEALTH MAINTENANCE: We will order a cholesterol panel during your next fasting lab visit. Please continue with your regular health maintenance  routines.  INSTRUCTIONS:  Please follow up in one month to recheck your blood pressure. We will also need to recheck your thyroid  level and order labs for kidney and liver function. If the increased dose of Viagra  is not effective, we may consider a referral to Urology.

## 2023-03-04 NOTE — Progress Notes (Signed)
 Subjective:  Patient ID: Ray Garner, male    DOB: Jul 05, 1955  Age: 68 y.o. MRN: [id number]  CC: Medical Management of Chronic Issues   HPI Ray Garner is a 68 y.o. year old male with a history of Type 2 diabetes mellitus (A1c 8.2), hypertension, hypothyroidism, Gout   Interval History: Discussed the use of AI scribe software for clinical note transcription with the patient, who gave verbal consent to proceed.  He presents with elevated blood pressure. He confirms adherence to his antihypertensive medication, which was taken earlier in the day.  Blood pressure at his last visit was normal.   He denies recent gout flare-ups.  His diabetes control has been suboptimal, with a recent HbA1c of 8.2, up from 8.0. He reports home glucose readings ranging from 90 to over 200, with occasional readings so low that the meter cannot quantify them. He attributes these fluctuations to inconsistent meal patterns and increased fruit consumption. He is currently on metformin , 48 units of Lantus  (despite a previous recommendation to increase to 50 units), Trulicity , and Jardiance .  The patient also mentions a recent trial of Viagra  and Cialis for sexual dysfunction, which were ineffective. He expresses interest in exploring other treatment options.  In addition, he has hypothyroidism, for which he takes medication first thing in the morning. His last thyroid  level was abnormal, prompting a plan to recheck it. He had an eye exam several months ago and is due for a foot exam.    Past Medical History:  Diagnosis Date   Anemia    Arthritis    Back pain, lumbosacral 06/30/2013   Blood transfusion without reported diagnosis    Cataract    BEGINNING   DM type 2 (diabetes mellitus, type 2) (HCC) 06/30/2013   Gout    HTN (hypertension) 06/30/2013   Hypertension    Hypothyroid 06/30/2013    Past Surgical History:  Procedure Laterality Date   CYST EXCISION     FRACTURE SURGERY      Family History  Problem  Relation Age of Onset   Breast cancer Niece    Colon cancer Neg Hx    Colon polyps Neg Hx    Esophageal cancer Neg Hx    Rectal cancer Neg Hx    Stomach cancer Neg Hx     Social History   Socioeconomic History   Marital status: Divorced    Spouse name: Not on file   Number of children: Not on file   Years of education: Not on file   Highest education level: Not on file  Occupational History   Not on file  Tobacco Use   Smoking status: Former    Current packs/day: 0.00    Types: Cigarettes    Quit date: 05/14/1983    Years since quitting: 39.8   Smokeless tobacco: Former  Building Services Engineer status: Former  Substance and Sexual Activity   Alcohol use: Yes    Comment: OCC.   Drug use: No   Sexual activity: Not on file  Other Topics Concern   Not on file  Social History Narrative   Not on file   Social Drivers of Health   Financial Resource Strain: Low Risk  (03/04/2023)   Overall Financial Resource Strain (CARDIA)    Difficulty of Paying Living Expenses: Not hard at all  Food Insecurity: No Food Insecurity (03/04/2023)   Hunger Vital Sign    Worried About Running Out of Food in the Last Year: Never true  Ran Out of Food in the Last Year: Never true  Transportation Needs: No Transportation Needs (03/04/2023)   PRAPARE - Administrator, Civil Service (Medical): No    Lack of Transportation (Non-Medical): No  Physical Activity: Insufficiently Active (10/03/2022)   Exercise Vital Sign    Days of Exercise per Week: 3 days    Minutes of Exercise per Session: 30 min  Stress: No Stress Concern Present (03/04/2023)   [employer] of Occupational Health - Occupational Stress Questionnaire    Feeling of Stress : Not at all  Social Connections: Moderately Integrated (03/04/2023)   Social Connection and Isolation Panel [NHANES]    Frequency of Communication with Friends and Family: More than three times a week    Frequency of Social Gatherings with Friends and  Family: More than three times a week    Attends Religious Services: More than 4 times per year    Active Member of [employer] or Organizations: No    Attends Engineer, Structural: 1 to 4 times per year    Marital Status: Divorced    Allergies  Allergen Reactions   Vicodin [Hydrocodone -Acetaminophen ] Hives and Rash    Outpatient Medications Prior to Visit  Medication Sig Dispense Refill   allopurinol  (ZYLOPRIM ) 300 MG tablet Take 1 tablet (300 mg total) by mouth daily. 90 tablet 1   amLODipine  (NORVASC ) 10 MG tablet Take 1 tablet (10 mg total) by mouth daily. 90 tablet 1   atorvastatin  (LIPITOR) 40 MG tablet Take 1 tablet (40 mg total) by mouth daily. 90 tablet 1   Continuous Blood Gluc Receiver (FREESTYLE LIBRE 14 DAY READER) DEVI 1 each by Does not apply route 3 (three) times daily with meals. 1 Device 0   Continuous Blood Gluc Sensor (FREESTYLE LIBRE 14 DAY SENSOR) MISC 1 each by Does not apply route 3 (three) times daily with meals. 1 each 11   cyclobenzaprine  (FLEXERIL ) 10 MG tablet Take 1 tablet (10 mg total) by mouth 2 (two) times daily as needed for muscle spasms. 20 tablet 0   empagliflozin  (JARDIANCE ) 25 MG TABS tablet Take 1 tablet (25 mg total) by mouth daily before breakfast. 90 tablet 1   ergocalciferol  (DRISDOL ) 1.25 MG (50000 UT) capsule Take 1 capsule (50,000 Units total) by mouth once a week. 12 capsule 0   fluticasone  (FLONASE ) 50 MCG/ACT nasal spray Place 2 sprays into both nostrils daily. 16 g 1   gabapentin  (NEURONTIN ) 300 MG capsule Take 1 capsule (300 mg total) by mouth at bedtime. 30 capsule 6   glucose blood (BAYER CONTOUR TEST) test strip Use as instructed 100 each 12   hydrocortisone  1 % ointment Apply 1 application topically 2 (two) times daily. 30 g 1   ibuprofen  (ADVIL ) 800 MG tablet Take 1 tablet (800 mg total) by mouth 3 (three) times daily. 21 tablet 0   insulin  glargine (LANTUS  SOLOSTAR) 100 UNIT/ML Solostar Pen Inject 50 Units into the skin daily. 30  mL 3   Insulin  Pen Needle (TRUEPLUS PEN NEEDLES) 31G X 6 MM MISC USE AS DIRECTED. 100 each 5   levothyroxine  (SYNTHROID ) 200 MCG tablet Take 1 tablet (200 mcg total) by mouth daily before breakfast. 90 tablet 1   meclizine  (ANTIVERT ) 25 MG tablet Take 1 tablet (25 mg total) by mouth 2 (two) times daily as needed for dizziness. 30 tablet 0   meloxicam  (MOBIC ) 7.5 MG tablet Take 1 tablet (7.5 mg total) by mouth 2 (two) times daily. 28 tablet 0  metFORMIN  (GLUCOPHAGE ) 500 MG tablet Take 2 tablets (1,000 mg total) by mouth 2 (two) times daily with a meal. 360 tablet 1   metoCLOPramide  (REGLAN ) 5 MG tablet Take 1 tablet (5 mg total) by mouth 3 (three) times daily before meals. 90 tablet 1   omeprazole  (PRILOSEC) 20 MG capsule Take 1 capsule (20 mg total) by mouth daily. 90 capsule 1   polyethylene glycol (MIRALAX  / GLYCOLAX ) 17 g packet Take 17 g by mouth daily. 14 each 0   Spacer/Aero-Holding Chambers (AEROCHAMBER PLUS) inhaler Use as instructed 1 each 2   tiZANidine  (ZANAFLEX ) 4 MG tablet Take 1 tablet (4 mg total) by mouth every 6 (six) hours as needed for muscle spasms for up to 5 days. 20 tablet 0   traMADol  (ULTRAM ) 50 MG tablet Take 1 tablet (50 mg total) by mouth every 6 (six) hours as needed. 15 tablet 0   vitamin B-12 (CYANOCOBALAMIN) 1000 MCG tablet Take 1,000 mcg by mouth daily.     Dulaglutide  (TRULICITY ) 3 MG/0.5ML SOAJ Inject 3 mg as directed once a week. 6 mL 1   carvedilol  (COREG ) 6.25 MG tablet TAKE 1 TABLET (6.25 MG TOTAL) BY MOUTH 2 (TWO) TIMES DAILY WITH A MEAL. 60 tablet 6   losartan  (COZAAR ) 100 MG tablet Take 1 tablet (100 mg total) by mouth daily. 30 tablet 6   No facility-administered medications prior to visit.     ROS Review of Systems  Constitutional:  Negative for activity change and appetite change.  HENT:  Negative for sinus pressure and sore throat.   Respiratory:  Negative for chest tightness, shortness of breath and wheezing.   Cardiovascular:  Negative for  chest pain and palpitations.  Gastrointestinal:  Negative for abdominal distention, abdominal pain and constipation.  Genitourinary: Negative.   Musculoskeletal: Negative.   Psychiatric/Behavioral:  Negative for behavioral problems and dysphoric mood.     Objective:  BP (!) 167/83   Pulse 79   Ht 6' (1.829 m)   Wt 277 lb (125.6 kg)   SpO2 96%   BMI 37.57 kg/m      03/04/2023    4:32 PM 03/04/2023    3:54 PM 12/10/2022    1:42 PM  BP/Weight  Systolic BP 167 158   Diastolic BP 83 87   Wt. (Lbs)  277 268  BMI  37.57 kg/m2 36.35 kg/m2      Physical Exam Constitutional:      Appearance: He is well-developed.  Cardiovascular:     Rate and Rhythm: Normal rate.     Heart sounds: Normal heart sounds. No murmur heard. Pulmonary:     Effort: Pulmonary effort is normal.     Breath sounds: Normal breath sounds. No wheezing or rales.  Chest:     Chest wall: No tenderness.  Abdominal:     General: Bowel sounds are normal. There is no distension.     Palpations: Abdomen is soft. There is no mass.     Tenderness: There is no abdominal tenderness.  Musculoskeletal:        General: Normal range of motion.     Right lower leg: No edema.     Left lower leg: No edema.  Neurological:     Mental Status: He is alert and oriented to person, place, and time.  Psychiatric:        Mood and Affect: Mood normal.        Latest Ref Rng & Units 03/16/2022    8:36 AM 09/14/2021  6:02 PM 09/11/2020    1:43 PM  CMP  Glucose 70 - 99 mg/dL 832  842  824   BUN 8 - 27 mg/dL 11  34  10   Creatinine 0.76 - 1.27 mg/dL 9.00  8.35  9.09   Sodium 134 - 144 mmol/L 142  136  136   Potassium 3.5 - 5.2 mmol/L 4.6  4.1  4.6   Chloride 96 - 106 mmol/L 106  103  102   CO2 20 - 29 mmol/L 23  24  28    Calcium  8.6 - 10.2 mg/dL 9.2  9.4  9.4   Total Protein 6.0 - 8.5 g/dL 7.0   8.1   Total Bilirubin 0.0 - 1.2 mg/dL 0.2   0.7   Alkaline Phos 44 - 121 IU/L 109   76   AST 0 - 40 IU/L 16   21   ALT 0 - 44  IU/L 19   21     Lipid Panel     Component Value Date/Time   CHOL 102 03/16/2022 0836   TRIG 145 03/16/2022 0836   HDL 31 (L) 03/16/2022 0836   CHOLHDL 3.4 12/16/2019 1140   CHOLHDL 3.6 08/08/2015 0845   VLDL 34 (H) 08/08/2015 0845   LDLCALC 46 03/16/2022 0836    CBC    Component Value Date/Time   WBC 14.1 (H) 09/14/2021 1802   RBC 5.21 09/14/2021 1802   HGB 14.3 09/14/2021 1802   HGB 13.2 08/28/2018 1507   HCT 43.9 09/14/2021 1802   HCT 39.0 08/28/2018 1507   PLT 351 09/14/2021 1802   PLT 274 08/28/2018 1507   MCV 84.3 09/14/2021 1802   MCV 84 08/28/2018 1507   MCH 27.4 09/14/2021 1802   MCHC 32.6 09/14/2021 1802   RDW 13.6 09/14/2021 1802   RDW 13.4 08/28/2018 1507   LYMPHSABS 3.5 09/14/2021 1802   LYMPHSABS 3.6 (H) 08/28/2018 1507   MONOABS 1.0 09/14/2021 1802   EOSABS 0.2 09/14/2021 1802   EOSABS 0.2 08/28/2018 1507   BASOSABS 0.1 09/14/2021 1802   BASOSABS 0.0 08/28/2018 1507    Lab Results  Component Value Date   HGBA1C 8.2 (A) 03/04/2023    Lab Results  Component Value Date   TSH 11.400 (H) 11/27/2022    Assessment & Plan:      Hypertension Elevated blood pressure reading today, but patient reported taking medication shortly before the visit. Previous blood pressure readings were within normal range. -Recheck blood pressure in one month. Continue current antihypertensive regimen.  Type 2 Diabetes Mellitus A1c increased to 8.2 from 8.0 despite current regimen of Metformin , Lantus  (48 units), Trulicity  (3mg ), and Jardiance . Patient reported inconsistent diet and occasional hypoglycemic episodes. -Increase Lantus  to 50 units daily. If hypoglycemia occurs, decrease to 48 units. -Increase Trulicity  to 4.5mg  weekly. -Encourage consistent diet and regular meal times to prevent hypoglycemia. -Order labs for kidney and liver function. -Counseled on Diabetic diet, my plate method, 849 minutes of moderate intensity exercise/week Blood sugar logs with  fasting goals of 80-120 mg/dl, random of less than 819 and in the event of sugars less than 60 mg/dl or greater than 599 mg/dl encouraged to notify the clinic. Advised on the need for annual eye exams, annual foot exams, Pneumonia vaccine.   Hypothyroidism Last thyroid  level was abnormal. -Check thyroid  level. Continue current thyroid  medication regimen.  Erectile Dysfunction Patient reported lack of efficacy with Viagra  and Cialis. -Increase Viagra  to 100mg  as needed. If ineffective, consider referral to Urology.  Gout -No acute flares -Continue Allopurinol   Foot Exam No abnormalities detected. -Continue regular foot care and exams.  Eye Exam Last exam several months ago. -Continue regular eye exams.  General Health Maintenance -Order cholesterol panel for next fasting lab visit.          Meds ordered this encounter  Medications   Dulaglutide  (TRULICITY ) 4.5 MG/0.5ML SOAJ    Sig: Inject 4.5 mg as directed once a week.    Dispense:  2 mL    Refill:  3    Dose increase   sildenafil  (VIAGRA ) 100 MG tablet    Sig: Take 1 tablet (100 mg total) by mouth daily as needed for erectile dysfunction.    Dispense:  15 tablet    Refill:  2    Follow-up: Return in about 1 month (around 04/04/2023) for Blood Pressure follow-up with PCP.       Corrina Sabin, MD, FAAFP. [facility] [place], [place] [phone]   03/04/2023, 5:18 PM

## 2023-03-05 ENCOUNTER — Other Ambulatory Visit: Payer: Self-pay | Admitting: Family Medicine

## 2023-03-05 ENCOUNTER — Other Ambulatory Visit: Payer: Self-pay

## 2023-03-05 DIAGNOSIS — E039 Hypothyroidism, unspecified: Secondary | ICD-10-CM

## 2023-03-05 DIAGNOSIS — E1165 Type 2 diabetes mellitus with hyperglycemia: Secondary | ICD-10-CM

## 2023-03-05 LAB — T3: T3, Total: 63 ng/dL — ABNORMAL LOW (ref 71–180)

## 2023-03-05 LAB — CMP14+EGFR
ALT: 13 [IU]/L (ref 0–44)
AST: 20 [IU]/L (ref 0–40)
Albumin: 4.5 g/dL (ref 3.9–4.9)
Alkaline Phosphatase: 84 [IU]/L (ref 44–121)
BUN/Creatinine Ratio: 11 (ref 10–24)
BUN: 12 mg/dL (ref 8–27)
Bilirubin Total: <0.2 mg/dL (ref 0.0–1.2)
CO2: 26 mmol/L (ref 20–29)
Calcium: 9.5 mg/dL (ref 8.6–10.2)
Chloride: 101 mmol/L (ref 96–106)
Creatinine, Ser: 1.06 mg/dL (ref 0.76–1.27)
Globulin, Total: 3.1 g/dL (ref 1.5–4.5)
Glucose: 123 mg/dL — ABNORMAL HIGH (ref 70–99)
Potassium: 4.5 mmol/L (ref 3.5–5.2)
Sodium: 139 mmol/L (ref 134–144)
Total Protein: 7.6 g/dL (ref 6.0–8.5)
eGFR: 77 mL/min/{1.73_m2} (ref >59)

## 2023-03-05 LAB — T4, FREE: Free T4: 0.93 ng/dL (ref 0.82–1.77)

## 2023-03-05 LAB — TSH: TSH: 12.8 u[IU]/mL — ABNORMAL HIGH (ref 0.450–4.500)

## 2023-03-05 MED ORDER — LEVOTHYROXINE SODIUM 125 MCG PO TABS
250.0000 ug | ORAL_TABLET | Freq: Every day | ORAL | 0 refills | Status: DC
  Filled 2023-03-05: qty 180, 90d supply, fill #0

## 2023-03-05 MED ORDER — TRUEPLUS PEN NEEDLES 31G X 6 MM MISC
5 refills | Status: AC
  Filled 2023-03-05: qty 100, 100d supply, fill #0

## 2023-03-06 ENCOUNTER — Other Ambulatory Visit: Payer: Self-pay

## 2023-03-14 ENCOUNTER — Other Ambulatory Visit: Payer: Self-pay

## 2023-03-16 DIAGNOSIS — Z8249 Family history of ischemic heart disease and other diseases of the circulatory system: Secondary | ICD-10-CM | POA: Diagnosis not present

## 2023-03-16 DIAGNOSIS — E785 Hyperlipidemia, unspecified: Secondary | ICD-10-CM | POA: Diagnosis not present

## 2023-03-16 DIAGNOSIS — Z794 Long term (current) use of insulin: Secondary | ICD-10-CM | POA: Diagnosis not present

## 2023-03-16 DIAGNOSIS — M1990 Unspecified osteoarthritis, unspecified site: Secondary | ICD-10-CM | POA: Diagnosis not present

## 2023-03-16 DIAGNOSIS — E039 Hypothyroidism, unspecified: Secondary | ICD-10-CM | POA: Diagnosis not present

## 2023-03-16 DIAGNOSIS — I10 Essential (primary) hypertension: Secondary | ICD-10-CM | POA: Diagnosis not present

## 2023-03-16 DIAGNOSIS — E1143 Type 2 diabetes mellitus with diabetic autonomic (poly)neuropathy: Secondary | ICD-10-CM | POA: Diagnosis not present

## 2023-03-16 DIAGNOSIS — Z7982 Long term (current) use of aspirin: Secondary | ICD-10-CM | POA: Diagnosis not present

## 2023-04-08 ENCOUNTER — Ambulatory Visit: Payer: Medicare PPO | Admitting: Family Medicine

## 2023-04-29 ENCOUNTER — Other Ambulatory Visit: Payer: Self-pay | Admitting: Family Medicine

## 2023-04-29 ENCOUNTER — Other Ambulatory Visit: Payer: Self-pay

## 2023-04-29 DIAGNOSIS — K3184 Gastroparesis: Secondary | ICD-10-CM

## 2023-04-29 DIAGNOSIS — M1A9XX0 Chronic gout, unspecified, without tophus (tophi): Secondary | ICD-10-CM

## 2023-04-29 MED ORDER — METOCLOPRAMIDE HCL 5 MG PO TABS
5.0000 mg | ORAL_TABLET | Freq: Three times a day (TID) | ORAL | 0 refills | Status: DC
  Filled 2023-04-29: qty 90, 30d supply, fill #0

## 2023-04-29 MED ORDER — ALLOPURINOL 300 MG PO TABS
300.0000 mg | ORAL_TABLET | Freq: Every day | ORAL | 0 refills | Status: DC
  Filled 2023-04-29: qty 30, 30d supply, fill #0

## 2023-04-30 ENCOUNTER — Other Ambulatory Visit: Payer: Self-pay

## 2023-04-30 ENCOUNTER — Telehealth: Payer: Self-pay

## 2023-04-30 NOTE — Telephone Encounter (Signed)
 Would you by any chance what the alternatives are?  He does see me on 05/08/2023 and I can make the substitution then.  Thank you.

## 2023-04-30 NOTE — Telephone Encounter (Signed)
 Copied from CRM [id number]. Topic: Clinical - Prescription Issue >> Apr 29, 2023  2:04 PM Everette C wrote: Reason for CRM: The patient has been directed by their pharmacy to contact their PCP and further discuss their prescriptions for Rx #: [id number]  allopurinol (ZYLOPRIM) 300 MG tablet [[id number]]   Rx #: [id number]  empagliflozin (JARDIANCE) 25 MG TABS tablet [[id number]]     and Rx #: [id number]  Dulaglutide (TRULICITY) 4.5 MG/0.5ML [place] [[id number]]   The patient has been told that they will need to be prescribed alternative forms of this medication

## 2023-05-01 ENCOUNTER — Other Ambulatory Visit: Payer: Self-pay | Admitting: Pharmacist

## 2023-05-01 ENCOUNTER — Other Ambulatory Visit: Payer: Self-pay

## 2023-05-01 DIAGNOSIS — E1165 Type 2 diabetes mellitus with hyperglycemia: Secondary | ICD-10-CM

## 2023-05-01 MED ORDER — EMPAGLIFLOZIN 25 MG PO TABS
25.0000 mg | ORAL_TABLET | Freq: Every day | ORAL | 1 refills | Status: DC
  Filled 2023-05-01 – 2023-08-08 (×2): qty 90, 90d supply, fill #0

## 2023-05-06 ENCOUNTER — Other Ambulatory Visit: Payer: Self-pay

## 2023-05-08 ENCOUNTER — Encounter: Payer: Self-pay | Admitting: Family Medicine

## 2023-05-08 ENCOUNTER — Ambulatory Visit: Payer: Medicare PPO | Attending: Family Medicine | Admitting: Family Medicine

## 2023-05-08 VITALS — BP 138/88 | HR 90 | Ht 72.0 in | Wt 268.8 lb

## 2023-05-08 DIAGNOSIS — G5602 Carpal tunnel syndrome, left upper limb: Secondary | ICD-10-CM

## 2023-05-08 DIAGNOSIS — J069 Acute upper respiratory infection, unspecified: Secondary | ICD-10-CM | POA: Diagnosis not present

## 2023-05-08 DIAGNOSIS — M5431 Sciatica, right side: Secondary | ICD-10-CM

## 2023-05-08 DIAGNOSIS — E1159 Type 2 diabetes mellitus with other circulatory complications: Secondary | ICD-10-CM

## 2023-05-08 DIAGNOSIS — I10 Essential (primary) hypertension: Secondary | ICD-10-CM

## 2023-05-08 NOTE — Patient Instructions (Signed)
 VISIT SUMMARY:  During today's visit, we reviewed your blood pressure, diabetes management, and addressed your recent head cold, hand numbness, and sciatica. Your blood pressure is currently well-controlled, and you have made significant progress in managing your diabetes through lifestyle changes. We also discussed your symptoms of a head cold, possible carpal tunnel syndrome, and ongoing sciatica.  YOUR PLAN:  -UPPER REPSIRATORY INFECTION : A head cold is a common viral infection that affects your nose and throat, causing symptoms like a runny nose, sneezing, and coughing. We recommend treating your symptoms as needed with over-the-counter medications.  -TYPE 2 DIABETES MELLITUS: Type 2 diabetes is a condition where your body does not use insulin properly, leading to high blood sugar levels. You have successfully lost nine pounds, which is expected to help control your blood sugar. Continue with your current diabetes management plan, and we will re-evaluate your A1c levels in April. Please schedule a follow-up appointment in two months.  -HYPERTENSION: Hypertension, or high blood pressure, is a condition where the force of the blood against your artery walls is too high. Your blood pressure is currently well-controlled at 138/88 mmHg. We encourage you to monitor your blood pressure at home with a new device.  -CARPAL TUNNEL SYNDROME: Carpal tunnel syndrome is a condition that causes numbness, tingling, or weakness in your hand due to pressure on the median nerve. We recommend using a wrist brace, especially during sleep, to help manage your symptoms.  -SCIATICA: Sciatica is pain that radiates along the path of the sciatic nerve, which runs from your lower back through your hips and down each leg. Your symptoms are currently manageable, but if the pain becomes unmanageable, we may consider referring you to a back specialist for further treatment, such as injections.  INSTRUCTIONS:  Please schedule  a follow-up appointment in two months to re-evaluate your A1c levels. Additionally, consider purchasing a new home blood pressure monitor to keep track of your blood pressure regularly.

## 2023-05-08 NOTE — Progress Notes (Signed)
 Subjective:  Patient ID: Ray Garner, male    DOB: 12-03-1955  Age: 68 y.o. MRN: [id number]  CC: Hypertension     Discussed the use of AI scribe software for clinical note transcription with the patient, who gave verbal consent to proceed.  History of Present Illness The patient, with a history of Type 2 diabetes mellitus (A1c 8.2), hypertension, hypothyroidism, Gout  was asked to return for a follow-up visit due to previously elevated blood pressure. Today, his blood pressure is normal at 138/88. He reports taking his antihypertensive medication as prescribed. He does not have a home blood pressure monitor and his current one is not functioning properly.  The patient has also made significant lifestyle changes to manage his diabetes. He has lost nine pounds since his last visit through increased physical activity and dietary modifications. He reports eating smaller, more frequent meals throughout the day, including a lot of fruits.  In addition to his chronic conditions, the patient reports a recent head cold with symptoms of a runny nose, sneezing, and coughing. He also reports numbness in his left hand when holding his phone for extended periods.  He is right-hand dominant.  He also continues to experience sciatica, which he attributes to a compressed disc in his lower back.    Past Medical History:  Diagnosis Date   Anemia    Arthritis    Back pain, lumbosacral 06/30/2013   Blood transfusion without reported diagnosis    Cataract    BEGINNING   DM type 2 (diabetes mellitus, type 2) (HCC) 06/30/2013   Gout    HTN (hypertension) 06/30/2013   Hypertension    Hypothyroid 06/30/2013    Past Surgical History:  Procedure Laterality Date   CYST EXCISION     FRACTURE SURGERY      Family History  Problem Relation Age of Onset   Breast cancer Niece    Colon cancer Neg Hx    Colon polyps Neg Hx    Esophageal cancer Neg Hx    Rectal cancer Neg Hx    Stomach cancer Neg Hx     Social  History   Socioeconomic History   Marital status: Divorced    Spouse name: Not on file   Number of children: Not on file   Years of education: Not on file   Highest education level: Not on file  Occupational History   Not on file  Tobacco Use   Smoking status: Former    Current packs/day: 0.00    Types: Cigarettes    Quit date: 05/14/1983    Years since quitting: 40.0   Smokeless tobacco: Former  Building services engineer status: Former  Substance and Sexual Activity   Alcohol use: Yes    Comment: OCC.   Drug use: No   Sexual activity: Not on file  Other Topics Concern   Not on file  Social History Narrative   Not on file   Social Drivers of Health   Financial Resource Strain: Low Risk  (03/04/2023)   Overall Financial Resource Strain (CARDIA)    Difficulty of Paying Living Expenses: Not hard at all  Food Insecurity: No Food Insecurity (03/04/2023)   Hunger Vital Sign    Worried About Running Out of Food in the Last Year: Never true    Ran Out of Food in the Last Year: Never true  Transportation Needs: No Transportation Needs (03/04/2023)   PRAPARE - Administrator, Civil Service (Medical): No  Lack of Transportation (Non-Medical): No  Physical Activity: Insufficiently Active (10/03/2022)   Exercise Vital Sign    Days of Exercise per Week: 3 days    Minutes of Exercise per Session: 30 min  Stress: No Stress Concern Present (03/04/2023)   [employer] of Occupational Health - Occupational Stress Questionnaire    Feeling of Stress : Not at all  Social Connections: Moderately Integrated (03/04/2023)   Social Connection and Isolation Panel [NHANES]    Frequency of Communication with Friends and Family: More than three times a week    Frequency of Social Gatherings with Friends and Family: More than three times a week    Attends Religious Services: More than 4 times per year    Active Member of [employer] or Organizations: No    Attends Engineer, structural: 1 to  4 times per year    Marital Status: Divorced    Allergies  Allergen Reactions   Vicodin [Hydrocodone-Acetaminophen] Hives and Rash    Outpatient Medications Prior to Visit  Medication Sig Dispense Refill   allopurinol (ZYLOPRIM) 300 MG tablet Take 1 tablet (300 mg total) by mouth daily. 30 tablet 0   amLODipine (NORVASC) 10 MG tablet Take 1 tablet (10 mg total) by mouth daily. 90 tablet 1   atorvastatin (LIPITOR) 40 MG tablet Take 1 tablet (40 mg total) by mouth daily. 90 tablet 1   Continuous Blood Gluc Receiver (FREESTYLE LIBRE 14 DAY READER) DEVI 1 each by Does not apply route 3 (three) times daily with meals. 1 Device 0   Continuous Blood Gluc Sensor (FREESTYLE LIBRE 14 DAY SENSOR) MISC 1 each by Does not apply route 3 (three) times daily with meals. 1 each 11   cyclobenzaprine (FLEXERIL) 10 MG tablet Take 1 tablet (10 mg total) by mouth 2 (two) times daily as needed for muscle spasms. 20 tablet 0   Dulaglutide (TRULICITY) 4.5 MG/0.5ML SOAJ Inject 4.5 mg as directed once a week. 2 mL 3   empagliflozin (JARDIANCE) 25 MG TABS tablet Take 1 tablet (25 mg total) by mouth daily before breakfast. 90 tablet 1   ergocalciferol (DRISDOL) 1.25 MG (50000 UT) capsule Take 1 capsule (50,000 Units total) by mouth once a week. 12 capsule 0   fluticasone (FLONASE) 50 MCG/ACT nasal spray Place 2 sprays into both nostrils daily. 16 g 1   gabapentin (NEURONTIN) 300 MG capsule Take 1 capsule (300 mg total) by mouth at bedtime. 30 capsule 6   glucose blood (BAYER CONTOUR TEST) test strip Use as instructed 100 each 12   hydrocortisone 1 % ointment Apply 1 application topically 2 (two) times daily. 30 g 1   ibuprofen (ADVIL) 800 MG tablet Take 1 tablet (800 mg total) by mouth 3 (three) times daily. 21 tablet 0   insulin glargine (LANTUS SOLOSTAR) 100 UNIT/ML Solostar Pen Inject 50 Units into the skin daily. 30 mL 3   Insulin Pen Needle (TRUEPLUS PEN NEEDLES) 31G X 6 MM MISC USE AS DIRECTED. 100 each 5    levothyroxine (SYNTHROID) 125 MCG tablet Take 2 tablets (250 mcg total) by mouth daily before breakfast. 180 tablet 0   meclizine (ANTIVERT) 25 MG tablet Take 1 tablet (25 mg total) by mouth 2 (two) times daily as needed for dizziness. 30 tablet 0   meloxicam (MOBIC) 7.5 MG tablet Take 1 tablet (7.5 mg total) by mouth 2 (two) times daily. 28 tablet 0   metFORMIN (GLUCOPHAGE) 500 MG tablet Take 2 tablets (1,000 mg total) by mouth  2 (two) times daily with a meal. 360 tablet 1   metoCLOPramide (REGLAN) 5 MG tablet Take 1 tablet (5 mg total) by mouth 3 (three) times daily before meals. 90 tablet 0   omeprazole (PRILOSEC) 20 MG capsule Take 1 capsule (20 mg total) by mouth daily. 90 capsule 1   polyethylene glycol (MIRALAX / GLYCOLAX) 17 g packet Take 17 g by mouth daily. 14 each 0   sildenafil (VIAGRA) 100 MG tablet Take 1 tablet (100 mg total) by mouth daily as needed for erectile dysfunction. 15 tablet 2   Spacer/Aero-Holding Chambers (AEROCHAMBER PLUS) inhaler Use as instructed 1 each 2   tiZANidine (ZANAFLEX) 4 MG tablet Take 1 tablet (4 mg total) by mouth every 6 (six) hours as needed for muscle spasms for up to 5 days. 20 tablet 0   traMADol (ULTRAM) 50 MG tablet Take 1 tablet (50 mg total) by mouth every 6 (six) hours as needed. 15 tablet 0   vitamin B-12 (CYANOCOBALAMIN) 1000 MCG tablet Take 1,000 mcg by mouth daily.     carvedilol (COREG) 6.25 MG tablet TAKE 1 TABLET (6.25 MG TOTAL) BY MOUTH 2 (TWO) TIMES DAILY WITH A MEAL. 60 tablet 6   losartan (COZAAR) 100 MG tablet Take 1 tablet (100 mg total) by mouth daily. 30 tablet 6   No facility-administered medications prior to visit.     ROS Review of Systems  Constitutional:  Negative for activity change and appetite change.  HENT:  Positive for rhinorrhea and sneezing. Negative for sinus pressure and sore throat.   Respiratory:  Positive for cough. Negative for chest tightness, shortness of breath and wheezing.   Cardiovascular:  Negative  for chest pain and palpitations.  Gastrointestinal:  Negative for abdominal distention, abdominal pain and constipation.  Genitourinary: Negative.   Musculoskeletal:        See HPI  Psychiatric/Behavioral:  Negative for behavioral problems and dysphoric mood.     Objective:  BP 138/88   Pulse 90   Ht 6' (1.829 m)   Wt 268 lb 12.8 oz (121.9 kg)   SpO2 97%   BMI 36.46 kg/m      05/08/2023    2:18 PM 03/04/2023    4:32 PM 03/04/2023    3:54 PM  BP/Weight  Systolic BP 138 167 158  Diastolic BP 88 83 87  Wt. (Lbs) 268.8  277  BMI 36.46 kg/m2  37.57 kg/m2    Wt Readings from Last 3 Encounters:  05/08/23 268 lb 12.8 oz (121.9 kg)  03/04/23 277 lb (125.6 kg)  12/10/22 268 lb (121.6 kg)      Physical Exam Constitutional:      Appearance: He is well-developed.  Cardiovascular:     Rate and Rhythm: Normal rate.     Heart sounds: Normal heart sounds. No murmur heard. Pulmonary:     Effort: Pulmonary effort is normal.     Breath sounds: Normal breath sounds. No wheezing or rales.  Chest:     Chest wall: No tenderness.  Abdominal:     General: Bowel sounds are normal. There is no distension.     Palpations: Abdomen is soft. There is no mass.     Tenderness: There is no abdominal tenderness.  Musculoskeletal:        General: Normal range of motion.     Right lower leg: No edema.     Left lower leg: No edema.     Comments: Negative Tinel and Phalen signs No tenderness on palpation  of the back; negative straight leg raise bilaterally  Neurological:     Mental Status: He is alert and oriented to person, place, and time.  Psychiatric:        Mood and Affect: Mood normal.        Latest Ref Rng & Units 03/04/2023    4:48 PM 03/16/2022    8:36 AM 09/14/2021    6:02 PM  CMP  Glucose 70 - 99 mg/dL 621  308  657   BUN 8 - 27 mg/dL 12  11  34   Creatinine 0.76 - 1.27 mg/dL 8.46  9.62  9.52   Sodium 134 - 144 mmol/L 139  142  136   Potassium 3.5 - 5.2 mmol/L 4.5  4.6  4.1    Chloride 96 - 106 mmol/L 101  106  103   CO2 20 - 29 mmol/L 26  23  24    Calcium 8.6 - 10.2 mg/dL 9.5  9.2  9.4   Total Protein 6.0 - 8.5 g/dL 7.6  7.0    Total Bilirubin 0.0 - 1.2 mg/dL <8.4  0.2    Alkaline Phos 44 - 121 IU/L 84  109    AST 0 - 40 IU/L 20  16    ALT 0 - 44 IU/L 13  19      Lipid Panel     Component Value Date/Time   CHOL 102 03/16/2022 0836   TRIG 145 03/16/2022 0836   HDL 31 (L) 03/16/2022 0836   CHOLHDL 3.4 12/16/2019 1140   CHOLHDL 3.6 08/08/2015 0845   VLDL 34 (H) 08/08/2015 0845   LDLCALC 46 03/16/2022 0836    CBC    Component Value Date/Time   WBC 14.1 (H) 09/14/2021 1802   RBC 5.21 09/14/2021 1802   HGB 14.3 09/14/2021 1802   HGB 13.2 08/28/2018 1507   HCT 43.9 09/14/2021 1802   HCT 39.0 08/28/2018 1507   PLT 351 09/14/2021 1802   PLT 274 08/28/2018 1507   MCV 84.3 09/14/2021 1802   MCV 84 08/28/2018 1507   MCH 27.4 09/14/2021 1802   MCHC 32.6 09/14/2021 1802   RDW 13.6 09/14/2021 1802   RDW 13.4 08/28/2018 1507   LYMPHSABS 3.5 09/14/2021 1802   LYMPHSABS 3.6 (H) 08/28/2018 1507   MONOABS 1.0 09/14/2021 1802   EOSABS 0.2 09/14/2021 1802   EOSABS 0.2 08/28/2018 1507   BASOSABS 0.1 09/14/2021 1802   BASOSABS 0.0 08/28/2018 1507    Lab Results  Component Value Date   HGBA1C 8.2 (A) 03/04/2023       Assessment & Plan Upper respiratory infection Acute rhinorrhea, sneezing, and coughing likely due to a head cold. - Symptomatic treatment as needed.   Hypertension Blood pressure well-controlled at 138/88 mmHg. - Encourage home blood pressure monitoring with a new device.  Carpal Tunnel Syndrome Intermittent left hand numbness likely due to carpal tunnel syndrome. Prefers conservative management. - Recommend using a wrist brace, especially during sleep.  Sciatica Intermittent right leg pain. -He has had PT in the past with no relief. -Symptoms manageable without intervention. - Consider referral to a back specialist for  injections if pain becomes unmanageable.      No orders of the defined types were placed in this encounter.   Follow-up: Return in about 3 months (around 08/08/2023) for Chronic medical conditions.       Hoy Register, MD, FAAFP. [name] [place], [place] [phone]   05/08/2023, 3:06 PM

## 2023-05-24 ENCOUNTER — Telehealth: Payer: Self-pay

## 2023-05-24 ENCOUNTER — Other Ambulatory Visit: Payer: Self-pay

## 2023-05-24 NOTE — Telephone Encounter (Signed)
 Error

## 2023-05-24 NOTE — Telephone Encounter (Signed)
 Patient was identified as falling into the [place] Measure - Diabetes.   Patient was: Appointment already scheduled for:  June 16 ,2025 with PCP.

## 2023-05-27 ENCOUNTER — Other Ambulatory Visit: Payer: Self-pay

## 2023-06-06 ENCOUNTER — Other Ambulatory Visit: Payer: Self-pay

## 2023-07-08 ENCOUNTER — Other Ambulatory Visit: Payer: Self-pay

## 2023-08-08 ENCOUNTER — Encounter: Payer: Self-pay | Admitting: Pharmacist

## 2023-08-08 ENCOUNTER — Other Ambulatory Visit: Payer: Self-pay

## 2023-08-08 ENCOUNTER — Other Ambulatory Visit (HOSPITAL_BASED_OUTPATIENT_CLINIC_OR_DEPARTMENT_OTHER): Admitting: Pharmacist

## 2023-08-08 DIAGNOSIS — Z794 Long term (current) use of insulin: Secondary | ICD-10-CM

## 2023-08-08 DIAGNOSIS — Z7984 Long term (current) use of oral hypoglycemic drugs: Secondary | ICD-10-CM

## 2023-08-08 DIAGNOSIS — E1165 Type 2 diabetes mellitus with hyperglycemia: Secondary | ICD-10-CM

## 2023-08-08 DIAGNOSIS — Z7985 Long-term (current) use of injectable non-insulin antidiabetic drugs: Secondary | ICD-10-CM

## 2023-08-08 MED ORDER — TRULICITY 4.5 MG/0.5ML ~~LOC~~ SOAJ
4.5000 mg | SUBCUTANEOUS | 3 refills | Status: DC
  Filled 2023-08-08: qty 2, 28d supply, fill #0

## 2023-08-08 NOTE — Progress Notes (Signed)
 Pharmacy TNM Diabetes Measure Review  S:  Patient was identified in a report as being at risk for failing the True [place] Metric of A1c control (<8%). Last A1c was 8.2. Last PCP visit was 05/08/2023.  Call placed to patient to discuss diabetes control and medication management. Unfortunately, Ray Garner answered but the call was disconnected once I introduced myself. I was unable to reconnect with him. His medication adherence looks to be okay, however, he is behind on Jardiance , Lantus , and Trulicity  refills.   Current diabetes medications include: Trulicity , Lantus , Jardiance , and metfromin  Dispense dates:  -Lantus : 04/30/2023 for a 60-day supply  -Trulicity : 07/08/2023 for a 28-day supply -Jardiance : 04/30/2023 for a 90-day supply -Metformin : 05/27/2023 for a 90-day supply  Insurance coverage: [employer]   O:  Lab Results  Component Value Date   HGBA1C 8.2 (A) 03/04/2023   There were no vitals filed for this visit.  Lipid Panel     Component Value Date/Time   CHOL 102 03/16/2022 0836   TRIG 145 03/16/2022 0836   HDL 31 (L) 03/16/2022 0836   CHOLHDL 3.4 12/16/2019 1140   CHOLHDL 3.6 08/08/2015 0845   VLDL 34 (H) 08/08/2015 0845   LDLCALC 46 03/16/2022 0836    Clinical Atherosclerotic Cardiovascular Disease (ASCVD): No  The ASCVD Risk score (Arnett DK, et al., 2019) failed to calculate for the following reasons:   The valid total cholesterol range is 130 to 320 mg/dL   Patient is participating in a Managed Medicaid Plan: No   A/P: Diabetes longstanding currently above goal, and it looks like medication non-adherence is a factor. I collaborated with pharmacy to provide refills. -Continued current regimen. -Approved refills for Jardiance , Lantus , and Trulicity . -Next A1c anticipated 08/12/2023.   Follow-up:  PCP clinic visit in 08/12/2023  Marene Shape, PharmD, BCACP, CPP Clinical Pharmacist [facility] [phone]

## 2023-08-12 ENCOUNTER — Other Ambulatory Visit (HOSPITAL_COMMUNITY): Payer: Self-pay

## 2023-08-12 ENCOUNTER — Encounter: Payer: Self-pay | Admitting: Family Medicine

## 2023-08-12 ENCOUNTER — Ambulatory Visit: Attending: Family Medicine | Admitting: Family Medicine

## 2023-08-12 ENCOUNTER — Other Ambulatory Visit: Payer: Self-pay | Admitting: Family Medicine

## 2023-08-12 ENCOUNTER — Other Ambulatory Visit: Payer: Self-pay

## 2023-08-12 VITALS — BP 138/84 | HR 85 | Ht 72.0 in | Wt 281.0 lb

## 2023-08-12 DIAGNOSIS — K3184 Gastroparesis: Secondary | ICD-10-CM

## 2023-08-12 DIAGNOSIS — I152 Hypertension secondary to endocrine disorders: Secondary | ICD-10-CM

## 2023-08-12 DIAGNOSIS — E1159 Type 2 diabetes mellitus with other circulatory complications: Secondary | ICD-10-CM

## 2023-08-12 DIAGNOSIS — E039 Hypothyroidism, unspecified: Secondary | ICD-10-CM

## 2023-08-12 DIAGNOSIS — Z7985 Long-term (current) use of injectable non-insulin antidiabetic drugs: Secondary | ICD-10-CM | POA: Diagnosis not present

## 2023-08-12 DIAGNOSIS — Z794 Long term (current) use of insulin: Secondary | ICD-10-CM | POA: Diagnosis not present

## 2023-08-12 DIAGNOSIS — Z125 Encounter for screening for malignant neoplasm of prostate: Secondary | ICD-10-CM

## 2023-08-12 DIAGNOSIS — E1165 Type 2 diabetes mellitus with hyperglycemia: Secondary | ICD-10-CM | POA: Diagnosis not present

## 2023-08-12 DIAGNOSIS — K219 Gastro-esophageal reflux disease without esophagitis: Secondary | ICD-10-CM | POA: Diagnosis not present

## 2023-08-12 DIAGNOSIS — M1A9XX0 Chronic gout, unspecified, without tophus (tophi): Secondary | ICD-10-CM

## 2023-08-12 LAB — POCT GLYCOSYLATED HEMOGLOBIN (HGB A1C): HbA1c, POC (controlled diabetic range): 8.7 % — AB (ref 0.0–7.0)

## 2023-08-12 MED ORDER — AMLODIPINE BESYLATE 10 MG PO TABS
10.0000 mg | ORAL_TABLET | Freq: Every day | ORAL | 1 refills | Status: AC
  Filled 2023-11-06: qty 90, 90d supply, fill #0
  Filled 2024-02-12: qty 90, 90d supply, fill #1

## 2023-08-12 MED ORDER — LOSARTAN POTASSIUM 100 MG PO TABS
100.0000 mg | ORAL_TABLET | Freq: Every day | ORAL | 1 refills | Status: DC
Start: 2023-08-12 — End: 2023-11-12
  Filled 2023-11-06: qty 90, 90d supply, fill #0

## 2023-08-12 MED ORDER — LANTUS SOLOSTAR 100 UNIT/ML ~~LOC~~ SOPN
50.0000 [IU] | PEN_INJECTOR | Freq: Every day | SUBCUTANEOUS | 3 refills | Status: DC
  Filled 2023-10-09: qty 30, 60d supply, fill #0

## 2023-08-12 MED ORDER — ALLOPURINOL 300 MG PO TABS
300.0000 mg | ORAL_TABLET | Freq: Every day | ORAL | 0 refills | Status: DC
  Filled 2023-08-12: qty 30, 30d supply, fill #0

## 2023-08-12 MED ORDER — ATORVASTATIN CALCIUM 40 MG PO TABS
40.0000 mg | ORAL_TABLET | Freq: Every day | ORAL | 1 refills | Status: AC
  Filled 2023-11-06 (×2): qty 90, 90d supply, fill #0
  Filled 2024-02-12: qty 90, 90d supply, fill #1

## 2023-08-12 MED ORDER — ATORVASTATIN CALCIUM 40 MG PO TABS
40.0000 mg | ORAL_TABLET | Freq: Every day | ORAL | 1 refills | Status: DC
Start: 2023-08-12 — End: 2023-08-12
  Filled 2023-08-12: qty 90, 90d supply, fill #0

## 2023-08-12 MED ORDER — AMLODIPINE BESYLATE 10 MG PO TABS
10.0000 mg | ORAL_TABLET | Freq: Every day | ORAL | 1 refills | Status: DC
  Filled 2023-08-12: qty 90, 90d supply, fill #0

## 2023-08-12 MED ORDER — TIRZEPATIDE 12.5 MG/0.5ML ~~LOC~~ SOAJ
12.5000 mg | SUBCUTANEOUS | 6 refills | Status: DC
  Filled 2023-08-12: qty 6, 84d supply, fill #0

## 2023-08-12 MED ORDER — METFORMIN HCL 500 MG PO TABS
1000.0000 mg | ORAL_TABLET | Freq: Two times a day (BID) | ORAL | 1 refills | Status: AC
  Filled 2023-11-13 (×2): qty 360, 90d supply, fill #0
  Filled 2024-02-12: qty 360, 90d supply, fill #1

## 2023-08-12 MED ORDER — OMEPRAZOLE 20 MG PO CPDR
20.0000 mg | DELAYED_RELEASE_CAPSULE | Freq: Every day | ORAL | 1 refills | Status: DC
  Filled 2023-08-12: qty 90, 90d supply, fill #0
  Filled 2023-11-06 (×2): qty 90, 90d supply, fill #1

## 2023-08-12 MED ORDER — LEVOTHYROXINE SODIUM 125 MCG PO TABS
250.0000 ug | ORAL_TABLET | Freq: Every day | ORAL | 0 refills | Status: DC
Start: 2023-08-12 — End: 2023-11-06
  Filled 2023-08-12: qty 180, 90d supply, fill #0

## 2023-08-12 MED ORDER — METOCLOPRAMIDE HCL 5 MG PO TABS
5.0000 mg | ORAL_TABLET | Freq: Three times a day (TID) | ORAL | 0 refills | Status: AC
  Filled 2023-08-12: qty 90, 30d supply, fill #0

## 2023-08-12 MED ORDER — EMPAGLIFLOZIN 25 MG PO TABS
25.0000 mg | ORAL_TABLET | Freq: Every day | ORAL | 1 refills | Status: AC
  Filled 2023-11-06 (×2): qty 90, 90d supply, fill #0
  Filled 2024-02-12: qty 90, 90d supply, fill #1

## 2023-08-12 NOTE — Patient Instructions (Signed)
 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 We discussed your recent increase in A1c levels, weight gain, and your current medications. We also reviewed your thyroid  function, cholesterol levels, and prostate health.  YOUR PLAN:  -TYPE 2 DIABETES MELLITUS: Your A1c level has increased to 8.7%, indicating that your blood sugar control needs improvement. We will switch your medication from Trulicity  to Mounjaro to help with weight loss and better blood sugar control. Please discontinue Trulicity  and start taking Mounjaro 12.5 mg. Follow up with the pharmacist in one month to see how the new medication is working and to adjust the dosage if needed. Continue to monitor your blood sugar three times daily and bring your meter to your next appointment. If your blood sugar levels remain low, reduce your insulin  by 2 units.  -HYPOTHYROIDISM: Hypothyroidism means your thyroid  is not producing enough hormones. We will check your thyroid  function with a blood test and adjust your levothyroxine  dosage based on the results.  -HYPERLIPIDEMIA: Hyperlipidemia means you have high cholesterol levels. We will reassess your cholesterol with a fasting blood test.  -PROSTATE HEALTH: Your nocturia, or frequent nighttime urination, may be related to an enlarged prostate. We will order a prostate cancer screening test to ensure there are no issues.  -GENERAL HEALTH MAINTENANCE: We discussed the importance of managing your weight due to recent gain. Continue with physical activity and healthy eating habits.  INSTRUCTIONS:  Please follow up with the pharmacist in one month to assess the efficacy of Mounjaro and adjust the dosage if needed. Continue to monitor your blood sugar three times daily and bring your meter to your next appointment. If your blood sugar levels remain low, reduce your insulin  by 2 units. Additionally, get your thyroid  function and fasting cholesterol  blood tests done as ordered. We will also conduct a prostate cancer screening test.

## 2023-08-12 NOTE — Progress Notes (Signed)
 Subjective:  Patient ID: Ray Garner, male    DOB: 1955-08-06  Age: 68 y.o. MRN: [id number]  CC: Medical Management of Chronic Issues     Discussed the use of AI scribe software for clinical note transcription with the patient, who gave verbal consent to proceed.  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  His A1c has increased from 8.2% to 8.7%, and he has regained 12 pounds in the last 3 months. He is on insulin , Trulicity , Jardiance , and Metformin . Morning blood sugar levels are low at 58, while daytime levels are around 180. He checks his blood sugar three times daily and has no severe hypoglycemic episodes, though he sweats in direct sunlight.  His diet includes fruits, cabbage, chicken, and dumplings, with small meals like sandwiches or pears during part-time work. He questions if he should eat more, feeling he may not be eating enough.  His past medical history includes an abnormal thyroid  test, managed with levothyroxine , and a normal cholesterol test from January of the previous year. He has no cardiac issues, numbness, or foot ulcers. His last eye exam eight weeks ago was normal.  He experiences nocturia, waking two to three times a night to urinate, but states symptoms are not bothersome.  He has screened negative for prostate cancer in the past.    Past Medical History:  Diagnosis Date   Anemia    Arthritis    Back pain, lumbosacral 06/30/2013   Blood transfusion without reported diagnosis    Cataract    BEGINNING   DM type 2 (diabetes mellitus, type 2) (HCC) 06/30/2013   Gout    HTN (hypertension) 06/30/2013   Hypertension    Hypothyroid 06/30/2013    Past Surgical History:  Procedure Laterality Date   CYST EXCISION     FRACTURE SURGERY      Family History  Problem Relation Age  of Onset   Breast cancer Niece    Colon cancer Neg Hx    Colon polyps Neg Hx    Esophageal cancer Neg Hx    Rectal cancer Neg Hx    Stomach cancer Neg Hx     Social History   Socioeconomic History   Marital status: Divorced    Spouse name: Not on file   Number of children: Not on file   Years of education: Not on file   Highest education level: Not on file  Occupational History   Not on file  Tobacco Use   Smoking status: Former    Current packs/day: 0.00    Types: Cigarettes    Quit date: 05/14/1983    Years since quitting: 40.2   Smokeless tobacco: Former  Building services engineer status: Former  Substance and Sexual Activity   Alcohol use: Yes    Comment: OCC.   Drug use: No   Sexual activity: Not on file  Other Topics Concern   Not on file  Social History Narrative   Not on file   Social Drivers of Health   Financial Resource Strain: Low Risk  (03/04/2023)   Overall Financial Resource Strain (CARDIA)    Difficulty of Paying Living Expenses: Not hard at all  Food Insecurity: No Food Insecurity (03/04/2023)   Hunger Vital Sign    Worried About Running Out of Food in the Last Year: Never  true    Ran Out of Food in the Last Year: Never true  Transportation Needs: No Transportation Needs (03/04/2023)   PRAPARE - Administrator, Civil Service (Medical): No    Lack of Transportation (Non-Medical): No  Physical Activity: Insufficiently Active (10/03/2022)   Exercise Vital Sign    Days of Exercise per Week: 3 days    Minutes of Exercise per Session: 30 min  Stress: No Stress Concern Present (03/04/2023)   [employer] of Occupational Health - Occupational Stress Questionnaire    Feeling of Stress : Not at all  Social Connections: Moderately Integrated (03/04/2023)   Social Connection and Isolation Panel    Frequency of Communication with Friends and Family: More than three times a week    Frequency of Social Gatherings with Friends and Family: More than three  times a week    Attends Religious Services: More than 4 times per year    Active Member of [employer] or Organizations: No    Attends Engineer, structural: 1 to 4 times per year    Marital Status: Divorced    Allergies  Allergen Reactions   Vicodin [Hydrocodone -Acetaminophen ] Hives and Rash    Outpatient Medications Prior to Visit  Medication Sig Dispense Refill   allopurinol  (ZYLOPRIM ) 300 MG tablet Take 1 tablet (300 mg total) by mouth daily. 30 tablet 0   carvedilol  (COREG ) 6.25 MG tablet TAKE 1 TABLET (6.25 MG TOTAL) BY MOUTH 2 (TWO) TIMES DAILY WITH A MEAL. 60 tablet 6   Continuous Blood Gluc Receiver (FREESTYLE LIBRE 14 DAY READER) DEVI 1 each by Does not apply route 3 (three) times daily with meals. 1 Device 0   Continuous Blood Gluc Sensor (FREESTYLE LIBRE 14 DAY SENSOR) MISC 1 each by Does not apply route 3 (three) times daily with meals. 1 each 11   cyclobenzaprine  (FLEXERIL ) 10 MG tablet Take 1 tablet (10 mg total) by mouth 2 (two) times daily as needed for muscle spasms. 20 tablet 0   ergocalciferol  (DRISDOL ) 1.25 MG (50000 UT) capsule Take 1 capsule (50,000 Units total) by mouth once a week. 12 capsule 0   fluticasone  (FLONASE ) 50 MCG/ACT nasal spray Place 2 sprays into both nostrils daily. 16 g 1   glucose blood (BAYER CONTOUR TEST) test strip Use as instructed 100 each 12   hydrocortisone  1 % ointment Apply 1 application topically 2 (two) times daily. 30 g 1   ibuprofen  (ADVIL ) 800 MG tablet Take 1 tablet (800 mg total) by mouth 3 (three) times daily. 21 tablet 0   Insulin  Pen Needle (TRUEPLUS PEN NEEDLES) 31G X 6 MM MISC USE AS DIRECTED. 100 each 5   levothyroxine  (SYNTHROID ) 125 MCG tablet Take 2 tablets (250 mcg total) by mouth daily before breakfast. 180 tablet 0   losartan  (COZAAR ) 100 MG tablet Take 1 tablet (100 mg total) by mouth daily. 30 tablet 6   meclizine  (ANTIVERT ) 25 MG tablet Take 1 tablet (25 mg total) by mouth 2 (two) times daily as needed for  dizziness. 30 tablet 0   meloxicam  (MOBIC ) 7.5 MG tablet Take 1 tablet (7.5 mg total) by mouth 2 (two) times daily. 28 tablet 0   metoCLOPramide  (REGLAN ) 5 MG tablet Take 1 tablet (5 mg total) by mouth 3 (three) times daily before meals. 90 tablet 0   polyethylene glycol (MIRALAX  / GLYCOLAX ) 17 g packet Take 17 g by mouth daily. 14 each 0   sildenafil  (VIAGRA ) 100 MG tablet Take 1 tablet (  100 mg total) by mouth daily as needed for erectile dysfunction. 15 tablet 2   Spacer/Aero-Holding Chambers (AEROCHAMBER PLUS) inhaler Use as instructed 1 each 2   tiZANidine  (ZANAFLEX ) 4 MG tablet Take 1 tablet (4 mg total) by mouth every 6 (six) hours as needed for muscle spasms for up to 5 days. 20 tablet 0   traMADol  (ULTRAM ) 50 MG tablet Take 1 tablet (50 mg total) by mouth every 6 (six) hours as needed. 15 tablet 0   vitamin B-12 (CYANOCOBALAMIN) 1000 MCG tablet Take 1,000 mcg by mouth daily.     amLODipine  (NORVASC ) 10 MG tablet Take 1 tablet (10 mg total) by mouth daily. 90 tablet 1   atorvastatin  (LIPITOR) 40 MG tablet Take 1 tablet (40 mg total) by mouth daily. 90 tablet 1   Dulaglutide  (TRULICITY ) 4.5 MG/0.5ML SOAJ Inject 4.5 mg as directed once a week. 2 mL 3   empagliflozin  (JARDIANCE ) 25 MG TABS tablet Take 1 tablet (25 mg total) by mouth daily before breakfast. 90 tablet 1   insulin  glargine (LANTUS  SOLOSTAR) 100 UNIT/ML Solostar Pen Inject 50 Units into the skin daily. 30 mL 3   metFORMIN  (GLUCOPHAGE ) 500 MG tablet Take 2 tablets (1,000 mg total) by mouth 2 (two) times daily with a meal. 360 tablet 1   omeprazole  (PRILOSEC) 20 MG capsule Take 1 capsule (20 mg total) by mouth daily. 90 capsule 1   gabapentin  (NEURONTIN ) 300 MG capsule Take 1 capsule (300 mg total) by mouth at bedtime. (Patient not taking: Reported on 08/12/2023) 30 capsule 6   No facility-administered medications prior to visit.     ROS Review of Systems  Constitutional:  Negative for activity change and appetite change.   HENT:  Negative for sinus pressure and sore throat.   Respiratory:  Negative for chest tightness, shortness of breath and wheezing.   Cardiovascular:  Negative for chest pain and palpitations.  Gastrointestinal:  Negative for abdominal distention, abdominal pain and constipation.  Genitourinary: Negative.   Musculoskeletal: Negative.   Psychiatric/Behavioral:  Negative for behavioral problems and dysphoric mood.     Objective:  BP 138/84   Pulse 85   Ht 6' (1.829 m)   Wt 281 lb (127.5 kg)   SpO2 94%   BMI 38.11 kg/m      08/12/2023    2:15 PM 05/08/2023    2:18 PM 03/04/2023    4:32 PM  BP/Weight  Systolic BP 138 138 167  Diastolic BP 84 88 83  Wt. (Lbs) 281 268.8   BMI 38.11 kg/m2 36.46 kg/m2       Physical Exam Constitutional:      Appearance: He is well-developed.   Cardiovascular:     Rate and Rhythm: Normal rate.     Heart sounds: Normal heart sounds. No murmur heard. Pulmonary:     Effort: Pulmonary effort is normal.     Breath sounds: Normal breath sounds. No wheezing or rales.  Chest:     Chest wall: No tenderness.  Abdominal:     General: Bowel sounds are normal. There is no distension.     Palpations: Abdomen is soft. There is no mass.     Tenderness: There is no abdominal tenderness.   Musculoskeletal:        General: Normal range of motion.     Right lower leg: No edema.     Left lower leg: No edema.   Neurological:     Mental Status: He is alert and oriented to person,  place, and time.   Psychiatric:        Mood and Affect: Mood normal.        Latest Ref Rng & Units 03/04/2023    4:48 PM 03/16/2022    8:36 AM 09/14/2021    6:02 PM  CMP  Glucose 70 - 99 mg/dL 409  811  914   BUN 8 - 27 mg/dL 12  11  34   Creatinine 0.76 - 1.27 mg/dL 7.82  9.56  2.13   Sodium 134 - 144 mmol/L 139  142  136   Potassium 3.5 - 5.2 mmol/L 4.5  4.6  4.1   Chloride 96 - 106 mmol/L 101  106  103   CO2 20 - 29 mmol/L 26  23  24    Calcium  8.6 - 10.2 mg/dL 9.5   9.2  9.4   Total Protein 6.0 - 8.5 g/dL 7.6  7.0    Total Bilirubin 0.0 - 1.2 mg/dL <0.8  0.2    Alkaline Phos 44 - 121 IU/L 84  109    AST 0 - 40 IU/L 20  16    ALT 0 - 44 IU/L 13  19      Lipid Panel     Component Value Date/Time   CHOL 102 03/16/2022 0836   TRIG 145 03/16/2022 0836   HDL 31 (L) 03/16/2022 0836   CHOLHDL 3.4 12/16/2019 1140   CHOLHDL 3.6 08/08/2015 0845   VLDL 34 (H) 08/08/2015 0845   LDLCALC 46 03/16/2022 0836    CBC    Component Value Date/Time   WBC 14.1 (H) 09/14/2021 1802   RBC 5.21 09/14/2021 1802   HGB 14.3 09/14/2021 1802   HGB 13.2 08/28/2018 1507   HCT 43.9 09/14/2021 1802   HCT 39.0 08/28/2018 1507   PLT 351 09/14/2021 1802   PLT 274 08/28/2018 1507   MCV 84.3 09/14/2021 1802   MCV 84 08/28/2018 1507   MCH 27.4 09/14/2021 1802   MCHC 32.6 09/14/2021 1802   RDW 13.6 09/14/2021 1802   RDW 13.4 08/28/2018 1507   LYMPHSABS 3.5 09/14/2021 1802   LYMPHSABS 3.6 (H) 08/28/2018 1507   MONOABS 1.0 09/14/2021 1802   EOSABS 0.2 09/14/2021 1802   EOSABS 0.2 08/28/2018 1507   BASOSABS 0.1 09/14/2021 1802   BASOSABS 0.0 08/28/2018 1507    Lab Results  Component Value Date   HGBA1C 8.7 (A) 08/12/2023    Lab Results  Component Value Date   HGBA1C 8.7 (A) 08/12/2023   HGBA1C 8.2 (A) 03/04/2023   HGBA1C 8.0 (A) 11/27/2022     Lab Results  Component Value Date   TSH 12.800 (H) 03/04/2023      1. Type 2 diabetes mellitus with hyperglycemia, with long-term current use of insulin  (HCC) (Primary) Uncontrolled with A1c of 8.7, goal is less than 7.0 Weight gain of 12 pounds over the last 3 months could be contributing Switched from Trulicity  to Mounjaro for additional weight loss which will also improve glycemic control He has had occasional hypoglycemic values which is discordant with his A1c of 8.7 Management of hypoglycemia discussed and advised to eat a late night snack - POCT glycosylated hemoglobin (Hb A1C) - atorvastatin  (LIPITOR) 40  MG tablet; Take 1 tablet (40 mg total) by mouth daily.  Dispense: 90 tablet; Refill: 1 - empagliflozin  (JARDIANCE ) 25 MG TABS tablet; Take 1 tablet (25 mg total) by mouth daily before breakfast.  Dispense: 90 tablet; Refill: 1 - insulin  glargine (LANTUS  SOLOSTAR) 100 UNIT/ML  Solostar Pen; Inject 50 Units into the skin daily.  Dispense: 30 mL; Refill: 3 - metFORMIN  (GLUCOPHAGE ) 500 MG tablet; Take 2 tablets (1,000 mg total) by mouth 2 (two) times daily with a meal.  Dispense: 360 tablet; Refill: 1 - Microalbumin / creatinine urine ratio; Future - LP+Non-HDL Cholesterol; Future - CMP14+EGFR; Future - tirzepatide (MOUNJARO) 12.5 MG/0.5ML Pen; Inject 12.5 mg into the skin once a week.  Dispense: 6 mL; Refill: 6  2. Hypertension associated with diabetes (HCC) Controlled Continue current regimen Counseled on blood pressure goal of less than 130/80, low-sodium, DASH diet, medication compliance, 150 minutes of moderate intensity exercise per week. Discussed medication compliance, adverse effects. - amLODipine  (NORVASC ) 10 MG tablet; Take 1 tablet (10 mg total) by mouth daily.  Dispense: 90 tablet; Refill: 1 - losartan  (COZAAR ) 100 MG tablet; Take 1 tablet (100 mg total) by mouth daily.  Dispense: 90 tablet; Refill: 1  3. Acquired hypothyroidism Uncontrolled Will send off thyroid  labs and adjust regimen accordingly - T4, free; Future - TSH; Future - T3; Future  4. Gastroesophageal reflux disease without esophagitis Stable - omeprazole  (PRILOSEC) 20 MG capsule; Take 1 capsule (20 mg total) by mouth daily.  Dispense: 90 capsule; Refill: 1  5. Screening for prostate cancer Presence of BPH symptoms which he states are not bothersome - PSA, total and free  6. Long-term current use of injectable noninsulin antidiabetic medication - tirzepatide (MOUNJARO) 12.5 MG/0.5ML Pen; Inject 12.5 mg into the skin once a week.  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    Dispense:  90 tablet    Refill:  1   atorvastatin  (LIPITOR) 40 MG tablet    Sig: Take 1 tablet (40 mg total) by mouth daily.    Dispense:  90 tablet    Refill:  1   empagliflozin  (JARDIANCE ) 25 MG TABS tablet    Sig: Take 1 tablet (25 mg total) by mouth daily before breakfast.    Dispense:  90 tablet    Refill:  1   insulin  glargine (LANTUS  SOLOSTAR) 100 UNIT/ML Solostar Pen    Sig: Inject 50 Units into the skin daily.    Dispense:  30 mL    Refill:  3   losartan  (COZAAR ) 100 MG tablet    Sig: Take 1 tablet (100 mg total) by mouth daily.    Dispense:  90 tablet    Refill:  1   metFORMIN  (GLUCOPHAGE ) 500 MG tablet    Sig: Take 2 tablets (1,000 mg total) by mouth 2 (two) times daily with a meal.    Dispense:  360 tablet    Refill:  1   omeprazole  (PRILOSEC) 20 MG capsule    Sig: Take 1 capsule (20 mg total) by mouth daily.    Dispense:  90 capsule    Refill:  1   tirzepatide (MOUNJARO) 12.5 MG/0.5ML Pen    Sig: Inject 12.5 mg into the skin once a week.    Dispense:  6 mL    Refill:  6    Discontinue Trulicity     Follow-up: Return for Blood sugar evaluation with [place], Medical conditions with PCP, in 3 months.       Joaquin Mulberry, MD, FAAFP. [facility]  [place], [place] [phone]   08/12/2023, 3:48 PM

## 2023-08-13 ENCOUNTER — Ambulatory Visit: Attending: Family Medicine

## 2023-08-13 ENCOUNTER — Other Ambulatory Visit: Payer: Self-pay

## 2023-08-13 DIAGNOSIS — E1165 Type 2 diabetes mellitus with hyperglycemia: Secondary | ICD-10-CM | POA: Diagnosis not present

## 2023-08-13 DIAGNOSIS — E039 Hypothyroidism, unspecified: Secondary | ICD-10-CM

## 2023-08-13 DIAGNOSIS — Z794 Long term (current) use of insulin: Secondary | ICD-10-CM | POA: Diagnosis not present

## 2023-08-15 ENCOUNTER — Ambulatory Visit: Payer: Self-pay | Admitting: Family Medicine

## 2023-08-15 LAB — CMP14+EGFR
ALT: 12 IU/L (ref 0–44)
AST: 15 IU/L (ref 0–40)
Albumin: 4.3 g/dL (ref 3.9–4.9)
Alkaline Phosphatase: 104 IU/L (ref 44–121)
BUN/Creatinine Ratio: 10 (ref 10–24)
BUN: 10 mg/dL (ref 8–27)
Bilirubin Total: 0.3 mg/dL (ref 0.0–1.2)
CO2: 21 mmol/L (ref 20–29)
Calcium: 9.4 mg/dL (ref 8.6–10.2)
Chloride: 102 mmol/L (ref 96–106)
Creatinine, Ser: 1.05 mg/dL (ref 0.76–1.27)
Globulin, Total: 3.1 g/dL (ref 1.5–4.5)
Glucose: 147 mg/dL — ABNORMAL HIGH (ref 70–99)
Potassium: 4.3 mmol/L (ref 3.5–5.2)
Sodium: 141 mmol/L (ref 134–144)
Total Protein: 7.4 g/dL (ref 6.0–8.5)
eGFR: 78 mL/min/{1.73_m2} (ref >59)

## 2023-08-15 LAB — T3: T3, Total: 67 ng/dL — ABNORMAL LOW (ref 71–180)

## 2023-08-15 LAB — T4, FREE: Free T4: 0.82 ng/dL (ref 0.82–1.77)

## 2023-08-15 LAB — MICROALBUMIN / CREATININE URINE RATIO
Creatinine, Urine: 96.7 mg/dL
Microalb/Creat Ratio: 4 mg/g{creat} (ref 0–29)
Microalbumin, Urine: 3.7 ug/mL

## 2023-08-15 LAB — LP+NON-HDL CHOLESTEROL
Cholesterol, Total: 128 mg/dL (ref 100–199)
HDL: 35 mg/dL — ABNORMAL LOW (ref >39)
LDL Chol Calc (NIH): 66 mg/dL (ref 0–99)
Total Non-HDL-Chol (LDL+VLDL): 93 mg/dL (ref 0–129)
Triglycerides: 153 mg/dL — ABNORMAL HIGH (ref 0–149)
VLDL Cholesterol Cal: 27 mg/dL (ref 5–40)

## 2023-08-15 LAB — TSH: TSH: 5.99 u[IU]/mL — ABNORMAL HIGH (ref 0.450–4.500)

## 2023-09-12 ENCOUNTER — Ambulatory Visit: Attending: Family Medicine | Admitting: Pharmacist

## 2023-09-12 ENCOUNTER — Other Ambulatory Visit: Payer: Self-pay

## 2023-09-12 ENCOUNTER — Encounter: Payer: Self-pay | Admitting: Pharmacist

## 2023-09-12 DIAGNOSIS — E1165 Type 2 diabetes mellitus with hyperglycemia: Secondary | ICD-10-CM

## 2023-09-12 DIAGNOSIS — Z794 Long term (current) use of insulin: Secondary | ICD-10-CM

## 2023-09-12 DIAGNOSIS — Z7984 Long term (current) use of oral hypoglycemic drugs: Secondary | ICD-10-CM | POA: Diagnosis not present

## 2023-09-12 DIAGNOSIS — Z7985 Long-term (current) use of injectable non-insulin antidiabetic drugs: Secondary | ICD-10-CM | POA: Diagnosis not present

## 2023-09-12 MED ORDER — TIRZEPATIDE 15 MG/0.5ML ~~LOC~~ SOAJ
15.0000 mg | SUBCUTANEOUS | 1 refills | Status: DC
  Filled 2023-09-12: qty 6, 84d supply, fill #0
  Filled 2023-09-24: qty 2, 28d supply, fill #0
  Filled 2023-10-23: qty 2, 28d supply, fill #1
  Filled 2023-11-29 – 2023-12-11 (×3): qty 2, 28d supply, fill #2
  Filled 2024-01-17: qty 2, 28d supply, fill #3

## 2023-09-12 NOTE — Progress Notes (Signed)
 S:    PCP: Dr. Delbert PMH: T2DM, HTN, hypothyroidism, gout  Patient arrives in good spirits. Presents for diabetes evaluation, education, and management. Patient was referred and last seen by PCP on 08/12/2023. A1c at that visit was up from 8.3 to 8.7%. Trulicity  was changed to Mounjaro .   Today, he reports in good spirits. I have not seen him since 10/2021. He tells me today he is tolerating the Mounjaro . Denies any NV, epigastric pain. No changes in vision. He is adherent to this, metformin , Jardiance , and daily insulin . He has some bilateral lower extremity swelling. Labs from last month normal and he has no hx of CHF or venous stasis. 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   Family/Social History:  -Fhx: no pertinent positives -Tobacco use: Former smoker (quit 1985)  Insurance coverage/medication affordability: [employer]  Medication adherence reported. Fill history looks up to date.  Current diabetes medications include: Basaglar  50 units daily (filled 08/12/23 - 60 day supply), Mounjaro  12.5mg  weekly (filled 08/13/23 for an 84 day supply), metformin  1000 mg BID (filled 08/12/23 - 90 day), Jardiance  25 mg daily (filled 08/12/23 - 90 day)  Patient denies hypoglycemic events.   Patient reported dietary habits:  Breakfast: green beans, ham biscuit  Lunch: green beans, roasted chicken, limits fast food, steak every now and then, fish, [place] Dinner: limits starches Snacks: ritz crackers with cheese Drinks: water, cranberry juice, limits sodas, Gatorade every now and then  Patient-reported exercise habits: 20-30 minutes every other day   Patient denies nocturia (nighttime urination).  Patient denies neuropathy (nerve pain). Patient denies visual changes. Patient reports self foot exams.    O:   CGM data from Freestyle meter: 144, 186, 139, 118, 141, 57   Lab Results  Component Value Date   HGBA1C 8.7 (A) 08/12/2023   There were no  vitals filed for this visit.  Lipid Panel     Component Value Date/Time   CHOL 128 08/13/2023 0918   TRIG 153 (H) 08/13/2023 0918   HDL 35 (L) 08/13/2023 0918   CHOLHDL 3.4 12/16/2019 1140   CHOLHDL 3.6 08/08/2015 0845   VLDL 34 (H) 08/08/2015 0845   LDLCALC 66 08/13/2023 0918    Clinical Atherosclerotic Cardiovascular Disease (ASCVD): No  The ASCVD Risk score (Arnett DK, et al., 2019) failed to calculate for the following reasons:   The valid total cholesterol range is 130 to 320 mg/dL    A/P: Diabetes longstanding currently uncontrolled with A1C, however, sugars at home are okay. He is not symptomatic at this time. He is tolerating the change to Mounjaro  well and medication adherence appears optimal overall. I offered to have him return in 1 month for follow-up, however, his schedule will not allow. He sees Dr. Newlin in 2 months. We could increase his Mounjaro  today but he picked up a 3 month supply of the 12.5 mg dose last month. He would prefer to finish this but is amenable to me sending a rxn for 15mg  weekly for him to pick up at his next fill date.  -Continued Lantus  50 units daily. -Continued Mounjaro  12.5mg  weekly. Pt will finish his supply on hand and then increase to 15 mg weekly thereafter.  -Continued Metformin  1000 mg BID. -Continued Jardiance  25 mg daily.  -Extensively discussed pathophysiology of diabetes, recommended lifestyle interventions, dietary effects on blood sugar control -Counseled on s/sx of and management of hypoglycemia and patient verbalized understanding. -Next A1C anticipated 10/2023.  Written patient instructions provided.  Total time in face to face counseling 25 minutes.   Follow up w/ PCP in 2 months.   Herlene Fleeta Morris, PharmD, JAQUELINE, CPP Clinical Pharmacist [facility] [phone]

## 2023-09-18 ENCOUNTER — Other Ambulatory Visit (HOSPITAL_BASED_OUTPATIENT_CLINIC_OR_DEPARTMENT_OTHER): Admitting: Pharmacist

## 2023-09-18 DIAGNOSIS — E7800 Pure hypercholesterolemia, unspecified: Secondary | ICD-10-CM

## 2023-09-18 NOTE — Progress Notes (Signed)
 Pharmacy Quality Measure Review  This patient is appearing on a report for being at risk of failing the adherence measure for cholesterol (statin) medications this calendar year.   Medication: atorvastatin  Last fill date: 08/13/2023 for 90 day supply  Insurance report was not up to date. No action needed at this time.   Herlene Fleeta Morris, PharmD, JAQUELINE, CPP Clinical Pharmacist [facility] [phone]

## 2023-09-20 ENCOUNTER — Other Ambulatory Visit: Payer: Self-pay

## 2023-09-23 ENCOUNTER — Other Ambulatory Visit: Payer: Self-pay

## 2023-09-24 ENCOUNTER — Other Ambulatory Visit: Payer: Self-pay

## 2023-10-09 ENCOUNTER — Ambulatory Visit: Payer: Medicare PPO | Attending: Family Medicine

## 2023-10-09 ENCOUNTER — Other Ambulatory Visit: Payer: Self-pay

## 2023-10-09 ENCOUNTER — Other Ambulatory Visit (HOSPITAL_BASED_OUTPATIENT_CLINIC_OR_DEPARTMENT_OTHER): Payer: Self-pay | Admitting: Pharmacist

## 2023-10-09 VITALS — Ht 72.0 in | Wt 281.0 lb

## 2023-10-09 DIAGNOSIS — Z0000 Encounter for general adult medical examination without abnormal findings: Secondary | ICD-10-CM | POA: Diagnosis not present

## 2023-10-09 DIAGNOSIS — Z1211 Encounter for screening for malignant neoplasm of colon: Secondary | ICD-10-CM

## 2023-10-09 DIAGNOSIS — E785 Hyperlipidemia, unspecified: Secondary | ICD-10-CM

## 2023-10-09 NOTE — Patient Instructions (Addendum)
 Ray Garner , Thank you for taking time out of your busy schedule to complete your Annual Wellness Visit with me. I enjoyed our conversation and look forward to speaking with you again next year. I, as well as your care team,  appreciate your ongoing commitment to your health goals. Please review the following plan we discussed and let me know if I can assist you in the future. Your Game plan/ To Do List    Referrals: If you haven't heard from the office you've been referred to, please reach out to them at the phone provided.   Follow up Visits: We will see or speak with you next year for your Next Medicare AWV with our clinical staff Have you seen your provider in the last 6 months (3 months if uncontrolled diabetes)? Yes  Clinician Recommendations:  Aim for 30 minutes of exercise or brisk walking, 6-8 glasses of water, and 5 servings of fruits and vegetables each day.       This is a list of the screenings recommended for you:  Health Maintenance  Topic Date Due   Eye exam for diabetics  10/07/2019   COVID-19 Vaccine (3 - 2024-25 season) 10/28/2022   Flu Shot  09/27/2023   Colon Cancer Screening  10/16/2023   Zoster (Shingles) Vaccine (1 of 2) 11/12/2023*   Hemoglobin A1C  02/11/2024   Complete foot exam   03/03/2024   Yearly kidney function blood test for diabetes  08/12/2024   Yearly kidney health urinalysis for diabetes  08/12/2024   Medicare Annual Wellness Visit  10/08/2024   DTaP/Tdap/Td vaccine (2 - Td or Tdap) 04/27/2028   Pneumococcal Vaccine for age over 22  Completed   Hepatitis C Screening  Completed   Hepatitis B Vaccine  Aged Out   HPV Vaccine  Aged Out   Meningitis B Vaccine  Aged Out  *Topic was postponed. The date shown is not the original due date.    Advanced directives: (Declined) Advance directive discussed with you today. Even though you declined this today, please call our office should you change your mind, and we can give you the proper paperwork for you to  fill out. Advance Care Planning is important because it:  [x]  Makes sure you receive the medical care that is consistent with your values, goals, and preferences  [x]  It provides guidance to your family and loved ones and reduces their decisional burden about whether or not they are making the right decisions based on your wishes.  Follow the link provided in your after visit summary or read over the paperwork we have mailed to you to help you started getting your Advance Directives in place. If you need assistance in completing these, please reach out to us  so that we can help you!  See attachments for Preventive Care and Fall Prevention Tips.

## 2023-10-09 NOTE — Progress Notes (Addendum)
 Because this visit was a virtual/telehealth visit,  certain criteria was not obtained, such a blood pressure, CBG if applicable, and timed get up and go. Any medications not marked as taking were not mentioned during the medication reconciliation part of the visit. Any vitals not documented were not able to be obtained due to this being a telehealth visit or patient was unable to self-report a recent blood pressure reading due to a lack of equipment at home via telehealth. Vitals that have been documented are verbally provided by the patient.   Subjective:   Ray Garner is a 68 y.o. who presents for a Medicare Wellness preventive visit.  As a reminder, Annual Wellness Visits don't include a physical exam, and some assessments may be limited, especially if this visit is performed virtually. We may recommend an in-person follow-up visit with your provider if needed.  Visit Complete: Virtual I connected with  Ray Garner on 10/09/23 by a audio enabled telemedicine application and verified that I am speaking with the correct person using two identifiers.  Patient Location: Home  Provider Location: Home Office  I discussed the limitations of evaluation and management by telemedicine. The patient expressed understanding and agreed to proceed.  Vital Signs: Because this visit was a virtual/telehealth visit, some criteria may be missing or patient reported. Any vitals not documented were not able to be obtained and vitals that have been documented are patient reported.  VideoDeclined- This patient declined Librarian, academic. Therefore the visit was completed with audio only.  Persons Participating in Visit: Patient.  AWV Questionnaire: No: Patient Medicare AWV questionnaire was not completed prior to this visit.  Cardiac Risk Factors include: advanced age (>57men, >61 women);male gender;obesity (BMI >30kg/m2);hypertension;diabetes mellitus     Objective:     Today's Vitals   10/09/23 1342  Weight: 281 lb (127.5 kg)  Height: 6' (1.829 m)  PainSc: 0-No pain   Body mass index is 38.11 kg/m.     10/09/2023    1:44 PM 12/17/2022    8:02 AM 10/03/2022    2:19 PM 09/11/2020    1:39 PM 01/16/2020   10:54 AM 09/15/2019    2:21 PM 07/26/2017   10:40 AM  Advanced Directives  Does Patient Have a Medical Advance Directive? No No No No No No No   Does patient want to make changes to medical advance directive?    No - Patient declined No - Patient declined    Would patient like information on creating a medical advance directive? No - Patient declined Yes (MAU/Ambulatory/Procedural Areas - Information given) Yes (MAU/Ambulatory/Procedural Areas - Information given) No - Patient declined   No - Patient declined      Data saved with a previous flowsheet row definition    Current Medications (verified) Outpatient Encounter Medications as of 10/09/2023  Medication Sig   allopurinol  (ZYLOPRIM ) 300 MG tablet Take 1 tablet (300 mg total) by mouth daily.   amLODipine  (NORVASC ) 10 MG tablet Take 1 tablet (10 mg total) by mouth daily.   atorvastatin  (LIPITOR) 40 MG tablet Take 1 tablet (40 mg total) by mouth daily.   carvedilol  (COREG ) 6.25 MG tablet TAKE 1 TABLET (6.25 MG TOTAL) BY MOUTH 2 (TWO) TIMES DAILY WITH A MEAL.   Continuous Blood Gluc Receiver (FREESTYLE LIBRE 14 DAY READER) DEVI 1 each by Does not apply route 3 (three) times daily with meals.   Continuous Blood Gluc Sensor (FREESTYLE LIBRE 14 DAY SENSOR) MISC 1 each by Does not  apply route 3 (three) times daily with meals.   cyclobenzaprine  (FLEXERIL ) 10 MG tablet Take 1 tablet (10 mg total) by mouth 2 (two) times daily as needed for muscle spasms.   empagliflozin  (JARDIANCE ) 25 MG TABS tablet Take 1 tablet (25 mg total) by mouth daily before breakfast.   ergocalciferol  (DRISDOL ) 1.25 MG (50000 UT) capsule Take 1 capsule (50,000 Units total) by mouth once a week.   fluticasone  (FLONASE ) 50 MCG/ACT  nasal spray Place 2 sprays into both nostrils daily.   gabapentin  (NEURONTIN ) 300 MG capsule Take 1 capsule (300 mg total) by mouth at bedtime. (Patient not taking: Reported on 08/12/2023)   glucose blood (BAYER CONTOUR TEST) test strip Use as instructed   hydrocortisone  1 % ointment Apply 1 application topically 2 (two) times daily.   ibuprofen  (ADVIL ) 800 MG tablet Take 1 tablet (800 mg total) by mouth 3 (three) times daily.   insulin  glargine (LANTUS  SOLOSTAR) 100 UNIT/ML Solostar Pen Inject 50 Units into the skin daily.   Insulin  Pen Needle (TRUEPLUS PEN NEEDLES) 31G X 6 MM MISC USE AS DIRECTED.   levothyroxine  (SYNTHROID ) 125 MCG tablet Take 2 tablets (250 mcg total) by mouth daily before breakfast.   losartan  (COZAAR ) 100 MG tablet Take 1 tablet (100 mg total) by mouth daily.   losartan  (COZAAR ) 100 MG tablet Take 1 tablet (100 mg total) by mouth daily.   meclizine  (ANTIVERT ) 25 MG tablet Take 1 tablet (25 mg total) by mouth 2 (two) times daily as needed for dizziness.   meloxicam  (MOBIC ) 7.5 MG tablet Take 1 tablet (7.5 mg total) by mouth 2 (two) times daily.   metFORMIN  (GLUCOPHAGE ) 500 MG tablet Take 2 tablets (1,000 mg total) by mouth 2 (two) times daily with a meal.   metoCLOPramide  (REGLAN ) 5 MG tablet Take 1 tablet (5 mg total) by mouth 3 (three) times daily before meals.   omeprazole  (PRILOSEC) 20 MG capsule Take 1 capsule (20 mg total) by mouth daily.   polyethylene glycol (MIRALAX  / GLYCOLAX ) 17 g packet Take 17 g by mouth daily.   sildenafil  (VIAGRA ) 100 MG tablet Take 1 tablet (100 mg total) by mouth daily as needed for erectile dysfunction.   Spacer/Aero-Holding Chambers (AEROCHAMBER PLUS) inhaler Use as instructed   tirzepatide  (MOUNJARO ) 15 MG/0.5ML Pen Inject 15 mg into the skin once a week.   tiZANidine  (ZANAFLEX ) 4 MG tablet Take 1 tablet (4 mg total) by mouth every 6 (six) hours as needed for muscle spasms for up to 5 days.   traMADol  (ULTRAM ) 50 MG tablet Take 1 tablet  (50 mg total) by mouth every 6 (six) hours as needed.   vitamin B-12 (CYANOCOBALAMIN) 1000 MCG tablet Take 1,000 mcg by mouth daily.   No facility-administered encounter medications on file as of 10/09/2023.    Allergies (verified) Vicodin [hydrocodone -acetaminophen ]   History: Past Medical History:  Diagnosis Date   Anemia    Arthritis    Back pain, lumbosacral 06/30/2013   Blood transfusion without reported diagnosis    Cataract    BEGINNING   DM type 2 (diabetes mellitus, type 2) (HCC) 06/30/2013   Gout    HTN (hypertension) 06/30/2013   Hypertension    Hypothyroid 06/30/2013   Past Surgical History:  Procedure Laterality Date   CYST EXCISION     FRACTURE SURGERY     Family History  Problem Relation Age of Onset   Breast cancer Niece    Colon cancer Neg Hx    Colon polyps Neg  Hx    Esophageal cancer Neg Hx    Rectal cancer Neg Hx    Stomach cancer Neg Hx    Social History   Socioeconomic History   Marital status: Divorced    Spouse name: Not on file   Number of children: Not on file   Years of education: Not on file   Highest education level: Not on file  Occupational History   Not on file  Tobacco Use   Smoking status: Former    Current packs/day: 0.00    Types: Cigarettes    Quit date: 05/14/1983    Years since quitting: 40.4   Smokeless tobacco: Former  Building services engineer status: Former  Substance and Sexual Activity   Alcohol use: Yes    Comment: OCC.   Drug use: No   Sexual activity: Not on file  Other Topics Concern   Not on file  Social History Narrative   Not on file   Social Drivers of Health   Financial Resource Strain: Low Risk  (10/09/2023)   Overall Financial Resource Strain (CARDIA)    Difficulty of Paying Living Expenses: Not hard at all  Food Insecurity: No Food Insecurity (10/09/2023)   Hunger Vital Sign    Worried About Running Out of Food in the Last Year: Never true    Ran Out of Food in the Last Year: Never true  Transportation  Needs: No Transportation Needs (10/09/2023)   PRAPARE - Administrator, Civil Service (Medical): No    Lack of Transportation (Non-Medical): No  Physical Activity: Sufficiently Active (10/09/2023)   Exercise Vital Sign    Days of Exercise per Week: 5 days    Minutes of Exercise per Session: 30 min  Stress: No Stress Concern Present (10/09/2023)   [employer] of Occupational Health - Occupational Stress Questionnaire    Feeling of Stress: Not at all  Social Connections: Moderately Integrated (10/09/2023)   Social Connection and Isolation Panel    Frequency of Communication with Friends and Family: More than three times a week    Frequency of Social Gatherings with Friends and Family: More than three times a week    Attends Religious Services: More than 4 times per year    Active Member of [employer] or Organizations: No    Attends Engineer, structural: 1 to 4 times per year    Marital Status: Divorced    Tobacco Counseling Counseling given: Not Answered    Clinical Intake:  Pre-visit preparation completed: Yes  Pain : No/denies pain Pain Score: 0-No pain     BMI - recorded: 38.11 Nutritional Status: BMI > 30  Obese Nutritional Risks: None Diabetes: Yes CBG done?: No Did pt. bring in CBG monitor from home?: No  Lab Results  Component Value Date   HGBA1C 8.7 (A) 08/12/2023   HGBA1C 8.2 (A) 03/04/2023   HGBA1C 8.0 (A) 11/27/2022     How often do you need to have someone help you when you read instructions, pamphlets, or other written materials from your doctor or pharmacy?: 1 - Never  Interpreter Needed?: No  Information entered by :: Latreshia Beauchaine N. Tiea Manninen, LPN.   Activities of Daily Living     10/09/2023    1:45 PM  In your present state of health, do you have any difficulty performing the following activities:  Hearing? 0  Vision? 0  Difficulty concentrating or making decisions? 0  Walking or climbing stairs? 0  Dressing or bathing? 0  Doing errands, shopping? 0  Preparing Food and eating ? N  Using the Toilet? N  In the past six months, have you accidently leaked urine? N  Do you have problems with loss of bowel control? N  Managing your Medications? N  Managing your Finances? N  Housekeeping or managing your Housekeeping? N    Patient Care Team: Delbert Clam, MD as PCP - [facility]  I have updated your Care Teams any recent Medical Services you may have received from other providers in the past year.     Assessment:   This is a routine wellness examination for Vincente.  Hearing/Vision screen Hearing Screening - Comments:: Denies hearing difficulties.  Vision Screening - Comments:: Wears rx glasses - up to date with routine eye exams with [facility]    Goals Addressed             This Visit's Progress    10/09/23: My goal is to lose 30-40 pounds and to get off of insulin .       Blood Pressure < 140/90       HEMOGLOBIN A1C < 7         Depression Screen     10/09/2023    1:45 PM 08/12/2023    2:17 PM 03/04/2023    3:55 PM 10/03/2022    2:20 PM 10/03/2022    2:18 PM 03/14/2022    3:59 PM 12/06/2021    3:41 PM  PHQ 2/9 Scores  PHQ - 2 Score 0 0 0 0 0 0 0  PHQ- 9 Score 0 0 0   0 0    Fall Risk     10/09/2023    1:45 PM 08/12/2023    2:17 PM 03/04/2023    3:55 PM 11/27/2022    3:34 PM 10/03/2022    2:19 PM  Fall Risk   Falls in the past year? 0 0 0 0 0  Number falls in past yr: 0 0 0 0 0  Injury with Fall? 0 0 0 0 0  Risk for fall due to : No Fall Risks No Fall Risks No Fall Risks No Fall Risks No Fall Risks  Follow up Falls evaluation completed Falls evaluation completed Falls evaluation completed Falls evaluation completed Falls prevention discussed;Education provided;Falls evaluation completed    MEDICARE RISK AT HOME:  Medicare Risk at Home Any stairs in or around the home?: Yes (BASEMENT) If so, are there any without handrails?: No Home free of  loose throw rugs in walkways, pet beds, electrical cords, etc?: Yes Adequate lighting in your home to reduce risk of falls?: Yes Life alert?: No Use of a cane, walker or w/c?: No Grab bars in the bathroom?: Yes Shower chair or bench in shower?: Yes (2ND BATHROOM) Elevated toilet seat or a handicapped toilet?: No  TIMED UP AND GO:  Was the test performed?  No  Cognitive Function: Declined/Normal: No cognitive concerns noted by patient or family. Patient alert, oriented, able to answer questions appropriately and recall recent events. No signs of memory loss or confusion.    10/09/2023    1:45 PM  MMSE - Mini Mental State Exam  Not completed: Unable to complete        10/09/2023    1:45 PM 10/03/2022    2:20 PM  6CIT Screen  What Year? 0 points 0 points  What month? 0 points 0 points  What time? 0 points 0 points  Count back from 20 0  points 0 points  Months in reverse 0 points 0 points  Repeat phrase 0 points 0 points  Total Score 0 points 0 points    Immunizations Immunization History  Administered Date(s) Administered   Fluad Quad(high Dose 65+) 12/06/2021   Fluad Trivalent(High Dose 65+) 11/27/2022   Influenza,inj,Quad PF,6+ Mos 04/28/2018, 11/12/2018, 12/16/2019   PFIZER(Purple Top)SARS-COV-2 Vaccination 08/12/2019, 09/04/2019   PNEUMOCOCCAL CONJUGATE-20 12/06/2021   Pneumococcal Polysaccharide-23 04/11/2016   Tdap 04/28/2018    Screening Tests Health Maintenance  Topic Date Due   OPHTHALMOLOGY EXAM  10/07/2019   COVID-19 Vaccine (3 - 2024-25 season) 10/28/2022   INFLUENZA VACCINE  09/27/2023   Colonoscopy  10/16/2023   Zoster Vaccines- Shingrix (1 of 2) 11/12/2023 (Originally 10/09/2006)   HEMOGLOBIN A1C  02/11/2024   FOOT EXAM  03/03/2024   Diabetic kidney evaluation - eGFR measurement  08/12/2024   Diabetic kidney evaluation - Urine ACR  08/12/2024   Medicare Annual Wellness (AWV)  10/08/2024   DTaP/Tdap/Td (2 - Td or Tdap) 04/27/2028   Pneumococcal  Vaccine: 50+ Years  Completed   Hepatitis C Screening  Completed   Hepatitis B Vaccines  Aged Out   HPV VACCINES  Aged Out   Meningococcal B Vaccine  Aged Out    Health Maintenance  Health Maintenance Due  Topic Date Due   OPHTHALMOLOGY EXAM  10/07/2019   COVID-19 Vaccine (3 - 2024-25 season) 10/28/2022   INFLUENZA VACCINE  09/27/2023   Colonoscopy  10/16/2023   Health Maintenance Items Addressed: Yes Referral sent to GI for colonoscopy  Additional Screening:  Vision Screening: Recommended annual ophthalmology exams for early detection of glaucoma and other disorders of the eye. Would you like a referral to an eye doctor? No    Dental Screening: Recommended annual dental exams for proper oral hygiene  Community Resource Referral / Chronic Care Management: CRR required this visit?  No   CCM required this visit?  No   Plan:    I have personally reviewed and noted the following in the patient's chart:   Medical and social history Use of alcohol, tobacco or illicit drugs  Current medications and supplements including opioid prescriptions. Patient is not currently taking opioid prescriptions. Functional ability and status Nutritional status Physical activity Advanced directives List of other physicians Hospitalizations, surgeries, and ER visits in previous 12 months Vitals Screenings to include cognitive, depression, and falls Referrals and appointments  In addition, I have reviewed and discussed with patient certain preventive protocols, quality metrics, and best practice recommendations. A written personalized care plan for preventive services as well as general preventive health recommendations were provided to patient.   Roz LOISE Fuller, LPN   [id number]/[id number]   After Visit Summary: (MyChart) Due to this being a telephonic visit, the after visit summary with patients personalized plan was offered to patient via MyChart   Notes: Referral to Gastroenterology for  Screening Colonoscopy.

## 2023-10-09 NOTE — Progress Notes (Signed)
 Pharmacy Quality Measure Review  This patient is appearing on a report for being at risk of failing the adherence measure for cholesterol (statin) medications this calendar year.   Medication: atorvastatin  Last fill date: 08/13/2023 for 90 day supply  Insurance report was not up to date. No action needed at this time.   Of note, I am also assisting him with DM management. He is on Jardiance , Lantus , metformin , and Mounjaro . He is due for refills of the Lantus . I have coordinated with our pharmacy to fill these.   Herlene Fleeta Morris, PharmD, JAQUELINE, CPP Clinical Pharmacist [facility] [phone]

## 2023-10-11 ENCOUNTER — Other Ambulatory Visit: Payer: Self-pay

## 2023-10-14 ENCOUNTER — Other Ambulatory Visit: Payer: Self-pay

## 2023-10-18 ENCOUNTER — Other Ambulatory Visit: Payer: Self-pay

## 2023-10-23 ENCOUNTER — Other Ambulatory Visit: Payer: Self-pay

## 2023-10-23 ENCOUNTER — Other Ambulatory Visit (HOSPITAL_BASED_OUTPATIENT_CLINIC_OR_DEPARTMENT_OTHER): Payer: Self-pay | Admitting: Pharmacist

## 2023-10-23 DIAGNOSIS — Z79899 Other long term (current) drug therapy: Secondary | ICD-10-CM

## 2023-10-23 NOTE — Progress Notes (Signed)
 Pharmacy Quality Measure Review  This patient is appearing on a report for being at risk of failing the adherence measure for cholesterol (statin) medications this calendar year.   Medication: atorvastatin  Last fill date: 08/13/2023 for 90 day supply  Insurance report was not up to date. No action needed at this time.   Of note, I am also assisting him with DM management. He is on Jardiance , Lantus , metformin , and Mounjaro . He is due for refills of the Mounjaro . I have coordinated with our pharmacy to fill this.   Herlene Fleeta Morris, PharmD, JAQUELINE, CPP Clinical Pharmacist [facility] [phone]

## 2023-11-01 ENCOUNTER — Other Ambulatory Visit: Payer: Self-pay

## 2023-11-04 ENCOUNTER — Telehealth: Payer: Self-pay

## 2023-11-04 ENCOUNTER — Other Ambulatory Visit: Payer: Self-pay

## 2023-11-04 NOTE — Telephone Encounter (Signed)
 Patient was identified as falling into the [place] Measure - Diabetes.   Patient was: Appointment scheduled with primary care provider in the next 30 days.

## 2023-11-06 ENCOUNTER — Other Ambulatory Visit: Payer: Self-pay

## 2023-11-06 ENCOUNTER — Other Ambulatory Visit: Payer: Self-pay | Admitting: Family Medicine

## 2023-11-06 DIAGNOSIS — E039 Hypothyroidism, unspecified: Secondary | ICD-10-CM

## 2023-11-06 DIAGNOSIS — M1A9XX0 Chronic gout, unspecified, without tophus (tophi): Secondary | ICD-10-CM

## 2023-11-07 ENCOUNTER — Other Ambulatory Visit: Payer: Self-pay

## 2023-11-07 MED ORDER — LEVOTHYROXINE SODIUM 125 MCG PO TABS
250.0000 ug | ORAL_TABLET | Freq: Every day | ORAL | 1 refills | Status: DC
Start: 2023-11-07 — End: 2023-11-13
  Filled 2023-11-07: qty 180, 90d supply, fill #0

## 2023-11-11 ENCOUNTER — Other Ambulatory Visit: Payer: Self-pay

## 2023-11-12 ENCOUNTER — Encounter: Payer: Self-pay | Admitting: Family Medicine

## 2023-11-12 ENCOUNTER — Ambulatory Visit: Attending: Family Medicine | Admitting: Family Medicine

## 2023-11-12 ENCOUNTER — Telehealth: Payer: Self-pay | Admitting: Pharmacist

## 2023-11-12 ENCOUNTER — Other Ambulatory Visit: Payer: Self-pay

## 2023-11-12 VITALS — BP 148/94 | HR 81 | Ht 72.0 in | Wt 285.2 lb

## 2023-11-12 DIAGNOSIS — Z7985 Long-term (current) use of injectable non-insulin antidiabetic drugs: Secondary | ICD-10-CM

## 2023-11-12 DIAGNOSIS — E1165 Type 2 diabetes mellitus with hyperglycemia: Secondary | ICD-10-CM | POA: Diagnosis not present

## 2023-11-12 DIAGNOSIS — J069 Acute upper respiratory infection, unspecified: Secondary | ICD-10-CM | POA: Diagnosis not present

## 2023-11-12 DIAGNOSIS — E1159 Type 2 diabetes mellitus with other circulatory complications: Secondary | ICD-10-CM | POA: Diagnosis not present

## 2023-11-12 DIAGNOSIS — E039 Hypothyroidism, unspecified: Secondary | ICD-10-CM

## 2023-11-12 DIAGNOSIS — Z794 Long term (current) use of insulin: Secondary | ICD-10-CM

## 2023-11-12 DIAGNOSIS — I152 Hypertension secondary to endocrine disorders: Secondary | ICD-10-CM

## 2023-11-12 DIAGNOSIS — T8090XA Unspecified complication following infusion and therapeutic injection, initial encounter: Secondary | ICD-10-CM

## 2023-11-12 DIAGNOSIS — M1A9XX0 Chronic gout, unspecified, without tophus (tophi): Secondary | ICD-10-CM | POA: Diagnosis not present

## 2023-11-12 DIAGNOSIS — E559 Vitamin D deficiency, unspecified: Secondary | ICD-10-CM | POA: Diagnosis not present

## 2023-11-12 DIAGNOSIS — Z125 Encounter for screening for malignant neoplasm of prostate: Secondary | ICD-10-CM

## 2023-11-12 DIAGNOSIS — Z79890 Hormone replacement therapy (postmenopausal): Secondary | ICD-10-CM

## 2023-11-12 DIAGNOSIS — Z7984 Long term (current) use of oral hypoglycemic drugs: Secondary | ICD-10-CM | POA: Diagnosis not present

## 2023-11-12 LAB — POCT GLYCOSYLATED HEMOGLOBIN (HGB A1C): HbA1c, POC (controlled diabetic range): 8.2 % — AB (ref 0.0–7.0)

## 2023-11-12 MED ORDER — PROMETHAZINE-DM 6.25-15 MG/5ML PO SYRP
5.0000 mL | ORAL_SOLUTION | Freq: Four times a day (QID) | ORAL | 0 refills | Status: AC | PRN
  Filled 2023-11-12: qty 118, 6d supply, fill #0

## 2023-11-12 MED ORDER — LIDOCAINE-PRILOCAINE 2.5-2.5 % EX CREA
1.0000 | TOPICAL_CREAM | CUTANEOUS | 0 refills | Status: AC | PRN
  Filled 2023-11-12: qty 30, 30d supply, fill #0

## 2023-11-12 MED ORDER — CYCLOBENZAPRINE HCL 10 MG PO TABS
10.0000 mg | ORAL_TABLET | Freq: Two times a day (BID) | ORAL | 0 refills | Status: DC | PRN
  Filled 2023-11-12: qty 20, 10d supply, fill #0

## 2023-11-12 MED ORDER — ALLOPURINOL 300 MG PO TABS
300.0000 mg | ORAL_TABLET | Freq: Every day | ORAL | 0 refills | Status: DC
  Filled 2023-11-12: qty 30, 30d supply, fill #0

## 2023-11-12 MED ORDER — LANTUS SOLOSTAR 100 UNIT/ML ~~LOC~~ SOPN
54.0000 [IU] | PEN_INJECTOR | Freq: Every day | SUBCUTANEOUS | 3 refills | Status: DC
  Filled 2023-11-12 – 2023-12-11 (×5): qty 30, 55d supply, fill #0
  Filled 2024-01-17: qty 30, 55d supply, fill #1
  Filled ????-??-??: fill #1

## 2023-11-12 MED ORDER — VALSARTAN 160 MG PO TABS
160.0000 mg | ORAL_TABLET | Freq: Every day | ORAL | 1 refills | Status: AC
  Filled 2023-11-12: qty 90, 90d supply, fill #0
  Filled 2024-02-12: qty 90, 90d supply, fill #1

## 2023-11-12 NOTE — Progress Notes (Addendum)
 Subjective:  Patient ID: Ray Garner, male    DOB: December 02, 1955  Age: 68 y.o. MRN: [id number]  CC: Medical Management of Chronic Issues (Cough started last night/Redness and itching at mounjaro  injection site.)     Discussed the use of AI scribe software for clinical note transcription with the patient, who gave verbal consent to proceed.  History of Present Illness Ray Garner is a 68 year old male with a history of Type 2 diabetes mellitus, hypertension, hypothyroidism, Gout  who presents with a cough and injection site reactions.  He has a cough that started last night, causing throat irritation and scratching sensations. He woke up with a slight cough this morning. There is no fever, and no one around him is ill. He occasionally experiences pain in his forehead or cheekbones and lower back aches, especially upon waking. No body aches, joint pains, or fatigue are present.  He experiences redness and itching at the injection sites of Mounjaro , which he did not have with Trulicity . The reactions occur at various sites, including his thigh, and have persisted for three months. The injection sites become hard and swell, with itching leading to redness. He has been on Mounjaro  since July, starting at 15 mg. A1c today is 8.2.  He has been adherent with his Lantus  and Jardiance  as well.  His current medications include Jardiance  25 mg, Mounjaro  15 mg, and insulin  50 units, with fasting blood sugars ranging from 145 to 150. He also takes allopurinol  and levothyroxine , with stable gout and a history of high blood pressure. He takes carvedilol  for blood pressure, although it has not been filled since 2023. He occasionally uses Flexeril  for back pain and over-the-counter ibuprofen  for hip and back discomfort.    Past Medical History:  Diagnosis Date   Anemia    Arthritis    Back pain, lumbosacral 06/30/2013   Blood transfusion without reported diagnosis    Cataract    BEGINNING   DM type 2  (diabetes mellitus, type 2) (HCC) 06/30/2013   Gout    HTN (hypertension) 06/30/2013   Hypertension    Hypothyroid 06/30/2013    Past Surgical History:  Procedure Laterality Date   CYST EXCISION     FRACTURE SURGERY      Family History  Problem Relation Age of Onset   Breast cancer Niece    Colon cancer Neg Hx    Colon polyps Neg Hx    Esophageal cancer Neg Hx    Rectal cancer Neg Hx    Stomach cancer Neg Hx     Social History   Socioeconomic History   Marital status: Divorced    Spouse name: Not on file   Number of children: Not on file   Years of education: Not on file   Highest education level: Not on file  Occupational History   Not on file  Tobacco Use   Smoking status: Former    Current packs/day: 0.00    Types: Cigarettes    Quit date: 05/14/1983    Years since quitting: 40.5   Smokeless tobacco: Former  Building services engineer status: Former  Substance and Sexual Activity   Alcohol use: Yes    Comment: OCC.   Drug use: No   Sexual activity: Not on file  Other Topics Concern   Not on file  Social History Narrative   Not on file   Social Drivers of Health   Financial Resource Strain: Low Risk  (10/09/2023)   Overall Financial  Resource Strain (CARDIA)    Difficulty of Paying Living Expenses: Not hard at all  Food Insecurity: No Food Insecurity (10/09/2023)   Hunger Vital Sign    Worried About Running Out of Food in the Last Year: Never true    Ran Out of Food in the Last Year: Never true  Transportation Needs: No Transportation Needs (10/09/2023)   PRAPARE - Administrator, Civil Service (Medical): No    Lack of Transportation (Non-Medical): No  Physical Activity: Sufficiently Active (10/09/2023)   Exercise Vital Sign    Days of Exercise per Week: 5 days    Minutes of Exercise per Session: 30 min  Stress: No Stress Concern Present (10/09/2023)   [employer] of Occupational Health - Occupational Stress Questionnaire    Feeling of Stress: Not  at all  Social Connections: Moderately Integrated (10/09/2023)   Social Connection and Isolation Panel    Frequency of Communication with Friends and Family: More than three times a week    Frequency of Social Gatherings with Friends and Family: More than three times a week    Attends Religious Services: More than 4 times per year    Active Member of [employer] or Organizations: No    Attends Engineer, structural: 1 to 4 times per year    Marital Status: Divorced    Allergies  Allergen Reactions   Vicodin [Hydrocodone -Acetaminophen ] Hives and Rash    Outpatient Medications Prior to Visit  Medication Sig Dispense Refill   amLODipine  (NORVASC ) 10 MG tablet Take 1 tablet (10 mg total) by mouth daily. 90 tablet 1   atorvastatin  (LIPITOR) 40 MG tablet Take 1 tablet (40 mg total) by mouth daily. 90 tablet 1   carvedilol  (COREG ) 6.25 MG tablet TAKE 1 TABLET (6.25 MG TOTAL) BY MOUTH 2 (TWO) TIMES DAILY WITH A MEAL. 60 tablet 6   Continuous Blood Gluc Receiver (FREESTYLE LIBRE 14 DAY READER) DEVI 1 each by Does not apply route 3 (three) times daily with meals. 1 Device 0   Continuous Blood Gluc Sensor (FREESTYLE LIBRE 14 DAY SENSOR) MISC 1 each by Does not apply route 3 (three) times daily with meals. 1 each 11   empagliflozin  (JARDIANCE ) 25 MG TABS tablet Take 1 tablet (25 mg total) by mouth daily before breakfast. 90 tablet 1   ergocalciferol  (DRISDOL ) 1.25 MG (50000 UT) capsule Take 1 capsule (50,000 Units total) by mouth once a week. 12 capsule 0   fluticasone  (FLONASE ) 50 MCG/ACT nasal spray Place 2 sprays into both nostrils daily. 16 g 1   glucose blood (BAYER CONTOUR TEST) test strip Use as instructed 100 each 12   hydrocortisone  1 % ointment Apply 1 application topically 2 (two) times daily. 30 g 1   Insulin  Pen Needle (TRUEPLUS PEN NEEDLES) 31G X 6 MM MISC USE AS DIRECTED. 100 each 5   levothyroxine  (SYNTHROID ) 125 MCG tablet Take 2 tablets (250 mcg total) by mouth daily before  breakfast. 180 tablet 1   metFORMIN  (GLUCOPHAGE ) 500 MG tablet Take 2 tablets (1,000 mg total) by mouth 2 (two) times daily with a meal. 360 tablet 1   metoCLOPramide  (REGLAN ) 5 MG tablet Take 1 tablet (5 mg total) by mouth 3 (three) times daily before meals. 90 tablet 0   omeprazole  (PRILOSEC) 20 MG capsule Take 1 capsule (20 mg total) by mouth daily. 90 capsule 1   sildenafil  (VIAGRA ) 100 MG tablet Take 1 tablet (100 mg total) by mouth daily as needed for erectile  dysfunction. 15 tablet 2   tirzepatide  (MOUNJARO ) 15 MG/0.5ML Pen Inject 15 mg into the skin once a week. 6 mL 1   vitamin B-12 (CYANOCOBALAMIN) 1000 MCG tablet Take 1,000 mcg by mouth daily.     allopurinol  (ZYLOPRIM ) 300 MG tablet Take 1 tablet (300 mg total) by mouth daily. 30 tablet 0   cyclobenzaprine  (FLEXERIL ) 10 MG tablet Take 1 tablet (10 mg total) by mouth 2 (two) times daily as needed for muscle spasms. 20 tablet 0   gabapentin  (NEURONTIN ) 300 MG capsule Take 1 capsule (300 mg total) by mouth at bedtime. 30 capsule 6   ibuprofen  (ADVIL ) 800 MG tablet Take 1 tablet (800 mg total) by mouth 3 (three) times daily. 21 tablet 0   insulin  glargine (LANTUS  SOLOSTAR) 100 UNIT/ML Solostar Pen Inject 50 Units into the skin daily. 30 mL 3   losartan  (COZAAR ) 100 MG tablet Take 1 tablet (100 mg total) by mouth daily. 30 tablet 6   losartan  (COZAAR ) 100 MG tablet Take 1 tablet (100 mg total) by mouth daily. 90 tablet 1   meclizine  (ANTIVERT ) 25 MG tablet Take 1 tablet (25 mg total) by mouth 2 (two) times daily as needed for dizziness. 30 tablet 0   meloxicam  (MOBIC ) 7.5 MG tablet Take 1 tablet (7.5 mg total) by mouth 2 (two) times daily. 28 tablet 0   polyethylene glycol (MIRALAX  / GLYCOLAX ) 17 g packet Take 17 g by mouth daily. 14 each 0   Spacer/Aero-Holding Chambers (AEROCHAMBER PLUS) inhaler Use as instructed 1 each 2   tiZANidine  (ZANAFLEX ) 4 MG tablet Take 1 tablet (4 mg total) by mouth every 6 (six) hours as needed for muscle spasms  for up to 5 days. 20 tablet 0   traMADol  (ULTRAM ) 50 MG tablet Take 1 tablet (50 mg total) by mouth every 6 (six) hours as needed. 15 tablet 0   No facility-administered medications prior to visit.     ROS Review of Systems  Constitutional:  Negative for activity change and appetite change.  HENT:  Negative for sinus pressure and sore throat.   Respiratory:  Positive for cough. Negative for chest tightness, shortness of breath and wheezing.   Cardiovascular:  Negative for chest pain and palpitations.  Gastrointestinal:  Negative for abdominal distention, abdominal pain and constipation.  Genitourinary: Negative.   Musculoskeletal: Negative.   Psychiatric/Behavioral:  Negative for behavioral problems and dysphoric mood.     Objective:  BP (!) 148/94   Pulse 81   Ht 6' (1.829 m)   Wt 285 lb 3.2 oz (129.4 kg)   SpO2 96%   BMI 38.68 kg/m      11/12/2023    1:02 PM 11/12/2023    9:01 AM 10/09/2023    1:42 PM  BP/Weight  Systolic BP 148 159   Diastolic BP 94 94   Wt. (Lbs)  285.2 281  BMI  38.68 kg/m2 38.11 kg/m2    Wt Readings from Last 3 Encounters:  11/12/23 285 lb 3.2 oz (129.4 kg)  10/09/23 281 lb (127.5 kg)  08/12/23 281 lb (127.5 kg)      Physical Exam Constitutional:      Appearance: He is well-developed.  HENT:     Head:     Comments: No sinus tenderness on palpation    Mouth/Throat:     Mouth: Mucous membranes are moist.  Cardiovascular:     Rate and Rhythm: Normal rate.     Heart sounds: Normal heart sounds. No murmur heard. Pulmonary:  Effort: Pulmonary effort is normal.     Breath sounds: Normal breath sounds. No wheezing or rales.  Chest:     Chest wall: No tenderness.  Abdominal:     General: Bowel sounds are normal. There is no distension.     Palpations: Abdomen is soft. There is no mass.     Tenderness: There is no abdominal tenderness.  Musculoskeletal:        General: Normal range of motion.     Right lower leg: No edema.     Left  lower leg: No edema.  Neurological:     Mental Status: He is alert and oriented to person, place, and time.  Psychiatric:        Mood and Affect: Mood normal.        Latest Ref Rng & Units 08/13/2023    9:18 AM 03/04/2023    4:48 PM 03/16/2022    8:36 AM  CMP  Glucose 70 - 99 mg/dL 852  876  832   BUN 8 - 27 mg/dL 10  12  11    Creatinine 0.76 - 1.27 mg/dL 8.94  8.93  9.00   Sodium 134 - 144 mmol/L 141  139  142   Potassium 3.5 - 5.2 mmol/L 4.3  4.5  4.6   Chloride 96 - 106 mmol/L 102  101  106   CO2 20 - 29 mmol/L 21  26  23    Calcium  8.6 - 10.2 mg/dL 9.4  9.5  9.2   Total Protein 6.0 - 8.5 g/dL 7.4  7.6  7.0   Total Bilirubin 0.0 - 1.2 mg/dL 0.3  <9.7  0.2   Alkaline Phos 44 - 121 IU/L 104  84  109   AST 0 - 40 IU/L 15  20  16    ALT 0 - 44 IU/L 12  13  19      Lipid Panel     Component Value Date/Time   CHOL 128 08/13/2023 0918   TRIG 153 (H) 08/13/2023 0918   HDL 35 (L) 08/13/2023 0918   CHOLHDL 3.4 12/16/2019 1140   CHOLHDL 3.6 08/08/2015 0845   VLDL 34 (H) 08/08/2015 0845   LDLCALC 66 08/13/2023 0918    CBC    Component Value Date/Time   WBC 14.1 (H) 09/14/2021 1802   RBC 5.21 09/14/2021 1802   HGB 14.3 09/14/2021 1802   HGB 13.2 08/28/2018 1507   HCT 43.9 09/14/2021 1802   HCT 39.0 08/28/2018 1507   PLT 351 09/14/2021 1802   PLT 274 08/28/2018 1507   MCV 84.3 09/14/2021 1802   MCV 84 08/28/2018 1507   MCH 27.4 09/14/2021 1802   MCHC 32.6 09/14/2021 1802   RDW 13.6 09/14/2021 1802   RDW 13.4 08/28/2018 1507   LYMPHSABS 3.5 09/14/2021 1802   LYMPHSABS 3.6 (H) 08/28/2018 1507   MONOABS 1.0 09/14/2021 1802   EOSABS 0.2 09/14/2021 1802   EOSABS 0.2 08/28/2018 1507   BASOSABS 0.1 09/14/2021 1802   BASOSABS 0.0 08/28/2018 1507    Lab Results  Component Value Date   HGBA1C 8.2 (A) 11/12/2023    Lab Results  Component Value Date   TSH 5.990 (H) 08/13/2023       Assessment & Plan Upper respiratory tract infection with postnasal drip Acute  infection with postnasal drip causing cough. Lungs clear, no fever, no antibiotics needed. Symptoms likely from sinus drainage. - Prescribe cough syrup  - Advise supportive treatment. - Instruct to monitor for fever or chills and  report if symptoms worsen.  Type 2 diabetes mellitus with hyperglycemia Long-term insulin  use Long-term use of non-insulin  injectable A1c decreased to 8.2 but remains elevated. Fasting blood sugars 145-150 mg/dL. Current regimen includes Jardiance , Mounjaro , and insulin  glargine. Injection site reactions with Mounjaro  but prefers to continue. Discussed potential switch to Ozempic if side effects persist. -Apply Emla  cream at injection sites and continue to rotate injection sites - Increase insulin  glargine to 54 units at night. - Continue Jardiance  and Mounjaro . - Monitor blood sugar levels. - Discuss potential switch to Ozempic if Mounjaro  side effects persist.  Injection site reaction due to Mounjaro  Experiencing itching, burning, and swelling at injection sites with Mounjaro . Prefers to continue Mounjaro . Discussed Emla  cream for symptom relief. - Prescribe Emla  cream to apply after injections to reduce itching and burning. - Monitor for continued reactions and consider switching to Ozempic if intolerable.  Hypertension associated with diabetes Blood pressure remains elevated. Losartan  may not provide adequate control. Plan to switch to valsartan . - Switch from losartan  to valsartan  for better blood pressure control. - Advise continuation of current medications and a low sodium diet.  Acquired hypothyroidism Last thyroid  test showed elevated TSH. Current medication is levothyroxine . Plan to reassess thyroid  function. - Order repeat thyroid  function tests to assess current TSH levels. - Adjust levothyroxine  dose based on test results.  Gout Well-managed with no recent flares since starting allopurinol . Refill needed. - Refill allopurinol   prescription.  Chronic back pain Intermittent pain managed with Flexeril . No recent use of methocarbamol or gabapentin . Plan to discontinue unnecessary medications. - Continue Flexeril  as needed for back pain. - Discontinue methocarbamol and gabapentin .   Vitamin D  deficiency Completed Drisdol  Will check level again  Healthcare maintenance Screening for prostate cancer -PSA ordered  Meds ordered this encounter  Medications   lidocaine -prilocaine  (EMLA ) cream    Sig: Apply 1 Application topically as needed.    Dispense:  30 g    Refill:  0   insulin  glargine (LANTUS  SOLOSTAR) 100 UNIT/ML Solostar Pen    Sig: Inject 54 Units into the skin daily.    Dispense:  30 mL    Refill:  3    Dose increase   allopurinol  (ZYLOPRIM ) 300 MG tablet    Sig: Take 1 tablet (300 mg total) by mouth daily.    Dispense:  30 tablet    Refill:  0   cyclobenzaprine  (FLEXERIL ) 10 MG tablet    Sig: Take 1 tablet (10 mg total) by mouth 2 (two) times daily as needed for muscle spasms.    Dispense:  20 tablet    Refill:  0   promethazine -dextromethorphan (PROMETHAZINE -DM) 6.25-15 MG/5ML syrup    Sig: Take 5 mLs by mouth 4 (four) times daily as needed for cough.    Dispense:  118 mL    Refill:  0   valsartan  (DIOVAN ) 160 MG tablet    Sig: Take 1 tablet (160 mg total) by mouth daily.    Dispense:  90 tablet    Refill:  1    Discontinue Losartan     Follow-up: Return in about 3 months (around 02/11/2024) for Chronic medical conditions.       Corrina Sabin, MD, FAAFP. [facility] [place], [place] [phone]   11/12/2023, 1:03 PM

## 2023-11-12 NOTE — Telephone Encounter (Signed)
 Patient saw Dr. Newlin today, 11/12/23. He is one of my TNM/diabetes patients. Can we schedule him for follow-up with me in 1 month?

## 2023-11-12 NOTE — Patient Instructions (Signed)
 VISIT SUMMARY:  Today, we addressed your cough, injection site reactions, diabetes management, blood pressure, thyroid  function, gout, and back pain. We made some adjustments to your medications and provided recommendations to help manage your symptoms.  YOUR PLAN:  -UPPER RESPIRATORY TRACT INFECTION WITH POSTNASAL DRIP: You have a mild upper respiratory infection causing a cough due to sinus drainage. We will prescribe a cough syrup to help reduce the cough and dry up secretions. Please monitor for any fever or chills and let us  know if your symptoms worsen.  -TYPE 2 DIABETES MELLITUS WITH HYPERGLYCEMIA: Your blood sugar levels are still higher than we would like. We will increase your insulin  glargine dose to 54 units at night and continue your current medications, Jardiance  and Mounjaro . If the side effects from Mounjaro  continue, we may consider switching you to Ozempic.  -INJECTION SITE REACTION DUE TO MOUNJARO : You are experiencing itching, burning, and swelling at the injection sites of Mounjaro . We will prescribe Emla  cream to apply after injections to help reduce these symptoms. If the reactions continue, we may need to switch you to a different medication.  -HYPERTENSION ASSOCIATED WITH DIABETES: Your blood pressure is still high. We will switch your medication from losartan  to valsartan  to help better control your blood pressure. Please continue your current medications and follow a low sodium diet.  -ACQUIRED HYPOTHYROIDISM: Your thyroid  function needs to be reassessed. We will order repeat thyroid  function tests to check your current TSH levels and adjust your levothyroxine  dose if needed.  -GOUT: Your gout is well-managed with allopurinol , and you have not had any recent flares. We will refill your allopurinol  prescription.  -CHRONIC BACK PAIN: Your back pain is being managed with Flexeril . We will continue this medication as needed and discontinue methocarbamol and gabapentin , as  they are not necessary.  INSTRUCTIONS:  Please follow up with us  if your cough worsens or if you develop a fever. Continue monitoring your blood sugar levels and report any significant changes. We will reassess your thyroid  function with a repeat test and adjust your medication as needed. Make sure to follow a low sodium diet to help manage your blood pressure. Refill your allopurinol  prescription to keep your gout under control. Use Flexeril  as needed for back pain and discontinue methocarbamol and gabapentin .

## 2023-11-13 ENCOUNTER — Other Ambulatory Visit: Payer: Self-pay

## 2023-11-13 ENCOUNTER — Ambulatory Visit: Payer: Self-pay | Admitting: Family Medicine

## 2023-11-13 ENCOUNTER — Other Ambulatory Visit (HOSPITAL_BASED_OUTPATIENT_CLINIC_OR_DEPARTMENT_OTHER): Admitting: Pharmacist

## 2023-11-13 DIAGNOSIS — E039 Hypothyroidism, unspecified: Secondary | ICD-10-CM

## 2023-11-13 DIAGNOSIS — Z79899 Other long term (current) drug therapy: Secondary | ICD-10-CM

## 2023-11-13 LAB — VITAMIN D 25 HYDROXY (VIT D DEFICIENCY, FRACTURES): Vit D, 25-Hydroxy: 21 ng/mL — ABNORMAL LOW (ref 30.0–100.0)

## 2023-11-13 LAB — PSA, TOTAL AND FREE
PSA, Free Pct: 15.9 %
PSA, Free: 0.27 ng/mL
Prostate Specific Ag, Serum: 1.7 ng/mL (ref 0.0–4.0)

## 2023-11-13 LAB — TSH: TSH: 12 u[IU]/mL — ABNORMAL HIGH (ref 0.450–4.500)

## 2023-11-13 LAB — T3: T3, Total: 51 ng/dL — ABNORMAL LOW (ref 71–180)

## 2023-11-13 LAB — T4, FREE: Free T4: 0.58 ng/dL — ABNORMAL LOW (ref 0.82–1.77)

## 2023-11-13 MED ORDER — LEVOTHYROXINE SODIUM 300 MCG PO TABS
300.0000 ug | ORAL_TABLET | Freq: Every day | ORAL | 1 refills | Status: DC
  Filled 2023-11-13: qty 90, 90d supply, fill #0
  Filled 2024-02-12: qty 90, 90d supply, fill #1

## 2023-11-13 MED ORDER — ERGOCALCIFEROL 1.25 MG (50000 UT) PO CAPS
50000.0000 [IU] | ORAL_CAPSULE | ORAL | 1 refills | Status: AC
  Filled 2023-11-13: qty 12, 84d supply, fill #0
  Filled 2024-02-12: qty 12, 84d supply, fill #1

## 2023-11-13 NOTE — Progress Notes (Signed)
 Pharmacy Quality Measure Review  This patient is appearing on a report for being at risk of failing the adherence measure for cholesterol (statin) medications this calendar year.   Medication: atorvastatin  Last fill date: 11/06/2023 for 90 day supply.  Insurance report was not up to date. No action needed at this time.   Of note, I am also assisting him with DM management. He is on Jardiance , Lantus , metformin , and Mounjaro . He is due for refills of the metformin  and insulin . I have coordinated with our pharmacy to fill this.   Herlene Fleeta Morris, PharmD, JAQUELINE, CPP Clinical Pharmacist [facility] [phone]

## 2023-11-18 ENCOUNTER — Other Ambulatory Visit: Payer: Self-pay

## 2023-11-20 ENCOUNTER — Other Ambulatory Visit (HOSPITAL_BASED_OUTPATIENT_CLINIC_OR_DEPARTMENT_OTHER): Payer: Self-pay | Admitting: Pharmacist

## 2023-11-20 ENCOUNTER — Other Ambulatory Visit: Payer: Self-pay

## 2023-11-20 DIAGNOSIS — E119 Type 2 diabetes mellitus without complications: Secondary | ICD-10-CM

## 2023-11-20 NOTE — Progress Notes (Signed)
 Pharmacy Quality Measure Review  This patient is appearing on a report for being at risk of failing the adherence measure for cholesterol (statin) medications this calendar year.   Medication: atorvastatin  Last fill date: 11/06/2023 for 90 day supply.  Insurance report was not up to date. No action needed at this time.   Of note, I am also assisting him with DM management. He is on Jardiance , Lantus , metformin , and Mounjaro . He looks up to date on fills. I collaborated with our pharmacy. Will set reminder for Mounjaro  upcoming on 11/29/23.  Herlene Fleeta Morris, PharmD, JAQUELINE, CPP Clinical Pharmacist [facility] [phone]

## 2023-11-22 ENCOUNTER — Other Ambulatory Visit: Payer: Self-pay

## 2023-11-22 ENCOUNTER — Ambulatory Visit (HOSPITAL_COMMUNITY)
Admission: EM | Admit: 2023-11-22 | Discharge: 2023-11-22 | Disposition: A | Discharge status: Home / Self Care | Attending: Internal Medicine | Admitting: Internal Medicine

## 2023-11-22 ENCOUNTER — Emergency Department (HOSPITAL_COMMUNITY)

## 2023-11-22 ENCOUNTER — Encounter (HOSPITAL_COMMUNITY): Payer: Self-pay

## 2023-11-22 ENCOUNTER — Emergency Department (HOSPITAL_COMMUNITY)
Admission: EM | Admit: 2023-11-22 | Discharge: 2023-11-22 | Disposition: A | Discharge status: Home / Self Care | Source: Ambulatory Visit | Attending: Emergency Medicine | Admitting: Emergency Medicine

## 2023-11-22 DIAGNOSIS — E039 Hypothyroidism, unspecified: Secondary | ICD-10-CM | POA: Diagnosis not present

## 2023-11-22 DIAGNOSIS — R140 Abdominal distension (gaseous): Secondary | ICD-10-CM | POA: Diagnosis not present

## 2023-11-22 DIAGNOSIS — E119 Type 2 diabetes mellitus without complications: Secondary | ICD-10-CM | POA: Diagnosis not present

## 2023-11-22 DIAGNOSIS — I10 Essential (primary) hypertension: Secondary | ICD-10-CM | POA: Insufficient documentation

## 2023-11-22 DIAGNOSIS — H547 Unspecified visual loss: Secondary | ICD-10-CM | POA: Diagnosis not present

## 2023-11-22 DIAGNOSIS — R0781 Pleurodynia: Secondary | ICD-10-CM | POA: Diagnosis not present

## 2023-11-22 DIAGNOSIS — R519 Headache, unspecified: Secondary | ICD-10-CM | POA: Insufficient documentation

## 2023-11-22 DIAGNOSIS — K760 Fatty (change of) liver, not elsewhere classified: Secondary | ICD-10-CM | POA: Diagnosis not present

## 2023-11-22 DIAGNOSIS — S39012A Strain of muscle, fascia and tendon of lower back, initial encounter: Secondary | ICD-10-CM | POA: Diagnosis not present

## 2023-11-22 DIAGNOSIS — G453 Amaurosis fugax: Secondary | ICD-10-CM | POA: Diagnosis not present

## 2023-11-22 DIAGNOSIS — Z7984 Long term (current) use of oral hypoglycemic drugs: Secondary | ICD-10-CM | POA: Insufficient documentation

## 2023-11-22 DIAGNOSIS — R1031 Right lower quadrant pain: Secondary | ICD-10-CM | POA: Diagnosis not present

## 2023-11-22 DIAGNOSIS — Z794 Long term (current) use of insulin: Secondary | ICD-10-CM | POA: Insufficient documentation

## 2023-11-22 DIAGNOSIS — R109 Unspecified abdominal pain: Secondary | ICD-10-CM | POA: Insufficient documentation

## 2023-11-22 DIAGNOSIS — R059 Cough, unspecified: Secondary | ICD-10-CM | POA: Diagnosis not present

## 2023-11-22 DIAGNOSIS — Z79899 Other long term (current) drug therapy: Secondary | ICD-10-CM | POA: Diagnosis not present

## 2023-11-22 DIAGNOSIS — H539 Unspecified visual disturbance: Secondary | ICD-10-CM

## 2023-11-22 DIAGNOSIS — K449 Diaphragmatic hernia without obstruction or gangrene: Secondary | ICD-10-CM | POA: Diagnosis not present

## 2023-11-22 LAB — COMPREHENSIVE METABOLIC PANEL WITH GFR
ALT: 19 U/L (ref 0–44)
AST: 28 U/L (ref 15–41)
Albumin: 4.3 g/dL (ref 3.5–5.0)
Alkaline Phosphatase: 90 U/L (ref 38–126)
Anion gap: 12 (ref 5–15)
BUN: 10 mg/dL (ref 8–23)
CO2: 23 mmol/L (ref 22–32)
Calcium: 9.3 mg/dL (ref 8.9–10.3)
Chloride: 104 mmol/L (ref 98–111)
Creatinine, Ser: 1.02 mg/dL (ref 0.61–1.24)
GFR, Estimated: >60 mL/min (ref >60)
Glucose, Bld: 102 mg/dL — ABNORMAL HIGH (ref 70–99)
Potassium: 4.5 mmol/L (ref 3.5–5.1)
Sodium: 139 mmol/L (ref 135–145)
Total Bilirubin: 0.4 mg/dL (ref 0.0–1.2)
Total Protein: 7.7 g/dL (ref 6.5–8.1)

## 2023-11-22 LAB — CBC WITH DIFFERENTIAL/PLATELET
Abs Immature Granulocytes: 0.02 K/uL (ref 0.00–0.07)
Basophils Absolute: 0 K/uL (ref 0.0–0.1)
Basophils Relative: 0 %
Eosinophils Absolute: 0.2 K/uL (ref 0.0–0.5)
Eosinophils Relative: 2 %
HCT: 49.9 % (ref 39.0–52.0)
Hemoglobin: 15.2 g/dL (ref 13.0–17.0)
Immature Granulocytes: 0 %
Lymphocytes Relative: 30 %
Lymphs Abs: 2.8 K/uL (ref 0.7–4.0)
MCH: 27.2 pg (ref 26.0–34.0)
MCHC: 30.5 g/dL (ref 30.0–36.0)
MCV: 89.3 fL (ref 80.0–100.0)
Monocytes Absolute: 0.4 K/uL (ref 0.1–1.0)
Monocytes Relative: 4 %
Neutro Abs: 6 K/uL (ref 1.7–7.7)
Neutrophils Relative %: 64 %
Platelets: 278 K/uL (ref 150–400)
RBC: 5.59 MIL/uL (ref 4.22–5.81)
RDW: 14.1 % (ref 11.5–15.5)
WBC: 9.4 K/uL (ref 4.0–10.5)
nRBC: 0 % (ref 0.0–0.2)

## 2023-11-22 LAB — LIPASE, BLOOD: Lipase: 104 U/L — ABNORMAL HIGH (ref 11–51)

## 2023-11-22 MED ORDER — BACLOFEN 10 MG PO TABS
10.0000 mg | ORAL_TABLET | Freq: Three times a day (TID) | ORAL | 0 refills | Status: AC | PRN
  Filled 2023-11-22: qty 30, 5d supply, fill #0

## 2023-11-22 MED ORDER — IOHEXOL 350 MG/ML SOLN
100.0000 mL | Freq: Once | INTRAVENOUS | Status: AC | PRN
  Administered 2023-11-22: 100 mL via INTRAVENOUS

## 2023-11-22 MED ORDER — KETOROLAC TROMETHAMINE 15 MG/ML IJ SOLN
15.0000 mg | Freq: Once | INTRAMUSCULAR | Status: AC
  Administered 2023-11-22: 15 mg via INTRAVENOUS
  Filled 2023-11-22: qty 1

## 2023-11-22 NOTE — Discharge Instructions (Addendum)
 You were evaluated in the emergency room for flank pain.  Your lab work and imaging did not show any significant abnormality.  You were provided a referral for neurology.  You should be expecting a call within the next few days to set up an appointment.  If experience any new or worsening symptoms please return emergency room.

## 2023-11-22 NOTE — ED Provider Notes (Signed)
 [facility]    CSN: [id number] Arrival date & time: 11/22/23  0915      History   Chief Complaint Chief Complaint  Patient presents with   Flank Pain    HPI Ray Garner is a 68 y.o. male who presents with onset of R flank pain which started last week when he was having a coughing fit. This pain has continued, but today noticed R abdomen swelling and pain. He usually voids 3  times in the middle of the night and did not at all til 5 am today. Denies dysuria or fever. His appetite is low. Denies fever, constipation or diarrhea.  2- his L ear has been hurting since URI and his PCP saw him last week and had no findings.  3- has had near passing out spells when he was coughing a lot last week, but never fainted.      Past Medical History:  Diagnosis Date   Anemia    Arthritis    Back pain, lumbosacral 06/30/2013   Blood transfusion without reported diagnosis    Cataract    BEGINNING   DM type 2 (diabetes mellitus, type 2) (HCC) 06/30/2013   Gout    HTN (hypertension) 06/30/2013   Hypertension    Hypothyroid 06/30/2013    Patient Active Problem List   Diagnosis Date Noted   Healthcare maintenance 07/08/2017   B12 deficiency 07/08/2017   Carpal tunnel syndrome 08/01/2015   DM type 2 (diabetes mellitus, type 2) (HCC) 06/30/2013   HTN (hypertension) 06/30/2013   Gout 06/30/2013   Back pain, lumbosacral 06/30/2013   Testicular cyst s/p surgery 06/30/2013   Hypothyroidism 06/30/2013    Past Surgical History:  Procedure Laterality Date   CYST EXCISION     FRACTURE SURGERY         Home Medications    Prior to Admission medications   Medication Sig Start Date End Date Taking? Authorizing Provider  allopurinol  (ZYLOPRIM ) 300 MG tablet Take 1 tablet (300 mg total) by mouth daily. 11/12/23  Yes Newlin, Enobong, MD  amLODipine  (NORVASC ) 10 MG tablet Take 1 tablet (10 mg total) by mouth daily. 08/12/23  Yes Newlin, Enobong, MD  atorvastatin  (LIPITOR) 40 MG tablet Take  1 tablet (40 mg total) by mouth daily. 08/12/23  Yes Newlin, Enobong, MD  baclofen  (LIORESAL ) 10 MG tablet Take 1-2 tablets (10-20 mg total) by mouth 3 (three) times daily as needed muscle spasm. 11/22/23  Yes Rodriguez-Southworth, Elicia Lui, PA-C  carvedilol  (COREG ) 6.25 MG tablet TAKE 1 TABLET (6.25 MG TOTAL) BY MOUTH 2 (TWO) TIMES DAILY WITH A MEAL. 10/12/21 11/22/23 Yes McClung, Jon HERO, PA-C  Continuous Blood Gluc Receiver (FREESTYLE LIBRE 14 DAY READER) DEVI 1 each by Does not apply route 3 (three) times daily with meals. 07/11/17  Yes Newlin, Enobong, MD  Continuous Blood Gluc Sensor (FREESTYLE LIBRE 14 DAY SENSOR) MISC 1 each by Does not apply route 3 (three) times daily with meals. 01/29/20  Yes Newlin, Enobong, MD  empagliflozin  (JARDIANCE ) 25 MG TABS tablet Take 1 tablet (25 mg total) by mouth daily before breakfast. 08/12/23  Yes Newlin, Enobong, MD  ergocalciferol  (DRISDOL ) 1.25 MG (50000 UT) capsule Take 1 capsule (50,000 Units total) by mouth once a week. 11/13/23  Yes Newlin, Enobong, MD  glucose blood (BAYER CONTOUR TEST) test strip Use as instructed 07/08/17  Yes Berneta Elsie Sayre, MD  hydrocortisone  1 % ointment Apply 1 application topically 2 (two) times daily. 11/12/18  Yes Newlin, Enobong, MD  insulin   glargine (LANTUS  SOLOSTAR) 100 UNIT/ML Solostar Pen Inject 54 Units into the skin daily. 11/12/23  Yes Newlin, Enobong, MD  Insulin  Pen Needle (TRUEPLUS PEN NEEDLES) 31G X 6 MM MISC USE AS DIRECTED. 03/05/23  Yes Newlin, Enobong, MD  levothyroxine  (SYNTHROID ) 300 MCG tablet Take 1 tablet (300 mcg total) by mouth daily before breakfast. 11/13/23  Yes Newlin, Enobong, MD  lidocaine -prilocaine  (EMLA ) cream Apply 1 Application topically as needed. 11/12/23  Yes Newlin, Enobong, MD  metFORMIN  (GLUCOPHAGE ) 500 MG tablet Take 2 tablets (1,000 mg total) by mouth 2 (two) times daily with a meal. 08/12/23  Yes Newlin, Enobong, MD  metoCLOPramide  (REGLAN ) 5 MG tablet Take 1 tablet (5 mg total) by mouth 3  (three) times daily before meals. 08/12/23  Yes Newlin, Enobong, MD  omeprazole  (PRILOSEC) 20 MG capsule Take 1 capsule (20 mg total) by mouth daily. 08/12/23  Yes Newlin, Enobong, MD  promethazine -dextromethorphan (PROMETHAZINE -DM) 6.25-15 MG/5ML syrup Take 5 mLs by mouth 4 (four) times daily as needed for cough. 11/12/23  Yes Newlin, Enobong, MD  sildenafil  (VIAGRA ) 100 MG tablet Take 1 tablet (100 mg total) by mouth daily as needed for erectile dysfunction. 03/04/23  Yes Newlin, Enobong, MD  tirzepatide  (MOUNJARO ) 15 MG/0.5ML Pen Inject 15 mg into the skin once a week. 09/12/23  Yes Newlin, Enobong, MD  valsartan  (DIOVAN ) 160 MG tablet Take 1 tablet (160 mg total) by mouth daily. 11/12/23  Yes Newlin, Enobong, MD  vitamin B-12 (CYANOCOBALAMIN) 1000 MCG tablet Take 1,000 mcg by mouth daily.   Yes [provider]    Family History Family History  Problem Relation Age of Onset   Breast cancer Niece    Colon cancer Neg Hx    Colon polyps Neg Hx    Esophageal cancer Neg Hx    Rectal cancer Neg Hx    Stomach cancer Neg Hx     Social History Social History   Tobacco Use   Smoking status: Former    Current packs/day: 0.00    Types: Cigarettes    Quit date: 05/14/1983    Years since quitting: 40.5   Smokeless tobacco: Former  Building services engineer status: Former  Substance Use Topics   Alcohol use: Yes    Comment: OCC.   Drug use: No     Allergies   Vicodin [hydrocodone -acetaminophen ]   Review of Systems Review of Systems  As noted in HPI  Physical Exam Triage Vital Signs ED Triage Vitals  Encounter Vitals Group     BP 11/22/23 0946 (!) 143/92     Girls Systolic BP Percentile --      Girls Diastolic BP Percentile --      Boys Systolic BP Percentile --      Boys Diastolic BP Percentile --      Pulse Rate 11/22/23 0946 77     Resp 11/22/23 0946 18     Temp 11/22/23 0946 98.4 F (36.9 C)     Temp Source 11/22/23 0946 Oral     SpO2 11/22/23 0946 98 %     Weight --       Height --      Head Circumference --      Peak Flow --      Pain Score 11/22/23 0941 9     Pain Loc --      Pain Education --      Exclude from Growth Chart --    No data found.  Updated Vital Signs BP ROLLEN)  143/92 (BP Location: Left Arm)   Pulse 77   Temp 98.4 F (36.9 C) (Oral)   Resp 18   SpO2 98%   Visual Acuity Right Eye Distance:   Left Eye Distance:   Bilateral Distance:    Right Eye Near:   Left Eye Near:    Bilateral Near:     Physical Exam Vitals and nursing note reviewed.  Constitutional:      General: He is not in acute distress.    Appearance: He is obese. He is not toxic-appearing.  HENT:     Right Ear: Tympanic membrane, ear canal and external ear normal.     Left Ear: Tympanic membrane, ear canal and external ear normal.  Eyes:     General: No scleral icterus.    Conjunctiva/sclera: Conjunctivae normal.  Pulmonary:     Effort: Pulmonary effort is normal.  Abdominal:     General: Abdomen is protuberant. Bowel sounds are decreased. There is distension.     Tenderness: There is abdominal tenderness in the right lower quadrant. There is guarding and rebound. There is no right CVA tenderness or left CVA tenderness.      Comments: Has swelling of R mid abdomen compared to the L. R mid abdomen is tender with mild rebound. RLQ is more tender with guarding and rebound.    Musculoskeletal:        General: Normal range of motion.     Cervical back: Neck supple.     Comments: BACK- has local tenderness on R mid lumbar region and the border of his R lower rib with palpation, and L lateral flexion.   Skin:    General: Skin is warm and dry.     Findings: No rash.  Neurological:     Mental Status: He is alert and oriented to person, place, and time.     Gait: Gait normal.  Psychiatric:        Mood and Affect: Mood normal.        Behavior: Behavior normal.        Thought Content: Thought content normal.        Judgment: Judgment normal.      UC  Treatments / Results  Labs (all labs ordered are listed, but only abnormal results are displayed) Labs Reviewed - No data to display   EKG   Radiology No results found.  Procedures Procedures (including critical care time)  Medications Ordered in UC Medications - No data to display  Initial Impression / Assessment and Plan / UC Course  I have reviewed the triage vital signs and the nursing notes.  R lumbar strain from coughing. I believe the near faint is from hyperventilating when he had the coughing fits last week. Acute R mid and lower quadrant pain. He was sent to ER for further work up.     Final Clinical Impressions(s) / UC Diagnoses   Final diagnoses:  Strain of fascia of lower back  Acute right lower quadrant pain  Abdominal distension     Discharge Instructions      Please go to the ER to have more tests done which we can't do here.   For your back strain I sent a muscle relaxer.      ED Prescriptions     Medication Sig Dispense Auth. Provider   baclofen  (LIORESAL ) 10 MG tablet Take 1-2 tablets (10-20 mg total) by mouth 3 (three) times daily as needed muscle spasm. 30 each Rodriguez-Southworth, Kyra, PA-C  PDMP not reviewed this encounter.   Lindi Carter, PA-C 11/22/23 1030

## 2023-11-22 NOTE — ED Notes (Signed)
 Patient is being discharged from the Urgent Care and sent to the Emergency Department via personal opperated vehicle . Per Rodriguez-Southworth, Kyra, PA-C, patient is in need of higher level of care due to Flank pain and urinary retention. Patient is aware and verbalizes understanding of plan of care.  Vitals:   11/22/23 0946  BP: (!) 143/92  Pulse: 77  Resp: 18  Temp: 98.4 F (36.9 C)  SpO2: 98%

## 2023-11-22 NOTE — ED Provider Notes (Signed)
 [facility]PARTMENT AT [facility] Provider Note   CSN: [id number] Arrival date & time: 11/22/23  1042     Patient presents with: No chief complaint on file.   Ray Garner is a 68 y.o. male history of hypertension, hypothyroidism presents with complaints of right flank and abdominal pain.  Reports that he has been having this pain for the past week.  Notes that the prior week he had a URI and was coughing frequently.  There is no injury or trauma to the flank.  He has had no nausea, vomiting or diarrhea.  No urinary symptoms.  Pain is constant, worse with certain movements, coughing and after eating.  No prior abdominal surgeries.  Last bowel movement was today.  Was evaluated urgent care.  Referred here to obtain a CT scan to rule out appendicitis.  Patient also reports that over the past couple months he has had approximately 3 episodes of bilateral vision loss, lasting approximately 2 to 3 minutes each time.  He has been having some new headaches recently as well as blurry vision intermittently.  He has no associated difficulty speaking or ambulating.  No extremity weakness or numbness.   HPI Past Medical History:  Diagnosis Date   Anemia    Arthritis    Back pain, lumbosacral 06/30/2013   Blood transfusion without reported diagnosis    Cataract    BEGINNING   DM type 2 (diabetes mellitus, type 2) (HCC) 06/30/2013   Gout    HTN (hypertension) 06/30/2013   Hypertension    Hypothyroid 06/30/2013   Past Surgical History:  Procedure Laterality Date   CYST EXCISION     FRACTURE SURGERY         Prior to Admission medications   Medication Sig Start Date End Date Taking? Authorizing Provider  allopurinol  (ZYLOPRIM ) 300 MG tablet Take 1 tablet (300 mg total) by mouth daily. 11/12/23   Newlin, Enobong, MD  amLODipine  (NORVASC ) 10 MG tablet Take 1 tablet (10 mg total) by mouth daily. 08/12/23   Newlin, Enobong, MD  atorvastatin  (LIPITOR) 40 MG tablet Take 1 tablet (40 mg  total) by mouth daily. 08/12/23   Newlin, Enobong, MD  baclofen  (LIORESAL ) 10 MG tablet Take 1-2 tablets (10-20 mg total) by mouth 3 (three) times daily as needed muscle spasm. 11/22/23   Rodriguez-Southworth, Sylvia, PA-C  carvedilol  (COREG ) 6.25 MG tablet TAKE 1 TABLET (6.25 MG TOTAL) BY MOUTH 2 (TWO) TIMES DAILY WITH A MEAL. 10/12/21 11/22/23  Danton Jon HERO, PA-C  Continuous Blood Gluc Receiver (FREESTYLE LIBRE 14 DAY READER) DEVI 1 each by Does not apply route 3 (three) times daily with meals. 07/11/17   Newlin, Enobong, MD  Continuous Blood Gluc Sensor (FREESTYLE LIBRE 14 DAY SENSOR) MISC 1 each by Does not apply route 3 (three) times daily with meals. 01/29/20   Newlin, Enobong, MD  empagliflozin  (JARDIANCE ) 25 MG TABS tablet Take 1 tablet (25 mg total) by mouth daily before breakfast. 08/12/23   Delbert Clam, MD  ergocalciferol  (DRISDOL ) 1.25 MG (50000 UT) capsule Take 1 capsule (50,000 Units total) by mouth once a week. 11/13/23   Newlin, Enobong, MD  glucose blood (BAYER CONTOUR TEST) test strip Use as instructed 07/08/17   Berneta Elsie Sayre, MD  hydrocortisone  1 % ointment Apply 1 application topically 2 (two) times daily. 11/12/18   Newlin, Enobong, MD  insulin  glargine (LANTUS  SOLOSTAR) 100 UNIT/ML Solostar Pen Inject 54 Units into the skin daily. 11/12/23   Newlin, Enobong, MD  Insulin  Pen Needle (TRUEPLUS PEN NEEDLES) 31G X 6 MM MISC USE AS DIRECTED. 03/05/23   Newlin, Enobong, MD  levothyroxine  (SYNTHROID ) 300 MCG tablet Take 1 tablet (300 mcg total) by mouth daily before breakfast. 11/13/23   Newlin, Enobong, MD  lidocaine -prilocaine  (EMLA ) cream Apply 1 Application topically as needed. 11/12/23   Newlin, Enobong, MD  metFORMIN  (GLUCOPHAGE ) 500 MG tablet Take 2 tablets (1,000 mg total) by mouth 2 (two) times daily with a meal. 08/12/23   Delbert Clam, MD  metoCLOPramide  (REGLAN ) 5 MG tablet Take 1 tablet (5 mg total) by mouth 3 (three) times daily before meals. 08/12/23   Newlin, Enobong,  MD  omeprazole  (PRILOSEC) 20 MG capsule Take 1 capsule (20 mg total) by mouth daily. 08/12/23   Newlin, Enobong, MD  promethazine -dextromethorphan (PROMETHAZINE -DM) 6.25-15 MG/5ML syrup Take 5 mLs by mouth 4 (four) times daily as needed for cough. 11/12/23   Newlin, Enobong, MD  sildenafil  (VIAGRA ) 100 MG tablet Take 1 tablet (100 mg total) by mouth daily as needed for erectile dysfunction. 03/04/23   Newlin, Enobong, MD  tirzepatide  (MOUNJARO ) 15 MG/0.5ML Pen Inject 15 mg into the skin once a week. 09/12/23   Newlin, Enobong, MD  valsartan  (DIOVAN ) 160 MG tablet Take 1 tablet (160 mg total) by mouth daily. 11/12/23   Newlin, Enobong, MD  vitamin B-12 (CYANOCOBALAMIN) 1000 MCG tablet Take 1,000 mcg by mouth daily.    [provider]    Allergies: Vicodin [hydrocodone -acetaminophen ]    Review of Systems  Gastrointestinal:  Positive for abdominal pain.    Updated Vital Signs BP (!) 150/104   Pulse 82   Temp 98.7 F (37.1 C) (Oral)   Resp 16   SpO2 97%   Physical Exam Vitals and nursing note reviewed.  Constitutional:      General: He is not in acute distress.    Appearance: He is well-developed.  HENT:     Head: Normocephalic and atraumatic.  Eyes:     Conjunctiva/sclera: Conjunctivae normal.  Cardiovascular:     Rate and Rhythm: Normal rate and regular rhythm.     Heart sounds: No murmur heard. Pulmonary:     Effort: Pulmonary effort is normal. No respiratory distress.     Breath sounds: Normal breath sounds.  Abdominal:     Palpations: Abdomen is soft.     Tenderness: There is abdominal tenderness.     Comments: Tenderness to right lower rib, negative CVAT, tenderness to right upper, right lower and epigastric regions, abdomen soft nondistended  Musculoskeletal:        General: No swelling.     Cervical back: Neck supple.  Skin:    General: Skin is warm and dry.     Capillary Refill: Capillary refill takes less than 2 seconds.  Neurological:     Mental Status: He  is alert.  Psychiatric:        Mood and Affect: Mood normal.     (all labs ordered are listed, but only abnormal results are displayed) Labs Reviewed  COMPREHENSIVE METABOLIC PANEL WITH GFR - Abnormal; Notable for the following components:      Result Value   Glucose, Bld 102 (*)    All other components within normal limits  LIPASE, BLOOD - Abnormal; Notable for the following components:   Lipase 104 (*)    All other components within normal limits  CBC WITH DIFFERENTIAL/PLATELET  URINALYSIS, ROUTINE W REFLEX MICROSCOPIC    EKG: None  Radiology: CT ANGIO HEAD NECK W WO  CM Result Date: 11/22/2023 CLINICAL DATA:  Provided history: Vision loss, binocular. EXAM: CT ANGIOGRAPHY HEAD AND NECK WITH AND WITHOUT CONTRAST TECHNIQUE: Multidetector CT imaging of the head and neck was performed using the standard protocol during bolus administration of intravenous contrast. Multiplanar CT image reconstructions and MIPs were obtained to evaluate the vascular anatomy. Carotid stenosis measurements (when applicable) are obtained utilizing NASCET criteria, using the distal internal carotid diameter as the denominator. RADIATION DOSE REDUCTION: This exam was performed according to the departmental dose-optimization program which includes automated exposure control, adjustment of the mA and/or kV according to patient size and/or use of iterative reconstruction technique. CONTRAST:  OMNIPAQUE  IOHEXOL  350 MG/ML SOLN COMPARISON:  Brain MRI 12/06/2010. FINDINGS: CT HEAD FINDINGS Brain: Cerebral volume is normal. There is no acute intracranial hemorrhage. No demarcated cortical infarct. No extra-axial fluid collection. No evidence of an intracranial mass. No midline shift. Vascular: No hyperdense vessel. Otherwise separately reported below. Skull: No calvarial fracture or aggressive osseous lesion. Sinuses/Orbits: No orbital mass or acute orbital finding. No significant paranasal sinus disease. Review of the  MIP images confirms the above findings CTA NECK FINDINGS Aortic arch: Standard aortic branching. Streak/beam hardening artifact arising from a dense contrast bolus partially obscures the right subclavian artery. Within this limitation, there is no appreciable hemodynamically significant innominate or proximal subclavian artery stenosis. Right carotid system: CCA and ICA patent within the neck without stenosis or significant atherosclerotic disease. Left carotid system: CCA and ICA patent within the neck without stenosis or significant atherosclerotic disease. Vertebral arteries: Patent within the neck without stenosis or significant atherosclerotic disease. Skeleton: The patient is edentulous. Nonspecific reversal of the expected cervical lordosis. Cervical spondylosis. No acute fracture or aggressive osseous lesion. Other neck: 9 mm nodule within the superficial lobe of the right parotid gland. No pathologically enlarged lymph nodes identified within the neck. Upper chest: No consolidation within the imaged lung apices. Review of the MIP images confirms the above findings CTA HEAD FINDINGS Anterior circulation: The intracranial internal carotid arteries are patent. Non-stenotic calcified plaque within both vessels. The M1 middle cerebral arteries are patent. No M2 proximal branch occlusion or high-grade proximal stenosis. The anterior cerebral arteries are patent. Hypoplastic right A1 segment. No intracranial aneurysm is identified. Posterior circulation: The intracranial vertebral arteries are patent. The basilar artery is patent. The posterior cerebral arteries are patent. Posterior communicating arteries are diminutive or absent, bilaterally. Venous sinuses: Within the limitations of contrast timing, no convincing thrombus. Anatomic variants: As described. Review of the MIP images confirms the above findings IMPRESSION: Non-contrast head CT No evidence of an acute intracranial abnormality. CTA neck: 1. The  common carotid, internal carotid and vertebral arteries are patent within the neck without stenosis or significant atherosclerotic disease. 2. 9 mm right parotid gland nodule. This could reflect a nonspecific mildly enlarged intraparotid lymph node. However, a small primary parotid neoplasm cannot be excluded. CTA head: No proximal intracranial large vessel occlusion or high-grade proximal arterial stenosis identified. Electronically Signed   By: Rockey Childs D.O.   On: 11/22/2023 14:21   CT ABDOMEN PELVIS W CONTRAST Result Date: 11/22/2023 CLINICAL DATA:  RLQ abdominal pain. EXAM: CT ABDOMEN AND PELVIS WITH CONTRAST TECHNIQUE: Multidetector CT imaging of the abdomen and pelvis was performed using the standard protocol following bolus administration of intravenous contrast. RADIATION DOSE REDUCTION: This exam was performed according to the departmental dose-optimization program which includes automated exposure control, adjustment of the mA and/or kV according to patient size and/or use of  iterative reconstruction technique. CONTRAST:  OMNIPAQUE  IOHEXOL  350 MG/ML SOLN COMPARISON:  CT scan abdomen pelvis 09/11/2020. FINDINGS: Lower chest: The lung bases are clear. No pleural effusion. The heart is normal in size. No pericardial effusion. Hepatobiliary: The liver is normal in size. There is subtle liver surface nodularity. Correlate for cirrhosis. No focal liver lesion. There is mild diffuse hepatic steatosis. No intrahepatic or extrahepatic bile duct dilation. No calcified gallstones. Normal gallbladder wall thickness. No pericholecystic inflammatory changes. Pancreas: Unremarkable. No pancreatic ductal dilatation or surrounding inflammatory changes. Spleen: Within normal limits. No focal lesion. Adrenals/Urinary Tract: Stable nodularity of the left adrenal gland. Unremarkable right adrenal gland. No suspicious renal mass. There is a 1.1 x 1.4 cm cyst arising from the right kidney upper pole. No  nephroureterolithiasis or obstructive uropathy. Unremarkable urinary bladder. Stomach/Bowel: There is a small sliding hiatal hernia. No disproportionate dilation of the small or large bowel loops. No evidence of abnormal bowel wall thickening or inflammatory changes. The appendix is unremarkable. Vascular/Lymphatic: No ascites or pneumoperitoneum. No abdominal or pelvic lymphadenopathy, by size criteria. No aneurysmal dilation of the major abdominal arteries. Reproductive: Normal size prostate. Symmetric seminal vesicles. Other: There are fat containing umbilical and bilateral inguinal hernias. There is mild anasarca. Musculoskeletal: No suspicious osseous lesions. There are mild multilevel degenerative changes in the visualized spine. IMPRESSION: 1. No acute inflammatory process identified within the abdomen or pelvis. Unremarkable appendix. 2. Multiple other nonacute observations, as described above. Electronically Signed   By: Ree Molt M.D.   On: 11/22/2023 13:55   DG Ribs Unilateral W/Chest Right Result Date: 11/22/2023 EXAM: 1 AP VIEW XRAY OF THE RIGHT RIBS AND CHEST 11/22/2023 01:04:00 PM COMPARISON: 04/03/2018 CLINICAL HISTORY: pain. Right side flank pain and swelling. States also has had spells of blacking out and ear pain. States was unable to get in to see his primary. Ongoing since having a cold last week. Has been sleeping with a massaging heated pad with no relief. Pain with ; movement, tough, deep breaths, and continuous dull pain with rest. FINDINGS: BONES: Remote deformity of right anterior 10th rib. LUNGS AND PLEURA: No consolidation or pulmonary edema. No pleural effusion or pneumothorax. HEART AND MEDIASTINUM: No acute abnormality of the cardiac and mediastinal silhouettes. IMPRESSION: 1. No acute cardiopulmonary abnormality 2. Remote deformity of the right anterior 10th rib. Electronically signed by: Waddell Calk MD 11/22/2023 01:46 PM EDT RP Workstation: [id number]     Procedures    Medications Ordered in the ED  iohexol  (OMNIPAQUE ) 350 MG/ML injection 100 mL (100 mLs Intravenous Contrast Given 11/22/23 1304)    Clinical Course as of 11/22/23 1519  Fri Nov 22, 2023  1137 Patient evaluated for multiple complaints including right flank and abdominal pain over the past week that is not associate with any nausea, vomiting or diarrhea.  Additionally evaluated for episodes of amaurosis fugax bilaterally over the past few months that is associated with new headaches and blurry vision.  Upon arrival he is hypertensive despite taking his blood pressure medications today.  He is otherwise hemodynamically stable.  On exam he does have tenderness to the right lower and upper quadrants as well as to the right flank.  He has no neurodeficits on exam.  Will obtain routine labs and CT abdomen pelvis as well as angio head and neck and chest x-ray. [JT]  1229 CBC with Differential Unremarkable [JT]  1245 Lipase, blood(!) Mildly elevated [JT]  1245 Comprehensive metabolic panel(!) No significant abnormality [JT]  1417 CT ABDOMEN  PELVIS W CONTRAST No acute process [JT]  1424 Arteries are patient within the neck and head without stenosis or significant atherosclerotic disease.  9 mm right parotid gland nodule, represent nonspecific intraparotid lymph node or small primary parotid neoplasm. [JT]  1427 Workup overall reassuring.  Flank pain is likely musculoskeletal.  There is evidence of remote rib injury.  Reports he fractured his ribs when he was younger.  UA is pending, however, has no urinary symptoms.  No acute intra-abdominal pathology based off labs and imaging however.  His CTA is reassuring as well.  Without any current or persistent symptoms no urgent neurology consult was indicated today.  Will provide outpatient neurology follow-up.  Discussed patient with attending, Dr. Doretha.  She is in agreement with plan.  Patient is understanding agreement with plan as well.  Will be discharged  home. [JT]    Clinical Course User Index [JT] Donnajean Lynwood DEL, PA-C                                 Medical Decision Making Amount and/or Complexity of Data Reviewed Labs: ordered. Decision-making details documented in ED Course. Radiology: ordered. Decision-making details documented in ED Course.  Risk Prescription drug management.   This patient presents to the ED with chief complaint(s) of abdominal and flank pain.  The complaint involves an extensive differential diagnosis and also carries with it a high risk of complications and morbidity.   Pertinent past medical history as listed in HPI  The differential diagnosis includes  Considered CVA or TIA.  CT head and neck is clear.  With no new or persistent symptoms do not suspect acute central pathology at this time.  No evidence of acute intra-abdominal pathology base of CT and labs.  Additional history obtained: Records reviewed Care Everywhere/External Records  Disposition:   Patient will be discharged home. The patient has been appropriately medically screened and/or stabilized in the ED. I have low suspicion for any other emergent medical condition which would require further screening, evaluation or treatment in the ED or require inpatient management. At time of discharge the patient is hemodynamically stable and in no acute distress. I have discussed work-up results and diagnosis with patient and answered all questions. Patient is agreeable with discharge plan. We discussed strict return precautions for returning to the emergency department and they verbalized understanding.     Social Determinants of Health:   none  This note was dictated with voice recognition software.  Despite best efforts at proofreading, errors may have occurred which can change the documentation meaning.       Final diagnoses:  Flank pain  Vision changes  Amaurosis fugax, both eyes    ED Discharge Orders          Ordered    Ambulatory  referral to Neurology       Comments: An appointment is requested in approximately: 1 week   11/22/23 1432               Donnajean Lynwood DEL, PA-C 11/22/23 1519    Doretha Folks, MD 11/29/23 [phone]

## 2023-11-22 NOTE — ED Notes (Signed)
 Pt gave a urine sample and sent it to the lab

## 2023-11-22 NOTE — ED Triage Notes (Signed)
 Right side flank pain and swelling. States also has had spells of blacking out and ear pain. States was unable to get in to see his primary. Ongoing since having a cold last week. Has been sleeping with a massaging heated pad with no relief. Pain with movement, tough,, deep breaths, and continuous dull pain with rest.    Having oliguria despite drinking fluids, denies any urinary discomfort. No known falls or injuries. No known sick exposure.

## 2023-11-22 NOTE — ED Triage Notes (Signed)
 Went to UC and sent here for eval - sharp pain in right flank area. Swelling to right side of abdomen. Concern for some constipation. Denies N/V, fevers.

## 2023-11-22 NOTE — Discharge Instructions (Signed)
 Please go to the ER to have more tests done which we can't do here.   For your back strain I sent a muscle relaxer.

## 2023-11-29 ENCOUNTER — Other Ambulatory Visit: Payer: Self-pay

## 2023-11-29 ENCOUNTER — Other Ambulatory Visit (HOSPITAL_COMMUNITY): Payer: Self-pay

## 2023-11-29 ENCOUNTER — Other Ambulatory Visit: Payer: Self-pay | Admitting: Pharmacist

## 2023-11-29 NOTE — Progress Notes (Signed)
 Pharmacy Quality Measure Review  This patient is appearing on a report for being at risk of failing the adherence measure for cholesterol (statin) medications this calendar year.   Medication: atorvastatin  Last fill date: 11/06/2023 for 90 day supply.  Insurance report was not up to date. No action needed at this time.   Of note, I am also assisting him with DM management. He is on Jardiance , Lantus , metformin , and Mounjaro . He looks up to date on fills. I collaborated with our pharmacy. They are refilling his Mounjaro  for him. I called the patient and informed him of this.   Herlene Fleeta Morris, PharmD, JAQUELINE, CPP Clinical Pharmacist [facility] & [facility] [phone]

## 2023-12-06 ENCOUNTER — Other Ambulatory Visit: Payer: Self-pay

## 2023-12-10 ENCOUNTER — Other Ambulatory Visit: Payer: Self-pay

## 2023-12-11 ENCOUNTER — Other Ambulatory Visit: Payer: Self-pay

## 2023-12-11 ENCOUNTER — Other Ambulatory Visit: Payer: Self-pay | Admitting: Pharmacist

## 2023-12-11 NOTE — Progress Notes (Signed)
 Pharmacy Quality Measure Review  This patient is appearing on a report for being at risk of failing the adherence measure for cholesterol (statin) medications this calendar year.   Medication: atorvastatin  Last fill date: 11/06/2023 for 90 day supply.  Insurance report was not up to date. No action needed at this time.   Of note, I am also assisting him with DM management. He is on Jardiance , Lantus , metformin , and Mounjaro . He looks up to date on fills. I collaborated with our pharmacy. They are refilling his Mounjaro  and insulin  for him. I called the patient and informed him of this. He will try to come by and pick-up tomorrow morning.   Herlene Fleeta Morris, PharmD, JAQUELINE, CPP Clinical Pharmacist [facility] & [facility] [phone]

## 2023-12-12 ENCOUNTER — Other Ambulatory Visit: Payer: Self-pay

## 2023-12-17 ENCOUNTER — Ambulatory Visit: Attending: Family Medicine | Admitting: Pharmacist

## 2023-12-17 ENCOUNTER — Encounter: Payer: Self-pay | Admitting: Pharmacist

## 2023-12-17 DIAGNOSIS — Z794 Long term (current) use of insulin: Secondary | ICD-10-CM

## 2023-12-17 DIAGNOSIS — Z7984 Long term (current) use of oral hypoglycemic drugs: Secondary | ICD-10-CM

## 2023-12-17 DIAGNOSIS — E1165 Type 2 diabetes mellitus with hyperglycemia: Secondary | ICD-10-CM | POA: Diagnosis not present

## 2023-12-17 DIAGNOSIS — Z7985 Long-term (current) use of injectable non-insulin antidiabetic drugs: Secondary | ICD-10-CM

## 2023-12-17 NOTE — Progress Notes (Signed)
    S:    PCP: Dr. Delbert PMH: T2DM, HTN, hypothyroidism, gout  Patient arrives in good spirits. Presents for diabetes evaluation, education, and management. Patient was referred and last seen by PCP on 11/12/23. A1c at that visit was down to 8.2% from 8.7%. Was endorsing pain and irritation at site of Mounjaro  injection but elected to continue given the benefit.   Today, he reports in good spirits. He tells me today he is tolerating the Mounjaro . Denies any NV, epigastric pain. No changes in vision. He is adherent to this, metformin , Jardiance , and daily insulin .  Family/Social History:  -Fhx: no pertinent positives -Tobacco use: Former smoker (quit 1985)  Insurance coverage/medication affordability: [employer]  Medication adherence reported. Fill history looks up to date.  Current diabetes medications include: Basaglar  50 units daily (filled 12/11/23 - 55 day supply), Mounjaro  15mg  weekly (filled 12/11/2023 for an 84 day supply), metformin  1000 mg BID (filled 11/18/2023 - 90 day), Jardiance  25 mg daily (filled 11/06/2023 - 90 day)  Patient denies hypoglycemic events.   Patient reported dietary habits:  Breakfast: green beans, ham biscuit  Lunch: green beans, roasted chicken, limits fast food, steak every now and then, fish, [place] Dinner: limits starches Snacks: ritz crackers with cheese Drinks: water, cranberry juice, limits sodas, Gatorade every now and then  Patient-reported exercise habits: 20-30 minutes every other day   Patient denies nocturia (nighttime urination).  Patient denies neuropathy (nerve pain). Patient denies visual changes. Patient reports self foot exams.    O:  No CGM or GM data. Gives range of low 100s - 180s.    Lab Results  Component Value Date   HGBA1C 8.2 (A) 11/12/2023   There were no vitals filed for this visit.  Lipid Panel     Component Value Date/Time   CHOL 128 08/13/2023 0918   TRIG 153 (H) 08/13/2023 0918   HDL 35 (L) 08/13/2023 0918    CHOLHDL 3.4 12/16/2019 1140   CHOLHDL 3.6 08/08/2015 0845   VLDL 34 (H) 08/08/2015 0845   LDLCALC 66 08/13/2023 0918    Clinical Atherosclerotic Cardiovascular Disease (ASCVD): No  The ASCVD Risk score (Arnett DK, et al., 2019) failed to calculate for the following reasons:   The valid total cholesterol range is 130 to 320 mg/dL    A/P: Diabetes longstanding currently uncontrolled with A1C, however, sugars at home are okay. He runs in the 150s mostly in the morning. If we can target a lower range of 80 - 130, we should have a good chance for continued A1c improvement. He is not symptomatic at this time. He is tolerating the Mounjaro  well and medication adherence appears optimal overall. He is agreeable to increasing basal insulin , and I will call to see how he's doing next month before he sees Dr. Newlin in December.  -INCREASE Lantus  to 54 units daily. -Continued Mounjaro  15mg  weekly.  -Continued Metformin  1000 mg BID. -Continued Jardiance  25 mg daily.  -Extensively discussed pathophysiology of diabetes, recommended lifestyle interventions, dietary effects on blood sugar control -Counseled on s/sx of and management of hypoglycemia and patient verbalized understanding. -Next A1C anticipated 01/2024.  Written patient instructions provided.  Total time in face to face counseling 25 minutes.   Follow up w/ PCP in 2 months. Me via telephone in 1 month.  Herlene Fleeta Morris, PharmD, JAQUELINE, CPP Clinical Pharmacist [facility] & [facility] [phone]

## 2023-12-25 ENCOUNTER — Other Ambulatory Visit: Payer: Self-pay

## 2023-12-28 ENCOUNTER — Emergency Department (HOSPITAL_COMMUNITY): Admission: EM | Admit: 2023-12-28 | Discharge: 2023-12-28 | Disposition: A | Discharge status: Home / Self Care

## 2023-12-28 ENCOUNTER — Other Ambulatory Visit: Payer: Self-pay

## 2023-12-28 ENCOUNTER — Emergency Department (HOSPITAL_COMMUNITY)

## 2023-12-28 ENCOUNTER — Encounter (HOSPITAL_COMMUNITY): Payer: Self-pay

## 2023-12-28 ENCOUNTER — Ambulatory Visit (HOSPITAL_COMMUNITY)
Admission: EM | Admit: 2023-12-28 | Discharge: 2023-12-28 | Disposition: A | Discharge status: Home / Self Care | Attending: Family Medicine | Admitting: Family Medicine

## 2023-12-28 DIAGNOSIS — R10A0 Flank pain, unspecified side: Secondary | ICD-10-CM | POA: Insufficient documentation

## 2023-12-28 DIAGNOSIS — E119 Type 2 diabetes mellitus without complications: Secondary | ICD-10-CM | POA: Insufficient documentation

## 2023-12-28 DIAGNOSIS — Z794 Long term (current) use of insulin: Secondary | ICD-10-CM | POA: Diagnosis not present

## 2023-12-28 DIAGNOSIS — Z79899 Other long term (current) drug therapy: Secondary | ICD-10-CM | POA: Diagnosis not present

## 2023-12-28 DIAGNOSIS — M545 Low back pain, unspecified: Secondary | ICD-10-CM

## 2023-12-28 DIAGNOSIS — I10 Essential (primary) hypertension: Secondary | ICD-10-CM | POA: Insufficient documentation

## 2023-12-28 DIAGNOSIS — R1012 Left upper quadrant pain: Secondary | ICD-10-CM | POA: Diagnosis not present

## 2023-12-28 DIAGNOSIS — R109 Unspecified abdominal pain: Secondary | ICD-10-CM | POA: Insufficient documentation

## 2023-12-28 DIAGNOSIS — M546 Pain in thoracic spine: Secondary | ICD-10-CM | POA: Insufficient documentation

## 2023-12-28 DIAGNOSIS — Z7984 Long term (current) use of oral hypoglycemic drugs: Secondary | ICD-10-CM | POA: Diagnosis not present

## 2023-12-28 LAB — COMPREHENSIVE METABOLIC PANEL WITH GFR
ALT: 15 U/L (ref 0–44)
AST: 20 U/L (ref 15–41)
Albumin: 4.3 g/dL (ref 3.5–5.0)
Alkaline Phosphatase: 96 U/L (ref 38–126)
Anion gap: 10 (ref 5–15)
BUN: 11 mg/dL (ref 8–23)
CO2: 26 mmol/L (ref 22–32)
Calcium: 9.5 mg/dL (ref 8.9–10.3)
Chloride: 101 mmol/L (ref 98–111)
Creatinine, Ser: 1.02 mg/dL (ref 0.61–1.24)
GFR, Estimated: >60 mL/min (ref >60)
Glucose, Bld: 164 mg/dL — ABNORMAL HIGH (ref 70–99)
Potassium: 4.3 mmol/L (ref 3.5–5.1)
Sodium: 136 mmol/L (ref 135–145)
Total Bilirubin: 0.4 mg/dL (ref 0.0–1.2)
Total Protein: 8 g/dL (ref 6.5–8.1)

## 2023-12-28 LAB — POCT URINALYSIS DIP (MANUAL ENTRY)
Bilirubin, UA: NEGATIVE
Blood, UA: NEGATIVE
Glucose, UA: >=1000 mg/dL — AB
Leukocytes, UA: NEGATIVE
Nitrite, UA: NEGATIVE
Protein Ur, POC: NEGATIVE mg/dL
Spec Grav, UA: 1.02 (ref 1.010–1.025)
Urobilinogen, UA: 0.2 U/dL
pH, UA: 5.5 (ref 5.0–8.0)

## 2023-12-28 LAB — URINALYSIS, ROUTINE W REFLEX MICROSCOPIC
Bacteria, UA: NONE SEEN
Bilirubin Urine: NEGATIVE
Glucose, UA: >=500 mg/dL — AB
Hgb urine dipstick: NEGATIVE
Ketones, ur: NEGATIVE mg/dL
Leukocytes,Ua: NEGATIVE
Nitrite: NEGATIVE
Protein, ur: NEGATIVE mg/dL
Specific Gravity, Urine: 1.031 — ABNORMAL HIGH (ref 1.005–1.030)
pH: 5 (ref 5.0–8.0)

## 2023-12-28 LAB — CBC
HCT: 47.6 % (ref 39.0–52.0)
Hemoglobin: 15.2 g/dL (ref 13.0–17.0)
MCH: 28.4 pg (ref 26.0–34.0)
MCHC: 31.9 g/dL (ref 30.0–36.0)
MCV: 89 fL (ref 80.0–100.0)
Platelets: 262 K/uL (ref 150–400)
RBC: 5.35 MIL/uL (ref 4.22–5.81)
RDW: 13.6 % (ref 11.5–15.5)
WBC: 9.4 K/uL (ref 4.0–10.5)
nRBC: 0 % (ref 0.0–0.2)

## 2023-12-28 LAB — LIPASE, BLOOD: Lipase: 24 U/L (ref 11–51)

## 2023-12-28 MED ORDER — MORPHINE SULFATE (PF) 4 MG/ML IV SOLN
4.0000 mg | Freq: Once | INTRAVENOUS | Status: AC
  Administered 2023-12-28: 4 mg via INTRAVENOUS
  Filled 2023-12-28: qty 1

## 2023-12-28 MED ORDER — NAPROXEN 500 MG PO TABS
500.0000 mg | ORAL_TABLET | Freq: Two times a day (BID) | ORAL | 0 refills | Status: DC
  Filled 2023-12-28: qty 30, 15d supply, fill #0

## 2023-12-28 MED ORDER — METHOCARBAMOL 500 MG PO TABS
500.0000 mg | ORAL_TABLET | Freq: Two times a day (BID) | ORAL | 0 refills | Status: DC
  Filled 2023-12-28: qty 20, 10d supply, fill #0

## 2023-12-28 MED ORDER — ONDANSETRON HCL 4 MG/2ML IJ SOLN
4.0000 mg | Freq: Once | INTRAMUSCULAR | Status: AC
  Administered 2023-12-28: 4 mg via INTRAVENOUS
  Filled 2023-12-28: qty 2

## 2023-12-28 MED ORDER — METHOCARBAMOL 500 MG PO TABS
500.0000 mg | ORAL_TABLET | Freq: Two times a day (BID) | ORAL | 0 refills | Status: AC
Start: 2023-12-28 — End: ?

## 2023-12-28 MED ORDER — IOHEXOL 300 MG/ML  SOLN
100.0000 mL | Freq: Once | INTRAMUSCULAR | Status: AC | PRN
  Administered 2023-12-28: 100 mL via INTRAVENOUS

## 2023-12-28 MED ORDER — NAPROXEN 500 MG PO TABS: 500.0000 mg | ORAL_TABLET | Freq: Two times a day (BID) | ORAL | 0 refills | Status: AC

## 2023-12-28 NOTE — Discharge Instructions (Addendum)
 Please take the naproxen  twice daily as well as the methocarbamol twice daily as needed.  Do not drive or drink alcohol taking the methocarbamol to make you drowsy.  Your workup today was reassuring.  The above medications are more for if you do have a muscle strain.  Please call and schedule an appointment with a GI doctor at the number provided regarding the abdominal discomfort.  Continue the fiber supplements and return to the ER for fevers or worsening symptoms.

## 2023-12-28 NOTE — ED Triage Notes (Signed)
 Pt states left lower back pain for the past 4 days.  States he has been using a heating pad with some relief.  States this morning he started having left sided abdominal pain states he took a laxative and had a BM and is feeling a little better.

## 2023-12-28 NOTE — ED Notes (Signed)
 Patient is being discharged from the Urgent Care and sent to the Emergency Department via private vehicle . Per Dr Rolinda, patient is in need of higher level of care due to abdomen pain. Patient is aware and verbalizes understanding of plan of care.  Vitals:   12/28/23 1650  BP: (!) 146/93  Pulse: 92  Resp: 16  Temp: 98.2 F (36.8 C)  SpO2: 95%

## 2023-12-28 NOTE — ED Triage Notes (Signed)
 Patient said his abdomen has been swollen for 2 months. Had a bowel movement today. Feels nauseous.

## 2023-12-28 NOTE — ED Provider Notes (Signed)
 [facility] Provider Note   CSN: [id number] Arrival date & time: 12/28/23  8162     Patient presents with: Abdominal Swelling   Ray Garner is a 68 y.o. male.   Sick 14-year-old male with past medical history of diabetes and hypertension presenting to the emergency department today with abdominal and flank pain.  The patient states this been going now for the past few days.  The patient states that he has had some nausea but denies any vomiting.  Reports that he did take some laxatives and did have a bowel movement yesterday.  He states that his abdomen has been more distended than normal over the past few days.  Denies any fevers.  He came to the emergency department today due to these ongoing symptoms.  He states that he is also been having some left-sided flank pain as well.  He went to urgent care and was told that this was due to a pulled muscle.  He denies any rashes overlying.  Denies any blood in his urine or urinary symptoms associated with this.  The patient denies any midline back tenderness.  Denies any bowel or bladder dysfunction has not had any saddle anesthesia.        Prior to Admission medications   Medication Sig Start Date End Date Taking? Authorizing Provider  methocarbamol (ROBAXIN) 500 MG tablet Take 1 tablet (500 mg total) by mouth 2 (two) times daily. 12/28/23  Yes Ula Prentice SAUNDERS, MD  naproxen  (NAPROSYN ) 500 MG tablet Take 1 tablet (500 mg total) by mouth 2 (two) times daily. 12/28/23  Yes Ula Prentice SAUNDERS, MD  allopurinol  (ZYLOPRIM ) 300 MG tablet Take 1 tablet (300 mg total) by mouth daily. 11/12/23   Newlin, Enobong, MD  amLODipine  (NORVASC ) 10 MG tablet Take 1 tablet (10 mg total) by mouth daily. 08/12/23   Newlin, Enobong, MD  atorvastatin  (LIPITOR) 40 MG tablet Take 1 tablet (40 mg total) by mouth daily. 08/12/23   Newlin, Enobong, MD  baclofen  (LIORESAL ) 10 MG tablet Take 1-2 tablets (10-20 mg total) by mouth 3 (three)  times daily as needed muscle spasm. 11/22/23   Rodriguez-Southworth, Sylvia, PA-C  carvedilol  (COREG ) 6.25 MG tablet TAKE 1 TABLET (6.25 MG TOTAL) BY MOUTH 2 (TWO) TIMES DAILY WITH A MEAL. 10/12/21 11/22/23  Danton Jon HERO, PA-C  Continuous Blood Gluc Receiver (FREESTYLE LIBRE 14 DAY READER) DEVI 1 each by Does not apply route 3 (three) times daily with meals. 07/11/17   Newlin, Enobong, MD  Continuous Blood Gluc Sensor (FREESTYLE LIBRE 14 DAY SENSOR) MISC 1 each by Does not apply route 3 (three) times daily with meals. 01/29/20   Newlin, Enobong, MD  empagliflozin  (JARDIANCE ) 25 MG TABS tablet Take 1 tablet (25 mg total) by mouth daily before breakfast. 08/12/23   Delbert Clam, MD  ergocalciferol  (DRISDOL ) 1.25 MG (50000 UT) capsule Take 1 capsule (50,000 Units total) by mouth once a week. 11/13/23   Newlin, Enobong, MD  glucose blood (BAYER CONTOUR TEST) test strip Use as instructed 07/08/17   Berneta Elsie Sayre, MD  hydrocortisone  1 % ointment Apply 1 application topically 2 (two) times daily. 11/12/18   Newlin, Enobong, MD  insulin  glargine (LANTUS  SOLOSTAR) 100 UNIT/ML Solostar Pen Inject 54 Units into the skin daily. 11/12/23   Newlin, Enobong, MD  Insulin  Pen Needle (TRUEPLUS PEN NEEDLES) 31G X 6 MM MISC USE AS DIRECTED. 03/05/23   Newlin, Enobong, MD  levothyroxine  (SYNTHROID ) 300 MCG tablet Take 1  tablet (300 mcg total) by mouth daily before breakfast. 11/13/23   Newlin, Enobong, MD  lidocaine -prilocaine  (EMLA ) cream Apply 1 Application topically as needed. 11/12/23   Newlin, Enobong, MD  metFORMIN  (GLUCOPHAGE ) 500 MG tablet Take 2 tablets (1,000 mg total) by mouth 2 (two) times daily with a meal. 08/12/23   Delbert Clam, MD  metoCLOPramide  (REGLAN ) 5 MG tablet Take 1 tablet (5 mg total) by mouth 3 (three) times daily before meals. 08/12/23   Newlin, Enobong, MD  omeprazole  (PRILOSEC) 20 MG capsule Take 1 capsule (20 mg total) by mouth daily. 08/12/23   Newlin, Enobong, MD   promethazine -dextromethorphan (PROMETHAZINE -DM) 6.25-15 MG/5ML syrup Take 5 mLs by mouth 4 (four) times daily as needed for cough. 11/12/23   Newlin, Enobong, MD  sildenafil  (VIAGRA ) 100 MG tablet Take 1 tablet (100 mg total) by mouth daily as needed for erectile dysfunction. 03/04/23   Newlin, Enobong, MD  tirzepatide  (MOUNJARO ) 15 MG/0.5ML Pen Inject 15 mg into the skin once a week. 09/12/23   Newlin, Enobong, MD  valsartan  (DIOVAN ) 160 MG tablet Take 1 tablet (160 mg total) by mouth daily. 11/12/23   Newlin, Enobong, MD  vitamin B-12 (CYANOCOBALAMIN) 1000 MCG tablet Take 1,000 mcg by mouth daily.    [provider]    Allergies: Vicodin [hydrocodone -acetaminophen ]    Review of Systems  Gastrointestinal:  Positive for abdominal pain.  Genitourinary:  Positive for flank pain.  All other systems reviewed and are negative.   Updated Vital Signs BP (!) 154/109 (BP Location: Right Arm)   Pulse 78   Temp 98.3 F (36.8 C) (Oral)   Resp 18   Ht 6' (1.829 m)   Wt 129.3 kg   SpO2 98%   BMI 38.65 kg/m   Physical Exam Vitals and nursing note reviewed.   Gen: NAD Eyes: PERRL, EOMI HEENT: no oropharyngeal swelling Neck: trachea midline Resp: clear to auscultation bilaterally Card: RRR, no murmurs, rubs, or gallops Abd: Mildly distended, moderate diffuse tenderness with no guarding or rebound MSK: The patient does have some left-sided paraspinal tenderness over the lower thoracic region, there is CVA tenderness noted, no midline tenderness is noted Extremities: no calf tenderness, no edema Vascular: 2+ radial pulses bilaterally, 2+ DP pulses bilaterally Skin: no rashes Psyc: acting appropriately   (all labs ordered are listed, but only abnormal results are displayed) Labs Reviewed  COMPREHENSIVE METABOLIC PANEL WITH GFR - Abnormal; Notable for the following components:      Result Value   Glucose, Bld 164 (*)    All other components within normal limits  URINALYSIS, ROUTINE  W REFLEX MICROSCOPIC - Abnormal; Notable for the following components:   Specific Gravity, Urine 1.031 (*)    Glucose, UA >=500 (*)    All other components within normal limits  LIPASE, BLOOD  CBC    EKG: None  Radiology: CT ABDOMEN PELVIS W CONTRAST Result Date: 12/28/2023 EXAM: CT ABDOMEN AND PELVIS WITH CONTRAST 12/28/2023 09:33:56 PM TECHNIQUE: CT of the abdomen and pelvis was performed with the administration of 100 mL of iohexol  (OMNIPAQUE ) 300 MG/ML solution. Multiplanar reformatted images are provided for review. Automated exposure control, iterative reconstruction, and/or weight-based adjustment of the mA/kV was utilized to reduce the radiation dose to as low as reasonably achievable. COMPARISON: 11/22/2023 CLINICAL HISTORY: Abdominal pain, acute, nonlocalized. FINDINGS: LOWER CHEST: Scattered coronary artery and aortic calcifications. LIVER: The liver is unremarkable. GALLBLADDER AND BILE DUCTS: Gallbladder is unremarkable. No biliary ductal dilatation. SPLEEN: No acute abnormality. PANCREAS: No acute  abnormality. ADRENAL GLANDS: No acute abnormality. KIDNEYS, URETERS AND BLADDER: Stable small cyst in the upper pole of the right kidney. Per consensus, no follow-up is needed for simple Bosniak type 1 and 2 renal cysts, unless the patient has a malignancy history or risk factors. No stones in the kidneys or ureters. No hydronephrosis. No perinephric or periureteral stranding. Urinary bladder is unremarkable. GI AND BOWEL: Stomach demonstrates no acute abnormality. There is no bowel obstruction. PERITONEUM AND RETROPERITONEUM: No ascites. No free air. VASCULATURE: Aorta is normal in caliber. LYMPH NODES: No lymphadenopathy. REPRODUCTIVE ORGANS: Prostate enlargement. BONES AND SOFT TISSUES: Degenerative disc and facet disease in the lower lumbar spine. No focal soft tissue abnormality. IMPRESSION: 1. No acute findings in the abdomen or pelvis. Electronically signed by: Franky Crease MD 12/28/2023  09:41 PM EDT RP Workstation: [id number]     Procedures   Medications Ordered in the ED  morphine  (PF) 4 MG/ML injection 4 mg (4 mg Intravenous Given 12/28/23 2105)  ondansetron  (ZOFRAN ) injection 4 mg (4 mg Intravenous Given 12/28/23 2105)  iohexol  (OMNIPAQUE ) 300 MG/ML solution 100 mL (100 mLs Intravenous Contrast Given 12/28/23 2123)                                    Medical Decision Making 67 year old male with past medical history of diabetes and hypertension presenting to the emergency department today with abdominal and flank pain.  I will further evaluate the patient here with basic labs including LFTs and lipase.  Also obtain a urinalysis here to eval for polynephritis in addition to hepatobiliary otology with the above workup.  Will obtain CT scan of his abdomen given his age to evaluate for diverticulitis, colitis, obstruction, or other intra-abdominal pathology.  I will reevaluate for ultimate disposition.  The patient's labs here are reassuring.  Urinalysis does not show any findings consistent with infection.  The patient does not appear to be in DKA based on labs.  His CT scan does not show any concerning findings.  He is discharged with return precautions.  Amount and/or Complexity of Data Reviewed Labs: ordered. Radiology: ordered.  Risk Prescription drug management.        Final diagnoses:  Abdominal pain, unspecified abdominal location  Flank pain, unspecified laterality    ED Discharge Orders          Ordered    naproxen  (NAPROSYN ) 500 MG tablet  2 times daily        12/28/23 2211    methocarbamol (ROBAXIN) 500 MG tablet  2 times daily        12/28/23 2211               Ula Prentice SAUNDERS, MD 12/28/23 2212

## 2023-12-29 ENCOUNTER — Other Ambulatory Visit: Payer: Self-pay

## 2023-12-30 ENCOUNTER — Ambulatory Visit: Payer: Self-pay

## 2023-12-30 NOTE — Telephone Encounter (Signed)
 FYI Only or Action Required?: Action required by provider: request for appointment.  Patient was last seen in primary care on 11/12/2023 by Newlin, Enobong, MD.  Called Nurse Triage reporting No chief complaint on file..   Caller disconnected during warm transfer.  Copied from CRM #[id number]. Topic: Clinical - Red Word Triage >> Dec 30, 2023  8:04 AM Everette C wrote: [facility] that prompted transfer to Nurse Triage: The patient is experiencing back and stomach swelling with little to no appetite. The patient has experienced the discomfort for a few weeks but this weekend it got worse and they were seen in the ED on Saturday 12/28/23 but the patient shares that the /discomfort has increased since then

## 2023-12-30 NOTE — Telephone Encounter (Signed)
 Please reach out to patient and schedule him a HFU

## 2023-12-30 NOTE — Telephone Encounter (Signed)
 FYI Only or Action Required?: Action required by provider: request for appointment and update on patient condition.  Patient was last seen in primary care on 11/12/2023 by Newlin, Enobong, MD.  Called Nurse Triage reporting Abdominal Pain.  Symptoms began several weeks ago.  Interventions attempted: Rest, hydration, or home remedies.  Symptoms are: unchanged.  Triage Disposition: See PCP Within 2 Weeks  Patient/caregiver understands and will follow disposition?: No, wishes to speak with PCP  Copied from CRM [id number]. Topic: Clinical - Red Word Triage >> Dec 30, 2023  8:41 AM Antony RAMAN wrote: Red Word that prompted transfer to Nurse Triage: stomach swollen tight and painful Reason for Disposition  Abdominal pain is a chronic symptom (recurrent or ongoing AND present > 4 weeks)  Answer Assessment - Initial Assessment Questions Patient reports he has been having lower left abdominal pain that he states the area is swollen and tight. Patient was seen in the ED on 11/1-had a CT scan completed. Patient is asking for an appointment.   1. LOCATION: Where does it hurt?      Lower left abdominal area 2. RADIATION: Does the pain shoot anywhere else? (e.g., chest, back)     At times with radiate into the chest 3. ONSET: When did the pain begin? (Minutes, hours or days ago)      Patient reports pain has been going on for a while.  4. SUDDEN: Gradual or sudden onset?     Sudden onset 5. PATTERN Does the pain come and go, or is it constant?     constant 6. SEVERITY: How bad is the pain?  (e.g., Scale 1-10; mild, moderate, or severe)     Pain is 10 7. RECURRENT SYMPTOM: Have you ever had this type of stomach pain before? If Yes, ask: When was the last time? and What happened that time?      no 8. CAUSE: What do you think is causing the stomach pain? (e.g., gallstones, recent abdominal surgery)     unsure 9. RELIEVING/AGGRAVATING FACTORS: What makes it better or worse?  (e.g., antacids, bending or twisting motion, bowel movement)     Patient reports having to prop himself on a heating pad to help with the pain. Reports he is having sit up straight to sleep.  10. OTHER SYMPTOMS: Do you have any other symptoms? (e.g., back pain, diarrhea, fever, urination pain, vomiting)       no  Protocols used: Abdominal Pain - Male-A-AH

## 2024-01-01 NOTE — Telephone Encounter (Signed)
 Patient canceled appointment this Friday due to him being scheduled with his GI doctor.

## 2024-01-01 NOTE — ED Provider Notes (Signed)
 [facility]   [id number] 12/28/23 Arrival Time: 1540  ASSESSMENT & PLAN:  1. Left upper quadrant abdominal pain   2. Acute left-sided low back pain without sciatica    Limited diagnostic capabilities here for acute abd pain; discussed. D/C to ED via POV; stable upon discharge.   Follow-up Information     Go to  [facility] Emergency Department at [facility].   Specialty: Emergency Medicine Contact information: [address] [place]  [phone]                Reviewed expectations re: course of current medical issues. Questions answered. Outlined signs and symptoms indicating need for more acute intervention. Patient verbalized understanding. After Visit Summary given.   SUBJECTIVE: History from: patient. Ray Garner is a 68 y.o. male who presents with complaint of left lower back pain for the past 4 days.  States he has been using a heating pad with some relief.  States this morning he started having left sided abdominal pain states he took a laxative and had a BM and is feeling a little better. Here the abd pain is bothering him the most. Mild nausea. Denies fever.  Past Surgical History:  Procedure Laterality Date   CYST EXCISION     FRACTURE SURGERY     OBJECTIVE:  Vitals:   12/28/23 1650  BP: (!) 146/93  Pulse: 92  Resp: 16  Temp: 98.2 F (36.8 C)  TempSrc: Oral  SpO2: 95%    General appearance: alert, oriented, no acute distress HEENT: ; AT; oropharynx moist Lungs: unlabored respirations Abdomen: soft; with mild distention; without guarding or rebound tenderness Back: without reported CVA tenderness; FROM at waist Extremities: without LE edema; symmetrical; without gross deformities Skin: warm and dry Neurologic: normal gait Psychological: alert and cooperative; normal mood and affect  Labs: Results for orders placed or performed during the hospital encounter of 12/28/23  POC urinalysis  dipstick   Collection Time: 12/28/23  5:06 PM  Result Value Ref Range   Color, UA yellow yellow   Clarity, UA cloudy (A) clear   Glucose, UA >=1,000 (A) negative mg/dL   Bilirubin, UA negative negative   Ketones, POC UA small (15) (A) negative mg/dL   Spec Grav, UA 8.979 8.989 - 1.025   Blood, UA negative negative   pH, UA 5.5 5.0 - 8.0   Protein Ur, POC negative negative mg/dL   Urobilinogen, UA 0.2 0.2 or 1.0 E.U./dL   Nitrite, UA Negative Negative   Leukocytes, UA Negative Negative   Labs Reviewed  POCT URINALYSIS DIP (MANUAL ENTRY) - Abnormal; Notable for the following components:      Result Value   Clarity, UA cloudy (*)    Glucose, UA >=1,000 (*)    Ketones, POC UA small (15) (*)    All other components within normal limits    Imaging: No results found.   Allergies  Allergen Reactions   Vicodin [Hydrocodone -Acetaminophen ] Hives and Rash                                               Past Medical History:  Diagnosis Date   Anemia    Arthritis    Back pain, lumbosacral 06/30/2013   Blood transfusion without reported diagnosis    Cataract    BEGINNING   DM type 2 (diabetes mellitus,  type 2) (HCC) 06/30/2013   Gout    HTN (hypertension) 06/30/2013   Hypertension    Hypothyroid 06/30/2013    Social History   Socioeconomic History   Marital status: Divorced    Spouse name: Not on file   Number of children: Not on file   Years of education: Not on file   Highest education level: Not on file  Occupational History   Not on file  Tobacco Use   Smoking status: Former    Current packs/day: 0.00    Types: Cigarettes    Quit date: 05/14/1983    Years since quitting: 40.6   Smokeless tobacco: Former  Building Services Engineer status: Former  Substance and Sexual Activity   Alcohol use: Yes    Comment: OCC.   Drug use: No   Sexual activity: Not on file  Other Topics Concern   Not on file  Social History Narrative   Not on file   Social Drivers of Health    Financial Resource Strain: Low Risk  (10/09/2023)   Overall Financial Resource Strain (CARDIA)    Difficulty of Paying Living Expenses: Not hard at all  Food Insecurity: No Food Insecurity (10/09/2023)   Hunger Vital Sign    Worried About Running Out of Food in the Last Year: Never true    Ran Out of Food in the Last Year: Never true  Transportation Needs: No Transportation Needs (10/09/2023)   PRAPARE - Administrator, Civil Service (Medical): No    Lack of Transportation (Non-Medical): No  Physical Activity: Sufficiently Active (10/09/2023)   Exercise Vital Sign    Days of Exercise per Week: 5 days    Minutes of Exercise per Session: 30 min  Stress: No Stress Concern Present (10/09/2023)   [employer] of Occupational Health - Occupational Stress Questionnaire    Feeling of Stress: Not at all  Social Connections: Moderately Integrated (10/09/2023)   Social Connection and Isolation Panel    Frequency of Communication with Friends and Family: More than three times a week    Frequency of Social Gatherings with Friends and Family: More than three times a week    Attends Religious Services: More than 4 times per year    Active Member of [employer] or Organizations: No    Attends Banker Meetings: 1 to 4 times per year    Marital Status: Divorced  Catering Manager Violence: Not At Risk (10/09/2023)   Humiliation, Afraid, Rape, and Kick questionnaire    Fear of Current or Ex-Partner: No    Emotionally Abused: No    Physically Abused: No    Sexually Abused: No    Family History  Problem Relation Age of Onset   Breast cancer Niece    Colon cancer Neg Hx    Colon polyps Neg Hx    Esophageal cancer Neg Hx    Rectal cancer Neg Hx    Stomach cancer Neg Hx      Rolinda Rogue, MD 01/01/24 [phone]

## 2024-01-03 ENCOUNTER — Encounter: Payer: Self-pay | Admitting: Gastroenterology

## 2024-01-03 ENCOUNTER — Ambulatory Visit: Admitting: Internal Medicine

## 2024-01-03 ENCOUNTER — Other Ambulatory Visit: Payer: Self-pay

## 2024-01-03 ENCOUNTER — Ambulatory Visit: Admitting: Gastroenterology

## 2024-01-03 VITALS — BP 110/80 | HR 92 | Ht 71.5 in | Wt 269.0 lb

## 2024-01-03 DIAGNOSIS — R6881 Early satiety: Secondary | ICD-10-CM

## 2024-01-03 DIAGNOSIS — R110 Nausea: Secondary | ICD-10-CM | POA: Diagnosis not present

## 2024-01-03 DIAGNOSIS — K219 Gastro-esophageal reflux disease without esophagitis: Secondary | ICD-10-CM | POA: Diagnosis not present

## 2024-01-03 DIAGNOSIS — R1319 Other dysphagia: Secondary | ICD-10-CM | POA: Diagnosis not present

## 2024-01-03 DIAGNOSIS — K5900 Constipation, unspecified: Secondary | ICD-10-CM

## 2024-01-03 DIAGNOSIS — Z860101 Personal history of adenomatous and serrated colon polyps: Secondary | ICD-10-CM

## 2024-01-03 DIAGNOSIS — R140 Abdominal distension (gaseous): Secondary | ICD-10-CM

## 2024-01-03 DIAGNOSIS — R1084 Generalized abdominal pain: Secondary | ICD-10-CM

## 2024-01-03 MED ORDER — NA SULFATE-K SULFATE-MG SULF 17.5-3.13-1.6 GM/177ML PO SOLN
1.0000 | ORAL | 0 refills | Status: DC
  Filled 2024-01-03: qty 354, 2d supply, fill #0
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
## 2024-01-03 NOTE — Patient Instructions (Signed)
 We have sent the following medications to your pharmacy for you to pick up at your convenience: Suprep   Use heating pad and NSAIDs as needed for Back Pain  Try Salonpas patches as directed.   Follow-up with your PCP regarding Back pain.   Please purchase the following medications over the counter and take as directed: Miralax - 1 capful daily   You have been scheduled for an endoscopy and colonoscopy. Please follow the written instructions given to you at your visit today.  If you use inhalers (even only as needed), please bring them with you on the day of your procedure.  DO NOT TAKE 7 DAYS PRIOR TO TEST- Trulicity  (dulaglutide ) Ozempic, Wegovy (semaglutide) Mounjaro , Zepbound  (tirzepatide ) Bydureon Bcise (exanatide extended release)  DO NOT TAKE 1 DAY PRIOR TO YOUR TEST Rybelsus (semaglutide) Adlyxin (lixisenatide) Victoza  (liraglutide ) Byetta (exanatide) ___________________________________________________________________________  Due to recent changes in healthcare laws, you may see the results of your imaging and laboratory studies on MyChart before your provider has had a chance to review them.  We understand that in some cases there may be results that are confusing or concerning to you. Not all laboratory results come back in the same time frame and the provider may be waiting for multiple results in order to interpret others.  Please give us  48 hours in order for your provider to thoroughly review all the results before contacting the office for clarification of your results.   Thank you for choosing me and [facility]. Camie Furbish, PA-C

## 2024-01-06 ENCOUNTER — Other Ambulatory Visit: Payer: Self-pay

## 2024-01-06 ENCOUNTER — Encounter: Payer: Self-pay | Admitting: Gastroenterology

## 2024-01-15 ENCOUNTER — Ambulatory Visit: Payer: Self-pay | Admitting: *Deleted

## 2024-01-15 NOTE — Telephone Encounter (Signed)
 FYI Only or Action Required?: Action required by provider: request for appointment.  Patient was last seen in primary care on 11/12/2023 by Newlin, Enobong, MD.  Called Nurse Triage reporting Constipation ( Hiatal hernia).  Symptoms began several weeks ago.  Interventions attempted: OTC medications: MOM, Miralax .  Symptoms are: gradually worsening.  Triage Disposition: See PCP When Office is Open (Within 3 Days)  Patient/caregiver understands and will follow disposition?: Yes  Patient has multiple concerns: constipation, bloating, weight loss, hiatal hernia- unable to eat without feeling it in throat. No open appointment with provider- will send message to see if patient can be scheduled    Copied from CRM #[id number]. Topic: Clinical - Red Word Triage >> Jan 15, 2024 12:03 PM Pinkey ORN wrote: Red Word that prompted transfer to Nurse Triage: Swelling + Constipation >> Jan 15, 2024 12:04 PM Pinkey ORN wrote: Patient states he's experiencing some swelling in his stomach, as well as inability to pass a bowel movement without laxatives.  Reason for Disposition  Unable to have a bowel movement (BM) without laxative or enema  Answer Assessment - Initial Assessment Questions 1. STOOL PATTERN OR FREQUENCY: How often do you have a bowel movement (BM)?  (Normal range: 3 times a day to every 3 days)  When was your last BM?       2-3 days- has to use OTC prep- MOM, Miralax -dialy 2. STRAINING: Do you have to strain to have a BM?      yes 3. ONSET: When did the constipation begin?     Chronic-worse in the lst 3 weeks 4. RECTAL PAIN: Does your rectum hurt when the stool comes out? If Yes, ask: Do you have hemorrhoids? How bad is the pain?  (Scale 1-10; or mild, moderate, severe)     Hemorrhoids-active- mild pain 5. BM COMPOSITION: Are the stools hard?      Mostly hard 6. BLOOD ON STOOLS: Has there been any blood on the toilet tissue or on the surface of the BM? If Yes, ask:  When was the last time?     Sometimes sees a little blood 7. CHRONIC CONSTIPATION: Is this a new problem for you?  If No, ask: How long have you had this problem? (days, weeks, months)      chronic 8. CHANGES IN DIET OR HYDRATION: Have there been any recent changes in your diet? How much fluids are you drinking on a daily basis?  How much have you had to drink today?     Not eating much due to hiatal hernia- feels full in throat 9. MEDICINES: Have you been taking any new medicines? Are you taking any narcotic pain medicines? (e.g., Dilaudid, morphine , Percocet, Vicodin)     Recent pain Rx from ED 10. LAXATIVES: Have you been using any stool softeners, laxatives, or enemas?  If Yes, ask What are you using, how often, and when was the last time?       Yes- see above 11. ACTIVITY:  How much walking do you do every day?  Has your activity level decreased in the past week?        Patient is active- last 2 weeks- unable to do much 12. CAUSE: What do you think is causing the constipation?        Unsure - has colonoscopy 12/23 13. MEDICAL HISTORY: Do you have a history of hemorrhoids, rectal fissures, rectal surgery, or rectal abscess?         Hemorrhoid hx 14. OTHER SYMPTOMS: Do you  have any other symptoms? (e.g., abdomen pain, bloating, fever, vomiting)       Pain in abdomen and side, weight loss- 268lb  Protocols used: Constipation-A-AH

## 2024-01-15 NOTE — Telephone Encounter (Signed)
 Spoke with patient patient is going to call [facility].

## 2024-01-16 NOTE — Telephone Encounter (Signed)
 Agree with RN disposition as he just saw GI for same on the seventh and he needs to speak with them about ongoing symptoms.

## 2024-01-17 ENCOUNTER — Telehealth: Payer: Self-pay | Admitting: Family Medicine

## 2024-01-17 ENCOUNTER — Other Ambulatory Visit: Payer: Self-pay

## 2024-01-17 ENCOUNTER — Other Ambulatory Visit: Payer: Self-pay | Admitting: Family Medicine

## 2024-01-17 DIAGNOSIS — E039 Hypothyroidism, unspecified: Secondary | ICD-10-CM

## 2024-01-17 DIAGNOSIS — M1A9XX0 Chronic gout, unspecified, without tophus (tophi): Secondary | ICD-10-CM

## 2024-01-17 MED ORDER — LEVOTHYROXINE SODIUM 125 MCG PO TABS
250.0000 ug | ORAL_TABLET | Freq: Every day | ORAL | 1 refills | Status: DC
  Filled 2024-01-17: qty 180, 90d supply, fill #0

## 2024-01-17 MED ORDER — ALLOPURINOL 300 MG PO TABS
300.0000 mg | ORAL_TABLET | Freq: Every day | ORAL | 0 refills | Status: DC
  Filled 2024-01-17: qty 30, 30d supply, fill #0

## 2024-01-17 NOTE — Telephone Encounter (Signed)
 Confirmed appt for 11/24

## 2024-01-19 NOTE — Progress Notes (Unsigned)
 S:     No chief complaint on file.  68 y.o. male who presents for diabetes evaluation, education, and management. PMH is significant for T2DM, HTN, hypothyroidism, gout. Patient reports Diabetes was diagnosed in ***.   Patient was referred and last seen by Primary Care Provider, Dr. Delbert, MD, on 11/12/23. A1c at that visit was down to 8.2% from 8.7%. Was endorsing pain and irritation at site of Mounjaro  injection but elected to continue given the benefit.  Pharmacy last saw patient on 12/17/23. No reported side effects with Mounjaro  or other medications. FBG average of 150s in the morning. Lantus  was increased to 54 units to target FBG goal 80-130s. Since last pharmacy visit, patient presented to ED with abdominal pain. Workup was negative and patient was discharged.  Patient arrives in *** good spirits and presents without *** any assistance. ***Patient is accompanied by ***.   Family/Social History: -Fhx: no pertinent positives -Tobacco use: Former smoker (quit 1985)  Current diabetes medications include: Jardiance  25 mg daily, metformin  1000 mg BID, Lantus  54 units daily, Mounjaro  15 mg [place] weekly Current hypertension medications include: amlodipine  10 mg daily, carvedilol  6.25 mg BID with meals, valsartan  160 mg daily Current hyperlipidemia medications include: atorvastatin  40 mg daily  Insurance coverage: [facility] Medicare  Patient {Actions; denies-reports:120008} hypoglycemic events.  Reported home fasting blood sugars: ***  Reported 2 hour post-meal/random blood sugars: ***.  Patient {Actions; denies-reports:120008} nocturia (nighttime urination).  Patient {Actions; denies-reports:120008} neuropathy (nerve pain). Patient {Actions; denies-reports:120008} visual changes. Patient {Actions; denies-reports:120008} self foot exams.   Patient reported dietary habits: Breakfast: green beans, ham biscuit  Lunch: green beans, roasted chicken, limits fast food, steak every now and  then, fish, turkey Dinner: limits starches Snacks: ritz crackers with cheese Drinks: water, cranberry juice, limits sodas, Gatorade every now and then   Patient-reported exercise habits: 20-30 minutes every other day  O:  7 day average blood glucose: ***  Libre3 CGM Download today *** % Time CGM is active: ***% Average Glucose: *** mg/dL Glucose Management Indicator: ***  Glucose Variability: ***% (goal <36%) Time in Goal:  - Time in range 70-180: ***% - Time above range: ***% - Time below range: ***% Observed patterns:  Lab Results  Component Value Date   HGBA1C 8.2 (A) 11/12/2023   There were no vitals filed for this visit.  Lipid Panel     Component Value Date/Time   CHOL 128 08/13/2023 0918   TRIG 153 (H) 08/13/2023 0918   HDL 35 (L) 08/13/2023 0918   CHOLHDL 3.4 12/16/2019 1140   CHOLHDL 3.6 08/08/2015 0845   VLDL 34 (H) 08/08/2015 0845   LDLCALC 66 08/13/2023 0918    Clinical Atherosclerotic Cardiovascular Disease (ASCVD): {YES/NO:21197} The ASCVD Risk score (Arnett DK, et al., 2019) failed to calculate for the following reasons:   The valid total cholesterol range is 130 to 320 mg/dL    A/P: Diabetes longstanding *** currently ***. Patient is *** able to verbalize appropriate hypoglycemia management plan. Medication adherence appears ***. Control is suboptimal due to ***. -{Meds adjust:18428} basal insulin  *** Lantus /Basaglar /Semglee  (insulin  glargine) *** Tresiba (insulin  degludec) from *** units to *** units daily in the morning. Patient will continue to titrate 1 unit every *** days if fasting blood sugar > 100mg /dl until fasting blood sugars reach goal or next visit.  -{Meds adjust:18428} rapid insulin  *** Novolog  (insulin  aspart) *** Humalog (insulin  lispro) from *** to ***.  -{Meds adjust:18428} GLP-1 *** Trulicity  (dulaglutide ) *** Ozempic (semaglutide) *** Mounjaro  (  tirzepatide ) from *** mg to *** mg .  -{Meds adjust:18428} SGLT2-I *** Farxiga  (dapagliflozin) *** Jardiance  (empagliflozin ) 10 mg. Counseled on sick day rules. -{Meds adjust:18428} metformin  ***.  -Patient educated on purpose, proper use, and potential adverse effects of ***.  -Extensively discussed pathophysiology of diabetes, recommended lifestyle interventions, dietary effects on blood sugar control.  -Counseled on s/sx of and management of hypoglycemia.  -Next A1c anticipated 01/2024.   ASCVD risk - primary prevention in patient with diabetes. Last LDL is at goal of <70 mg/dL. ASCVD risk factors include former smoker and diabetes high intensity statin indicated.  -Continued atorvastatin  40 mg daily. -Updated lipid panel needed at PCP appointment  Hypertension longstanding *** currently ***. Blood pressure goal of <130/80 *** mmHg. Medication adherence ***. Blood pressure control is suboptimal due to ***. -{Meds adjust:18428} *** mg.  Written patient instructions provided. Patient verbalized understanding of treatment plan.  Total time in face to face counseling *** minutes.    Follow-up:  Pharmacist *** PCP clinic visit 02/11/24 with Dr. Newlin

## 2024-01-20 ENCOUNTER — Other Ambulatory Visit: Payer: Self-pay | Admitting: Family Medicine

## 2024-01-20 ENCOUNTER — Ambulatory Visit: Attending: Family Medicine | Admitting: Pharmacist

## 2024-01-20 ENCOUNTER — Other Ambulatory Visit: Payer: Self-pay

## 2024-01-20 ENCOUNTER — Encounter: Payer: Self-pay | Admitting: Pharmacist

## 2024-01-20 DIAGNOSIS — E1165 Type 2 diabetes mellitus with hyperglycemia: Secondary | ICD-10-CM

## 2024-01-20 DIAGNOSIS — Z794 Long term (current) use of insulin: Secondary | ICD-10-CM | POA: Diagnosis not present

## 2024-01-20 DIAGNOSIS — Z7984 Long term (current) use of oral hypoglycemic drugs: Secondary | ICD-10-CM

## 2024-01-20 DIAGNOSIS — Z7985 Long-term (current) use of injectable non-insulin antidiabetic drugs: Secondary | ICD-10-CM | POA: Diagnosis not present

## 2024-01-20 DIAGNOSIS — K219 Gastro-esophageal reflux disease without esophagitis: Secondary | ICD-10-CM

## 2024-01-20 MED ORDER — LANTUS SOLOSTAR 100 UNIT/ML ~~LOC~~ SOPN
60.0000 [IU] | PEN_INJECTOR | Freq: Every day | SUBCUTANEOUS | 3 refills | Status: AC
  Filled 2024-01-20 – 2024-01-21 (×2): qty 30, 50d supply, fill #0

## 2024-01-20 MED ORDER — TIRZEPATIDE 10 MG/0.5ML ~~LOC~~ SOAJ
10.0000 mg | SUBCUTANEOUS | 0 refills | Status: DC
  Filled 2024-01-20: qty 6, 84d supply, fill #0

## 2024-01-20 MED ORDER — OMEPRAZOLE 20 MG PO CPDR
20.0000 mg | DELAYED_RELEASE_CAPSULE | Freq: Every day | ORAL | 1 refills | Status: DC
  Filled 2024-01-20 – 2024-02-12 (×2): qty 90, 90d supply, fill #0

## 2024-01-21 ENCOUNTER — Other Ambulatory Visit: Payer: Self-pay

## 2024-01-28 ENCOUNTER — Other Ambulatory Visit: Payer: Self-pay

## 2024-01-29 ENCOUNTER — Other Ambulatory Visit: Payer: Self-pay

## 2024-01-30 ENCOUNTER — Other Ambulatory Visit: Payer: Self-pay

## 2024-02-11 ENCOUNTER — Encounter: Payer: Self-pay | Admitting: Family Medicine

## 2024-02-11 ENCOUNTER — Ambulatory Visit: Payer: Self-pay | Attending: Family Medicine | Admitting: Family Medicine

## 2024-02-11 ENCOUNTER — Other Ambulatory Visit: Payer: Self-pay

## 2024-02-11 VITALS — BP 159/82 | HR 76 | Temp 97.9°F | Ht 71.5 in | Wt 277.4 lb

## 2024-02-11 DIAGNOSIS — I152 Hypertension secondary to endocrine disorders: Secondary | ICD-10-CM

## 2024-02-11 DIAGNOSIS — I10 Essential (primary) hypertension: Secondary | ICD-10-CM

## 2024-02-11 DIAGNOSIS — R1084 Generalized abdominal pain: Secondary | ICD-10-CM

## 2024-02-11 DIAGNOSIS — Z7985 Long-term (current) use of injectable non-insulin antidiabetic drugs: Secondary | ICD-10-CM | POA: Diagnosis not present

## 2024-02-11 DIAGNOSIS — E119 Type 2 diabetes mellitus without complications: Secondary | ICD-10-CM

## 2024-02-11 DIAGNOSIS — E1142 Type 2 diabetes mellitus with diabetic polyneuropathy: Secondary | ICD-10-CM

## 2024-02-11 DIAGNOSIS — T8090XD Unspecified complication following infusion and therapeutic injection, subsequent encounter: Secondary | ICD-10-CM

## 2024-02-11 DIAGNOSIS — E039 Hypothyroidism, unspecified: Secondary | ICD-10-CM

## 2024-02-11 DIAGNOSIS — Z7984 Long term (current) use of oral hypoglycemic drugs: Secondary | ICD-10-CM

## 2024-02-11 DIAGNOSIS — Z794 Long term (current) use of insulin: Secondary | ICD-10-CM

## 2024-02-11 DIAGNOSIS — M1A9XX0 Chronic gout, unspecified, without tophus (tophi): Secondary | ICD-10-CM | POA: Diagnosis not present

## 2024-02-11 DIAGNOSIS — T8090XA Unspecified complication following infusion and therapeutic injection, initial encounter: Secondary | ICD-10-CM | POA: Diagnosis not present

## 2024-02-11 LAB — POCT GLYCOSYLATED HEMOGLOBIN (HGB A1C): HbA1c, POC (controlled diabetic range): 7 % (ref 0.0–7.0)

## 2024-02-11 MED ORDER — GABAPENTIN 300 MG PO CAPS
300.0000 mg | ORAL_CAPSULE | Freq: Every day | ORAL | 3 refills | Status: AC
  Filled 2024-02-11: qty 30, 30d supply, fill #0

## 2024-02-11 MED ORDER — TRIAMCINOLONE ACETONIDE 0.1 % EX CREA
1.0000 | TOPICAL_CREAM | Freq: Two times a day (BID) | CUTANEOUS | 0 refills | Status: AC
  Filled 2024-02-11: qty 15, 30d supply, fill #0

## 2024-02-11 MED ORDER — CARVEDILOL 12.5 MG PO TABS
12.5000 mg | ORAL_TABLET | Freq: Two times a day (BID) | ORAL | 1 refills | Status: AC
  Filled 2024-02-11: qty 180, 90d supply, fill #0

## 2024-02-11 MED ORDER — ALLOPURINOL 300 MG PO TABS
300.0000 mg | ORAL_TABLET | Freq: Every day | ORAL | 1 refills | Status: AC
  Filled 2024-02-11: qty 90, 90d supply, fill #0

## 2024-02-11 NOTE — Patient Instructions (Signed)
 VISIT SUMMARY:  During today's visit, we discussed your ongoing health concerns, including your diabetes, hypertension, injection site reactions, thyroid  disease, gout, and gastrointestinal issues. We reviewed your current medications and made some adjustments to better manage your conditions. We also discussed general health maintenance and the importance of regular exercise and a low sodium diet.  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 Peripheral neuropathy, which is nerve damage often caused by diabetes, is likely causing the numbness in your fingers and legs. We have prescribed gabapentin  to help manage these symptoms. Please take it at bedtime.  -INJECTION SITE REACTION TO ANTIDIABETIC MEDICATION: You have been experiencing swelling, redness, and itching at the injection sites of Mounjaro . We will continue Mounjaro  until your current supply is finished, then switch you back to Ozempic 2 mg. In the meantime, use the prescribed triamcinolone  cream to help with the injection site reactions.  -HYPERTENSION: Your blood pressure is still high despite your current medications. Hypertension, or high blood pressure, can lead to serious health issues if not controlled. We have increased your losartan  to 320 mg daily and your carvedilol  to 12.5 mg twice daily. Please continue with a low sodium diet and regular exercise.  -ACQUIRED HYPOTHYROIDISM: Your thyroid  function tests were previously abnormal, indicating hypothyroidism, a condition where your thyroid  does not produce enough hormones. We have ordered new thyroid  function tests and will adjust your levothyroxine  dose based on the results. Continue taking 300 mcg daily on an empty stomach.  -CHRONIC GOUT: You have occasional stiffness and swelling in your knee due to gout, a type of arthritis caused by excess uric acid. We have refilled  your allopurinol  prescription to help manage this condition.  -GASTROESOPHAGEAL REFLUX DISEASE WITH HIATAL HERNIA AND CONSTIPATION: You are experiencing pain and discomfort, likely related to your hiatal hernia and constipation. Gastroesophageal reflux disease (GERD) occurs when stomach acid frequently flows back into the tube connecting your mouth and stomach. Please continue using laxatives like Miralax  and magnesium citrate regularly. We will proceed with your scheduled colonoscopy and endoscopy.  -GENERAL HEALTH MAINTENANCE: We discussed the importance of routine health maintenance, including regular eye exams and exercise. Please ensure you have regular eye exams with [facility] and continue with your exercise routine and low sodium diet.  INSTRUCTIONS:  Please follow up with the scheduled colonoscopy and endoscopy. We will also need to review your thyroid  function test results once they are available. Continue monitoring your blood pressure at home and keep a log of your readings. Schedule regular eye exams with [facility].
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
## 2024-02-12 ENCOUNTER — Other Ambulatory Visit: Payer: Self-pay | Admitting: Pharmacist

## 2024-02-12 ENCOUNTER — Other Ambulatory Visit: Payer: Self-pay

## 2024-02-12 LAB — T4, FREE: Free T4: 0.82 ng/dL (ref 0.82–1.77)

## 2024-02-12 LAB — T3: T3, Total: 95 ng/dL (ref 71–180)

## 2024-02-12 LAB — TSH: TSH: 2.62 u[IU]/mL (ref 0.450–4.500)

## 2024-02-12 NOTE — Progress Notes (Signed)
 Pharmacy Quality Measure Review  This patient is appearing on a report for being at risk of failing the adherence measure for cholesterol (statin) medications this calendar year.   Medication: atorvastatin  Last fill date: 11/06/2023 for 90 day supply.  Collaborated with pharmacy. Filled this along with several other refills. Called patient and he will try to come by tomorrow or Friday.  Herlene Fleeta Morris, PharmD, JAQUELINE, CPP Clinical Pharmacist [facility] [phone]

## 2024-02-13 ENCOUNTER — Other Ambulatory Visit: Payer: Self-pay

## 2024-02-13 ENCOUNTER — Ambulatory Visit: Payer: Self-pay | Admitting: Family Medicine

## 2024-02-13 DIAGNOSIS — E039 Hypothyroidism, unspecified: Secondary | ICD-10-CM

## 2024-02-13 MED ORDER — LEVOTHYROXINE SODIUM 300 MCG PO TABS: 300.0000 ug | ORAL_TABLET | Freq: Every day | ORAL | 1 refills | Status: AC

## 2024-02-18 ENCOUNTER — Encounter: Payer: Self-pay | Admitting: Gastroenterology

## 2024-02-18 ENCOUNTER — Other Ambulatory Visit: Payer: Self-pay

## 2024-02-18 ENCOUNTER — Ambulatory Visit: Admitting: Gastroenterology

## 2024-02-18 VITALS — BP 157/103 | HR 78 | Temp 97.5°F | Resp 15 | Ht 71.5 in | Wt 269.0 lb

## 2024-02-18 DIAGNOSIS — K2970 Gastritis, unspecified, without bleeding: Secondary | ICD-10-CM

## 2024-02-18 DIAGNOSIS — Z1211 Encounter for screening for malignant neoplasm of colon: Secondary | ICD-10-CM

## 2024-02-18 DIAGNOSIS — K5730 Diverticulosis of large intestine without perforation or abscess without bleeding: Secondary | ICD-10-CM | POA: Diagnosis not present

## 2024-02-18 DIAGNOSIS — K2950 Unspecified chronic gastritis without bleeding: Secondary | ICD-10-CM | POA: Diagnosis not present

## 2024-02-18 DIAGNOSIS — K648 Other hemorrhoids: Secondary | ICD-10-CM | POA: Diagnosis not present

## 2024-02-18 DIAGNOSIS — Z860101 Personal history of adenomatous and serrated colon polyps: Secondary | ICD-10-CM

## 2024-02-18 DIAGNOSIS — R1310 Dysphagia, unspecified: Secondary | ICD-10-CM | POA: Diagnosis not present

## 2024-02-18 DIAGNOSIS — K2940 Chronic atrophic gastritis without bleeding: Secondary | ICD-10-CM | POA: Diagnosis not present

## 2024-02-18 DIAGNOSIS — K644 Residual hemorrhoidal skin tags: Secondary | ICD-10-CM

## 2024-02-18 DIAGNOSIS — K1379 Other lesions of oral mucosa: Secondary | ICD-10-CM | POA: Diagnosis not present

## 2024-02-18 DIAGNOSIS — K219 Gastro-esophageal reflux disease without esophagitis: Secondary | ICD-10-CM

## 2024-02-18 DIAGNOSIS — K222 Esophageal obstruction: Secondary | ICD-10-CM | POA: Diagnosis not present

## 2024-02-18 DIAGNOSIS — R1319 Other dysphagia: Secondary | ICD-10-CM

## 2024-02-18 MED ORDER — SODIUM CHLORIDE 0.9 % IV SOLN: 500.0000 mL | INTRAVENOUS | Status: DC

## 2024-02-18 MED ORDER — OMEPRAZOLE 40 MG PO CPDR
40.0000 mg | DELAYED_RELEASE_CAPSULE | Freq: Every day | ORAL | 3 refills | Status: AC
  Filled 2024-02-18: qty 90, 90d supply, fill #0

## 2024-02-18 NOTE — Progress Notes (Signed)
 Called to room to assist during endoscopic procedure.  Patient ID and intended procedure confirmed with present staff. Received instructions for my participation in the procedure from the performing physician.

## 2024-02-18 NOTE — Op Note (Signed)
 [facility] Patient Name: Ray Garner Procedure Date: 02/18/2024 8:41 AM MRN: [id number] Endoscopist: Gustav ALONSO Mcgee , MD, [id number] Age: 68 Referring MD:  Date of Birth: 1955-08-23 Gender: Male Account #: [id number] Procedure:                Colonoscopy Indications:              High risk colon cancer surveillance: Personal                            history of adenoma less than 10 mm in size Medicines:                Monitored Anesthesia Care Procedure:                Pre-Anesthesia Assessment:                           - Prior to the procedure, a History and Physical                            was performed, and patient medications and                            allergies were reviewed. The patient's tolerance of                            previous anesthesia was also reviewed. The risks                            and benefits of the procedure and the sedation                            options and risks were discussed with the patient.                            All questions were answered, and informed consent                            was obtained. Prior Anticoagulants: The patient has                            taken no anticoagulant or antiplatelet agents. ASA                            Grade Assessment: II - A patient with mild systemic                            disease. After reviewing the risks and benefits,                            the patient was deemed in satisfactory condition to                            undergo the procedure.  After obtaining informed consent, the colonoscope                            was passed under direct vision. Throughout the                            procedure, the patient's blood pressure, pulse, and                            oxygen  saturations were monitored continuously. The                            Olympus Scope [id number] was introduced through the                            anus and  advanced to the the cecum, identified by                            appendiceal orifice and ileocecal valve. The                            colonoscopy was performed without difficulty. The                            patient tolerated the procedure well. The quality                            of the bowel preparation was adequate. The                            ileocecal valve, appendiceal orifice, and rectum                            were photographed. Scope In: 9:26:08 AM Scope Out: 9:42:58 AM Scope Withdrawal Time: 0 hours 9 minutes 37 seconds  Total Procedure Duration: 0 hours 16 minutes 50 seconds  Findings:                 The perianal and digital rectal examinations were                            normal.                           Scattered medium-mouthed and small-mouthed                            diverticula were found in the sigmoid colon,                            descending colon, transverse colon and ascending                            colon.  Non-bleeding external and internal hemorrhoids were                            found during retroflexion. The hemorrhoids were                            medium-sized. Complications:            No immediate complications. Estimated Blood Loss:     Estimated blood loss: none. Impression:               - Diverticulosis in the sigmoid colon, in the                            descending colon, in the transverse colon and in                            the ascending colon.                           - Non-bleeding external and internal hemorrhoids.                           - No specimens collected. Recommendation:           - Resume previous diet.                           - Continue present medications.                           - Repeat colonoscopy in 10 years for surveillance.                           - For future colonoscopy the patient will require                            an extended preparation. If  there are any                            questions, please contact the gastroenterologist. Gustav ALONSO Mcgee, MD 02/18/2024 9:52:09 AM This report has been signed electronically.
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
## 2024-02-18 NOTE — Op Note (Addendum)
 [facility] Patient Name: Ray Garner Procedure Date: 02/18/2024 8:48 AM MRN: [id number] Endoscopist: Gustav ALONSO Mcgee , MD, [id number] Age: 68 Referring MD:  Date of Birth: 24-Mar-1955 Gender: Male Account #: [id number] Procedure:                Upper GI endoscopy Indications:              Dysphagia, Epigastric abdominal pain Medicines:                Monitored Anesthesia Care Procedure:                Pre-Anesthesia Assessment:                           - Prior to the procedure, a History and Physical                            was performed, and patient medications and                            allergies were reviewed. The patient's tolerance of                            previous anesthesia was also reviewed. The risks                            and benefits of the procedure and the sedation                            options and risks were discussed with the patient.                            All questions were answered, and informed consent                            was obtained. Prior Anticoagulants: The patient has                            taken no anticoagulant or antiplatelet agents. ASA                            Grade Assessment: II - A patient with mild systemic                            disease. After reviewing the risks and benefits,                            the patient was deemed in satisfactory condition to                            undergo the procedure.                           After obtaining informed consent, the endoscope was  passed under direct vision. Throughout the                            procedure, the patient's blood pressure, pulse, and                            oxygen  saturations were monitored continuously. The                            Olympus scope [id number] was introduced through the                            mouth, and advanced to the second part of duodenum.                            The upper GI  endoscopy was accomplished without                            difficulty. The patient tolerated the procedure                            well. Scope In: Scope Out: Findings:                 Multilobulated polypoid lesion on the palate.                           One benign-appearing, intrinsic mild stenosis was                            found 38 to 39 cm from the incisors. This stenosis                            measured less than one cm (in length). The stenosis                            was traversed. A TTS dilator was passed through the                            scope. Dilation with an 18-19-20 mm x 8 cm CRE                            balloon dilator was performed to 20 mm. The                            dilation site was examined following endoscope                            reinsertion and showed mild mucosal disruption.                           The exam of the esophagus was otherwise normal.  Patchy mild inflammation characterized by                            congestion (edema), erythema and friability was                            found in the entire examined stomach. Biopsies were                            taken with a cold forceps for Helicobacter pylori                            testing.                           The cardia and gastric fundus were normal on                            retroflexion.                           The examined duodenum was normal. Complications:            No immediate complications. Estimated Blood Loss:     Estimated blood loss was minimal. Impression:               - Multilobulated polypoid lesion on the palate.                           - Benign-appearing esophageal stenosis. Dilated.                           - Gastritis. Biopsied.                           - Normal examined duodenum. Recommendation:           - Resume previous diet.                           - Continue present medications.                            - Await pathology results.                           - Use Prilosec (omeprazole ) 40 mg PO daily. Rx for                            90 days with 3 refills                           - Follow up with oral surgery for lesion on palate Gustav ALONSO Mcgee, MD 02/18/2024 9:55:57 AM This report has been signed electronically.

## 2024-02-18 NOTE — Patient Instructions (Addendum)
 Resume previous diet.  Continue present medications.   Use Prilosec (omeprazole ) 40 mg by mouth daily.   Repeat colonoscopy in 10 years for surveillance (with extended preparation).    YOU HAD AN ENDOSCOPIC PROCEDURE TODAY AT THE [facility]:   Refer to the procedure report that was given to you for any specific questions about what was found during the examination.  If the procedure report does not answer your questions, please call your gastroenterologist to clarify.  If you requested that your care partner not be given the details of your procedure findings, then the procedure report has been included in a sealed envelope for you to review at your convenience later.  YOU SHOULD EXPECT: Some feelings of bloating in the abdomen. Passage of more gas than usual.  Walking can help get rid of the air that was put into your GI tract during the procedure and reduce the bloating. If you had a lower endoscopy (such as a colonoscopy or flexible sigmoidoscopy) you may notice spotting of blood in your stool or on the toilet paper. If you underwent a bowel prep for your procedure, you may not have a normal bowel movement for a few days.  Please Note:  You might notice some irritation and congestion in your nose or some drainage.  This is from the oxygen  used during your procedure.  There is no need for concern and it should clear up in a day or so.  SYMPTOMS TO REPORT IMMEDIATELY:  Following lower endoscopy (colonoscopy or flexible sigmoidoscopy):  Excessive amounts of blood in the stool  Significant tenderness or worsening of abdominal pains  Swelling of the abdomen that is new, acute  Fever of 100F or higher  Following upper endoscopy (EGD)  Vomiting of blood or coffee ground material  New chest pain or pain under the shoulder blades  Painful or persistently difficult swallowing  New shortness of breath  Fever of 100F or higher  Black, tarry-looking stools  For urgent or emergent  issues, a gastroenterologist can be reached at any hour by calling [phone]. Do not use MyChart messaging for urgent concerns.    DIET:  We do recommend a small meal at first, but then you may proceed to your regular diet.  Drink plenty of fluids but you should avoid alcoholic beverages for 24 hours.  ACTIVITY:  You should plan to take it easy for the rest of today and you should NOT DRIVE or use heavy machinery until tomorrow (because of the sedation medicines used during the test).    FOLLOW UP: Our staff will call the number listed on your records the next business day following your procedure.  We will call around 7:15- 8:00 am to check on you and address any questions or concerns that you may have regarding the information given to you following your procedure. If we do not reach you, we will leave a message.     If any biopsies were taken you will be contacted by phone or by letter within the next 1-3 weeks.  Please call us  at [phone] if you have not heard about the biopsies in 3 weeks.    SIGNATURES/CONFIDENTIALITY: You and/or your care partner have signed paperwork which will be entered into your electronic medical record.  These signatures attest to the fact that that the information above on your After Visit Summary has been reviewed and is understood.  Full responsibility of the confidentiality of this discharge information lies with you and/or your  care-partner.

## 2024-02-18 NOTE — Progress Notes (Signed)
 Report to PACU, RN, vss, BBS= Clear.

## 2024-02-19 ENCOUNTER — Telehealth: Payer: Self-pay | Admitting: *Deleted

## 2024-02-19 NOTE — Telephone Encounter (Signed)
" °  Follow up Call-     02/18/2024    8:23 AM  Call back number  Post procedure Call Back phone  # [phone]  Permission to leave phone message Yes     Patient questions:  Do you have a fever, pain , or abdominal swelling? No. Pain Score  0 *  Have you tolerated food without any problems? Yes.    Have you been able to return to your normal activities? Yes.    Do you have any questions about your discharge instructions: Diet   No. Medications  No. Follow up visit  No.  Do you have questions or concerns about your Care? No.  Actions: * If pain score is 4 or above: No action needed, pain <4.   "

## 2024-02-26 LAB — SURGICAL PATHOLOGY

## 2024-03-12 ENCOUNTER — Ambulatory Visit: Payer: Self-pay | Admitting: Gastroenterology

## 2024-03-12 DIAGNOSIS — A048 Other specified bacterial intestinal infections: Secondary | ICD-10-CM

## 2024-03-17 NOTE — Progress Notes (Unsigned)
 "   S:    No chief complaint on file.  69 y.o. male who presents for diabetes evaluation, education, and management. PMH is significant for T2DM, HTN, hypothyroidism, gout.   Patient was referred by Primary Care Provider, Dr. Delbert, MD, on 11/12/23. Most recent visit with PCP on 02/11/24, where the patient reported continued injection site reactions to Mounjaro . He was instructed to finish his Mounjaro  supply and then switch to Ozempic 2 mg weekly. Last A1c on 02/11/24 of 7%, which is decreased from 8.2% on 11/12/23.   Pharmacy last saw patient on 01/20/24, where he reported injection site reactions to Mounjaro . His dose was reduced from 15 mg to 10 mg at that time.   Patient arrives today in good spirits and presents without any assistance. Patient states that he is doing well. ***  Lipid panel, if fasting How many doses of Mounjaro  left? Picked up a 84DS on 01/30/24.  Send in Rx of Ozempic?  Home BS Readings?   Family/Social History: Fhx: no pertinent positives Tobacco use: Former smoker (quit 1985)  Current diabetes medications include:  Jardiance  25 mg daily Metformin  1000 mg BID Lantus  54 units daily Mounjaro  10 mg [place] weekly (Mondays) --> eventually to transition to Ozempic 2 mg weekly Current hypertension medications include:  Amlodipine  10 mg daily Carvedilol  6.25 mg BID with meals Valsartan  160 mg daily Current hyperlipidemia medications include:  Atorvastatin  40 mg daily  Insurance coverage: [facility]  Patient denies hypoglycemic events. ***  Reported home fasting blood sugars: *** Reported 2 hour post-meal/random blood sugars: ***  Patient denies nocturia (nighttime urination).  Patient denies neuropathy (nerve pain). Patient denies visual changes. Patient reports self foot exams.   Patient reported dietary habits: Breakfast: green beans, ham biscuit  Lunch: green beans, roasted chicken, limits fast food, steak every now and then, fish, turkey Dinner:  limits starches Snacks: ritz crackers with cheese Drinks: water, cranberry juice, limits sodas, Gatorade every now and then   Patient-reported exercise habits: 20-30 minutes every other day  O:  Lab Results  Component Value Date   HGBA1C 7.0 02/11/2024   There were no vitals filed for this visit.  Lipid Panel     Component Value Date/Time   CHOL 128 08/13/2023 0918   TRIG 153 (H) 08/13/2023 0918   HDL 35 (L) 08/13/2023 0918   CHOLHDL 3.4 12/16/2019 1140   CHOLHDL 3.6 08/08/2015 0845   VLDL 34 (H) 08/08/2015 0845   LDLCALC 66 08/13/2023 0918    Clinical Atherosclerotic Cardiovascular Disease (ASCVD):  The ASCVD Risk score (Arnett DK, et al., 2019) failed to calculate for the following reasons:   The valid total cholesterol range is 130 to 320 mg/dL    A/P: Diabetes longstanding is currently controlled. Last A1c of 7%, which is decreased from 8.2%.*** Currently unsymptomatic patient is able to verbalize appropriate hypoglycemia management plan. *** Patient educated on purpose, proper use, and potential adverse effects of Mounjaro , Lantus , and Jardiance .  Extensively discussed pathophysiology of diabetes, recommended lifestyle interventions, dietary effects on blood sugar control.  Counseled on s/sx of and management of hypoglycemia.  Next A1c anticipated 04/2024   ASCVD risk - primary prevention in patient with diabetes. Last LDL is at goal of <70 mg/dL. ASCVD risk factors include former smoker and diabetes high intensity statin indicated.  Continued ***  Written patient instructions provided. Patient verbalized understanding of treatment plan.  Total time in face to face counseling 30 minutes.    Follow-up:  Pharmacist *** PCP  clinic visit *** with Dr. Newlin  ***  "

## 2024-03-18 ENCOUNTER — Other Ambulatory Visit: Payer: Self-pay

## 2024-03-18 MED ORDER — OMEPRAZOLE 20 MG PO CPDR
20.0000 mg | DELAYED_RELEASE_CAPSULE | Freq: Two times a day (BID) | ORAL | 0 refills | Status: AC
  Filled 2024-03-18: qty 28, 14d supply, fill #0

## 2024-03-18 MED ORDER — TETRACYCLINE HCL 500 MG PO CAPS
ORAL_CAPSULE | Freq: Four times a day (QID) | ORAL | 0 refills | Status: AC
  Filled 2024-03-18: qty 56, 14d supply, fill #0

## 2024-03-18 MED ORDER — BISMUTH SUBSALICYLATE 262 MG PO CHEW
524.0000 mg | CHEWABLE_TABLET | Freq: Four times a day (QID) | ORAL | 0 refills | Status: AC
  Filled 2024-03-18: qty 108, 14d supply, fill #0

## 2024-03-18 MED ORDER — METRONIDAZOLE 250 MG PO TABS
250.0000 mg | ORAL_TABLET | Freq: Four times a day (QID) | ORAL | 0 refills | Status: AC
  Filled 2024-03-18: qty 56, 14d supply, fill #0

## 2024-03-23 ENCOUNTER — Ambulatory Visit: Payer: Self-pay | Admitting: Pharmacist

## 2024-03-27 ENCOUNTER — Encounter: Payer: Self-pay | Admitting: Pharmacist

## 2024-03-27 ENCOUNTER — Ambulatory Visit: Attending: Family Medicine | Admitting: Pharmacist

## 2024-03-27 ENCOUNTER — Other Ambulatory Visit: Payer: Self-pay

## 2024-03-27 VITALS — BP 106/71 | HR 99 | Wt 264.2 lb

## 2024-03-27 DIAGNOSIS — Z7984 Long term (current) use of oral hypoglycemic drugs: Secondary | ICD-10-CM | POA: Diagnosis not present

## 2024-03-27 DIAGNOSIS — E1142 Type 2 diabetes mellitus with diabetic polyneuropathy: Secondary | ICD-10-CM

## 2024-03-27 DIAGNOSIS — I152 Hypertension secondary to endocrine disorders: Secondary | ICD-10-CM | POA: Diagnosis not present

## 2024-03-27 DIAGNOSIS — Z7985 Long-term (current) use of injectable non-insulin antidiabetic drugs: Secondary | ICD-10-CM

## 2024-03-27 DIAGNOSIS — Z794 Long term (current) use of insulin: Secondary | ICD-10-CM | POA: Diagnosis not present

## 2024-03-27 DIAGNOSIS — E1159 Type 2 diabetes mellitus with other circulatory complications: Secondary | ICD-10-CM

## 2024-03-27 MED ORDER — SEMAGLUTIDE (2 MG/DOSE) 8 MG/3ML ~~LOC~~ SOPN
2.0000 mg | PEN_INJECTOR | SUBCUTANEOUS | 6 refills | Status: AC
  Filled 2024-03-27: qty 3, 28d supply, fill #0

## 2024-04-03 ENCOUNTER — Other Ambulatory Visit: Payer: Self-pay

## 2024-05-11 ENCOUNTER — Ambulatory Visit: Payer: Self-pay | Admitting: Family Medicine

## 2024-06-25 ENCOUNTER — Ambulatory Visit: Payer: Self-pay | Admitting: Pharmacist

## 2024-10-13 ENCOUNTER — Ambulatory Visit
